# Patient Record
Sex: Female | Born: 1935
Health system: Southern US, Community
[De-identification: ages and names within clinical notes are randomized; demographics above are authoritative.]

## PROBLEM LIST (undated history)

## (undated) DIAGNOSIS — T7840XA Allergy, unspecified, initial encounter: Secondary | ICD-10-CM

## (undated) DIAGNOSIS — I341 Nonrheumatic mitral (valve) prolapse: Secondary | ICD-10-CM

## (undated) DIAGNOSIS — Z8619 Personal history of other infectious and parasitic diseases: Secondary | ICD-10-CM

## (undated) DIAGNOSIS — E871 Hypo-osmolality and hyponatremia: Secondary | ICD-10-CM

## (undated) DIAGNOSIS — G4700 Insomnia, unspecified: Secondary | ICD-10-CM

## (undated) DIAGNOSIS — C44611 Basal cell carcinoma of skin of unspecified upper limb, including shoulder: Secondary | ICD-10-CM

## (undated) DIAGNOSIS — D039 Melanoma in situ, unspecified: Secondary | ICD-10-CM

## (undated) DIAGNOSIS — E2839 Other primary ovarian failure: Secondary | ICD-10-CM

## (undated) DIAGNOSIS — T148XXA Other injury of unspecified body region, initial encounter: Secondary | ICD-10-CM

## (undated) DIAGNOSIS — R011 Cardiac murmur, unspecified: Secondary | ICD-10-CM

## (undated) DIAGNOSIS — Z659 Problem related to unspecified psychosocial circumstances: Secondary | ICD-10-CM

## (undated) DIAGNOSIS — D75839 Thrombocytosis, unspecified: Secondary | ICD-10-CM

## (undated) DIAGNOSIS — E7800 Pure hypercholesterolemia, unspecified: Secondary | ICD-10-CM

## (undated) HISTORY — DX: Hypo-osmolality and hyponatremia: E87.1

## (undated) HISTORY — DX: Personal history of other infectious and parasitic diseases: Z86.19

## (undated) HISTORY — DX: Other injury of unspecified body region, initial encounter: T14.8XXA

## (undated) HISTORY — DX: Insomnia, unspecified: G47.00

## (undated) HISTORY — DX: Other primary ovarian failure: E28.39

## (undated) HISTORY — DX: Pure hypercholesterolemia, unspecified: E78.00

## (undated) HISTORY — DX: Nonrheumatic mitral (valve) prolapse: I34.1

## (undated) HISTORY — PX: TONSILLECTOMY: SUR1361

## (undated) HISTORY — DX: Allergy, unspecified, initial encounter: T78.40XA

## (undated) HISTORY — DX: Basal cell carcinoma of skin of unspecified upper limb, including shoulder: C44.611

## (undated) HISTORY — DX: Cardiac murmur, unspecified: R01.1

## (undated) HISTORY — DX: Thrombocytosis, unspecified: D75.839

## (undated) HISTORY — DX: Melanoma in situ, unspecified: D03.9

## (undated) HISTORY — PX: EYE SURGERY: SHX253

## (undated) HISTORY — DX: Problem related to unspecified psychosocial circumstances: Z65.9

---

## 1984-05-30 HISTORY — PX: DILATION AND CURETTAGE OF UTERUS: SHX78

## 1986-06-28 HISTORY — PX: DILATION AND CURETTAGE OF UTERUS: SHX78

## 1987-05-31 HISTORY — PX: TOTAL ABDOMINAL HYSTERECTOMY W/ BILATERAL SALPINGOOPHORECTOMY: SHX83

## 2000-03-28 ENCOUNTER — Other Ambulatory Visit: Admission: RE | Admit: 2000-03-28 | Discharge: 2000-03-28 | Payer: Self-pay | Admitting: Obstetrics and Gynecology

## 2001-04-06 ENCOUNTER — Encounter: Payer: Self-pay | Admitting: Obstetrics and Gynecology

## 2001-04-06 ENCOUNTER — Encounter: Admission: RE | Admit: 2001-04-06 | Discharge: 2001-04-06 | Payer: Self-pay | Admitting: Obstetrics and Gynecology

## 2004-07-09 ENCOUNTER — Ambulatory Visit (HOSPITAL_COMMUNITY): Admission: RE | Admit: 2004-07-09 | Discharge: 2004-07-09 | Payer: Self-pay | Admitting: Chiropractic Medicine

## 2006-04-29 DIAGNOSIS — C44611 Basal cell carcinoma of skin of unspecified upper limb, including shoulder: Secondary | ICD-10-CM

## 2006-04-29 HISTORY — DX: Basal cell carcinoma of skin of unspecified upper limb, including shoulder: C44.611

## 2007-04-30 DIAGNOSIS — D039 Melanoma in situ, unspecified: Secondary | ICD-10-CM

## 2007-04-30 HISTORY — DX: Melanoma in situ, unspecified: D03.9

## 2007-10-01 ENCOUNTER — Other Ambulatory Visit: Admission: RE | Admit: 2007-10-01 | Discharge: 2007-10-01 | Payer: Self-pay | Admitting: Obstetrics & Gynecology

## 2012-07-09 ENCOUNTER — Other Ambulatory Visit: Payer: Self-pay | Admitting: Gynecology

## 2012-07-15 LAB — HEMOGLOBIN, FINGERSTICK: Hemoglobin, fingerstick: 13.4 g/dL (ref 12.0–16.0)

## 2012-08-10 ENCOUNTER — Ambulatory Visit: Payer: Medicare Other | Attending: Orthopaedic Surgery

## 2012-08-10 DIAGNOSIS — M79609 Pain in unspecified limb: Secondary | ICD-10-CM | POA: Insufficient documentation

## 2012-08-10 DIAGNOSIS — IMO0001 Reserved for inherently not codable concepts without codable children: Secondary | ICD-10-CM | POA: Insufficient documentation

## 2012-08-10 DIAGNOSIS — R5381 Other malaise: Secondary | ICD-10-CM | POA: Insufficient documentation

## 2012-08-10 DIAGNOSIS — M545 Low back pain, unspecified: Secondary | ICD-10-CM | POA: Insufficient documentation

## 2012-08-12 ENCOUNTER — Ambulatory Visit: Payer: Medicare Other

## 2012-08-17 ENCOUNTER — Ambulatory Visit: Payer: Medicare Other

## 2012-08-20 ENCOUNTER — Ambulatory Visit: Payer: Medicare Other | Admitting: Physical Therapy

## 2012-08-24 ENCOUNTER — Ambulatory Visit: Payer: Medicare Other | Admitting: Physical Therapy

## 2012-08-27 ENCOUNTER — Ambulatory Visit: Payer: Medicare Other | Attending: Orthopaedic Surgery | Admitting: Physical Therapy

## 2012-08-27 DIAGNOSIS — IMO0001 Reserved for inherently not codable concepts without codable children: Secondary | ICD-10-CM | POA: Insufficient documentation

## 2012-08-27 DIAGNOSIS — R5381 Other malaise: Secondary | ICD-10-CM | POA: Insufficient documentation

## 2012-08-27 DIAGNOSIS — M545 Low back pain, unspecified: Secondary | ICD-10-CM | POA: Insufficient documentation

## 2012-08-27 DIAGNOSIS — M25569 Pain in unspecified knee: Secondary | ICD-10-CM | POA: Insufficient documentation

## 2012-08-31 ENCOUNTER — Ambulatory Visit: Payer: Medicare Other | Admitting: Physical Therapy

## 2012-09-03 ENCOUNTER — Ambulatory Visit: Payer: Medicare Other | Admitting: Physical Therapy

## 2012-09-07 ENCOUNTER — Ambulatory Visit: Payer: Medicare Other | Admitting: Physical Therapy

## 2012-09-10 ENCOUNTER — Ambulatory Visit: Payer: Medicare Other

## 2012-09-14 ENCOUNTER — Ambulatory Visit: Payer: Medicare Other | Admitting: Physical Therapy

## 2012-09-17 ENCOUNTER — Ambulatory Visit: Payer: Medicare Other

## 2013-07-09 ENCOUNTER — Encounter: Payer: Self-pay | Admitting: Gynecology

## 2013-07-12 ENCOUNTER — Encounter: Payer: Self-pay | Admitting: Gynecology

## 2013-07-12 ENCOUNTER — Ambulatory Visit (INDEPENDENT_AMBULATORY_CARE_PROVIDER_SITE_OTHER): Payer: Medicare Other | Admitting: Gynecology

## 2013-07-12 VITALS — BP 140/90 | HR 74 | Resp 14 | Ht 66.0 in | Wt 167.0 lb

## 2013-07-12 DIAGNOSIS — Z124 Encounter for screening for malignant neoplasm of cervix: Secondary | ICD-10-CM

## 2013-07-12 DIAGNOSIS — Z01419 Encounter for gynecological examination (general) (routine) without abnormal findings: Secondary | ICD-10-CM | POA: Diagnosis not present

## 2013-07-12 NOTE — Progress Notes (Signed)
80 y.o. Widowed  Caucasian female   G0P0000 here for annual exam.  She does not report post-menopasual bleeding due to Hysterectomy.  Pt is without complains she is still doing PT for her back.   Patient's last menstrual period was 05/31/1987.          Sexually active: no  The current method of family planning is status post hysterectomy.    Exercising: no  The patient does not participate in regular exercise at present. Last pap: 10/01/2007 Negative Abnormal PAP: no Mammogram: 12/14/09 BSE: no Colonoscopy: 10/20/08 f/u q 5 years ago DEXA: 02/09/10 normal Alcohol: no Tobacco: no  Labs: Dibas Koirala, MD   Health Maintenance  Topic Date Due  . Tetanus/tdap  05/20/1950  . Colonoscopy  05/20/1981  . Zostavax  05/21/1991  . Pneumococcal Polysaccharide Vaccine Age 11 And Over  05/20/1996  . Influenza Vaccine  11/27/2012    History reviewed. No pertinent family history.  There are no active problems to display for this patient.   Past Medical History  Diagnosis Date  . Melanoma in situ 2009  . BCC (basal cell carcinoma), arm 2008    Left     Past Surgical History  Procedure Laterality Date  . Total abdominal hysterectomy w/ bilateral salpingoophorectomy  05/1987    Leiomyoma  . Dilation and curettage of uterus  05/1984    Prolif  . Dilation and curettage of uterus  06/1986    benign    Allergies: Codeine; Pneumococcal vaccines; Pseudoephedrine; and Tetanus toxoids  Current Outpatient Prescriptions  Medication Sig Dispense Refill  . Ascorbic Acid (VITAMIN C) 1000 MG tablet Take 1,000 mg by mouth daily.      . Calcium Carbonate-Vitamin D (CALCIUM + D PO) Take 1,200 mg by mouth.      . Cholecalciferol (VITAMIN D3) 400 UNITS CAPS Take by mouth.      . Digestive Enzymes (BETAINE HCL) 650-130 MG CAPS Take by mouth.      . Loratadine (CLARITIN PO) Take by mouth as needed.      . Multiple Vitamins-Minerals (MULTIVITAMIN PO) Take by mouth.      . Omega-3 Fatty Acids (FISH OIL)  1200 MG CAPS Take by mouth.       No current facility-administered medications for this visit.    ROS: Pertinent items are noted in HPI.  Exam:    BP 140/90  Pulse 74  Resp 14  Ht 5\' 6"  (1.676 m)  Wt 167 lb (75.751 kg)  BMI 26.97 kg/m2  LMP 05/31/1987 Weight change: @WEIGHTCHANGE @ Last 3 height recordings:  Ht Readings from Last 3 Encounters:  07/12/13 5\' 6"  (1.676 m)   General appearance: alert, cooperative and appears stated age Head: Normocephalic, without obvious abnormality, atraumatic Neck: no adenopathy, no carotid bruit, no JVD, supple, symmetrical, trachea midline and thyroid not enlarged, symmetric, no tenderness/mass/nodules Lungs: clear to auscultation bilaterally Breasts: normal appearance, no masses or tenderness Heart: regular rate and rhythm, S1, S2 normal, no murmur, click, rub or gallop Abdomen: soft, non-tender; bowel sounds normal; no masses,  no organomegaly Extremities: extremities normal, atraumatic, no cyanosis or edema Skin: Skin color, texture, turgor normal. No rashes or lesions Lymph nodes: Cervical, supraclavicular, and axillary nodes normal. no inguinal nodes palpated Neurologic: Grossly normal   Pelvic: External genitalia:  no lesions              Urethra: normal appearing urethra with no masses, tenderness or lesions  Bartholins and Skenes: Bartholin's, Urethra, Skene's normal                 Vagina: atrophic              Cervix: absent              Pap taken: no        Bimanual Exam:  Uterus:  absent                                      Adnexa:    surgically absent bilateral                                      Rectovaginal: Confirms                                      Anus:  defer exam  A: well woman      P: mammogram counseled on breast self exam, mammography screening, menopause, adequate intake of calcium and vitamin D, diet and exercise DEXA [facility] Labs with PCP return annually or prn Discussed PAP guideline  changes, importance of weight bearing exercises, calcium, vit D and balanced diet.  An After Visit Summary was printed and given to the patient.

## 2013-07-12 NOTE — Patient Instructions (Signed)

## 2013-07-19 DIAGNOSIS — Z780 Asymptomatic menopausal state: Secondary | ICD-10-CM | POA: Diagnosis not present

## 2013-07-23 ENCOUNTER — Telehealth: Payer: Self-pay | Admitting: *Deleted

## 2013-07-23 NOTE — Telephone Encounter (Signed)
Patient is calling Cathy Silva back °

## 2013-07-23 NOTE — Telephone Encounter (Signed)
Left Message To Call Back  

## 2013-07-23 NOTE — Telephone Encounter (Signed)
Patient notified of Bmd results; that everything looks great and to continue her diet, calcium and vitamin d as she has been.  Document sent for scanning  Routed to provider, encounter closed.

## 2013-11-10 DIAGNOSIS — D239 Other benign neoplasm of skin, unspecified: Secondary | ICD-10-CM | POA: Diagnosis not present

## 2013-11-10 DIAGNOSIS — D1801 Hemangioma of skin and subcutaneous tissue: Secondary | ICD-10-CM | POA: Diagnosis not present

## 2013-11-10 DIAGNOSIS — Z85820 Personal history of malignant melanoma of skin: Secondary | ICD-10-CM | POA: Diagnosis not present

## 2013-11-10 DIAGNOSIS — L821 Other seborrheic keratosis: Secondary | ICD-10-CM | POA: Diagnosis not present

## 2014-01-26 DIAGNOSIS — Z23 Encounter for immunization: Secondary | ICD-10-CM | POA: Diagnosis not present

## 2014-07-20 ENCOUNTER — Ambulatory Visit (INDEPENDENT_AMBULATORY_CARE_PROVIDER_SITE_OTHER): Payer: Medicare Other | Admitting: Certified Nurse Midwife

## 2014-07-20 ENCOUNTER — Ambulatory Visit: Payer: Self-pay | Admitting: Gynecology

## 2014-07-20 ENCOUNTER — Encounter: Payer: Self-pay | Admitting: Certified Nurse Midwife

## 2014-07-20 VITALS — BP 120/78 | HR 68 | Resp 16 | Ht 64.5 in | Wt 166.0 lb

## 2014-07-20 DIAGNOSIS — Z01419 Encounter for gynecological examination (general) (routine) without abnormal findings: Secondary | ICD-10-CM | POA: Diagnosis not present

## 2014-07-20 DIAGNOSIS — Z124 Encounter for screening for malignant neoplasm of cervix: Secondary | ICD-10-CM | POA: Diagnosis not present

## 2014-07-20 NOTE — Progress Notes (Signed)
81 y.o. G0P0000 Widowed  Caucasian Fe here for annual exam. Menopausal no HRT. Denies vaginal bleeding or vaginal dryness. Sees PCP for aex and labs.  Patient still driving and traveling. Ambulates with out difficulty. No medications other than OTC supplements, limited. No health issues or concerns today. Went to [place] and [place] recently!  Patient's last menstrual period was 05/31/1987.          Sexually active: No.  The current method of family planning is status post hysterectomy.  TAH with BSO  Exercising: No.  exercise Smoker:  no  Health Maintenance: Pap: 10-01-07 neg MMG: 12-14-09 needs now Colonoscopy:  2010 f/u 87yrs BMD:   07-19-13 TDaP:  1982, pt has reaction to lumps Labs: none Self breast exam: not done   reports that she has never smoked. She does not have any smokeless tobacco history on file. She reports that she does not drink alcohol or use illicit drugs.  Past Medical History  Diagnosis Date  . Melanoma in situ 2009  . BCC (basal cell carcinoma), arm 2008    Left     Past Surgical History  Procedure Laterality Date  . Total abdominal hysterectomy w/ bilateral salpingoophorectomy  05/1987    Leiomyoma  . Dilation and curettage of uterus  05/1984    Prolif  . Dilation and curettage of uterus  06/1986    benign    Current Outpatient Prescriptions  Medication Sig Dispense Refill  . Ascorbic Acid (VITAMIN C) 1000 MG tablet Take 1,000 mg by mouth daily.    . Calcium Carbonate-Vitamin D (CALCIUM + D PO) Take 1,200 mg by mouth.    . Digestive Enzymes (BETAINE HCL) 650-130 MG CAPS Take by mouth.    . Loratadine (CLARITIN PO) Take by mouth as needed.    . Multiple Vitamins-Minerals (MULTIVITAMIN PO) Take by mouth.    . Omega-3 Fatty Acids (FISH OIL) 1200 MG CAPS Take by mouth.     No current facility-administered medications for this visit.    History reviewed. No pertinent family history.  ROS:  Pertinent items are noted in HPI.  Otherwise, a comprehensive  ROS was negative.  Exam:   BP 120/78 mmHg  Pulse 68  Resp 16  Ht 5' 4.5" (1.638 m)  Wt 166 lb (75.297 kg)  BMI 28.06 kg/m2  LMP 05/31/1987 Height: 5' 4.5" (163.8 cm) Ht Readings from Last 3 Encounters:  07/20/14 5' 4.5" (1.638 m)  07/12/13 5\' 6"  (1.676 m)    General appearance: alert, cooperative and appears stated age Head: Normocephalic, without obvious abnormality, atraumatic Neck: no adenopathy, supple, symmetrical, trachea midline and thyroid normal to inspection and palpation Lungs: clear to auscultation bilaterally Breasts: normal appearance, no masses or tenderness, No nipple retraction or dimpling, No nipple discharge or bleeding, No axillary or supraclavicular adenopathy Heart: regular rate and rhythm Abdomen: soft, non-tender; no masses,  no organomegaly Extremities: extremities normal, atraumatic, no cyanosis or edema Skin: Skin color, texture, turgor normal. No rashes or lesions Lymph nodes: Cervical, supraclavicular, and axillary nodes normal. No abnormal inguinal nodes palpated Neurologic: Grossly normal   Pelvic: External genitalia:  no lesions              Urethra:  normal appearing urethra with no masses, tenderness or lesions              Bartholin's and Skene's: normal                 Vagina: normal appearing vagina with normal color  and discharge, no lesions, no atrophy              Cervix: absent              Pap taken: No. Bimanual Exam:  Uterus:  uterus absent              Adnexa: no mass, fullness, tenderness and adnexa absent bilateral               Rectovaginal: Confirms               Anus:  normal sphincter tone, no lesions  Chaperone present: Yes  A:  Well Woman with normal exam  Menopausal no HRT S/P TAH BSO for fibroids  P:   Reviewed health and wellness pertinent to exam  Aware if vaginal bleeding or dryness issues to advise  Stressed importance of PCP follow up as needed  Pap smear not  taken today   counseled on breast self exam,  mammography screening, given information to schedule and recommended yearly, patient declined our scheduling for her, adequate intake of calcium and vitamin D, diet and exercise, Kegel's exercises  return annually or prn  An After Visit Summary was printed and given to the patient.

## 2014-07-20 NOTE — Patient Instructions (Signed)

## 2014-07-21 ENCOUNTER — Telehealth: Payer: Self-pay | Admitting: Certified Nurse Midwife

## 2014-07-21 NOTE — Telephone Encounter (Signed)
I recommended she have one this year

## 2014-07-21 NOTE — Telephone Encounter (Signed)
Patient was seen 07/20/14 and was told her last MMG was 2009. Patient says this is incorrect that her last MMG was 2011.

## 2014-07-21 NOTE — Telephone Encounter (Signed)
Spoke with patient. Advised patient of message as seen below from Regina Eck CNM. Patient is agreeable and will call to schedule mammogram at this time. Offered to call and schedule for patient but patient declines at this time.  Routing to provider for final review. Patient agreeable to disposition. Will close encounter

## 2014-07-21 NOTE — Telephone Encounter (Signed)
Spoke with patient. Patient states her most recent mammogram was in 2011 with [facility]. "When I had that one done Dr.Miller told me she would check me every year and if she thought I needed one she would tell me. She has not mentioned it since then so I have not had one. I am not sure if I should still go get one." Advised patient will speak with Cathy Silva CNM and return call with further recommendations. Patient is agreeable.

## 2014-07-27 NOTE — Progress Notes (Signed)
Reviewed personally.  M. Suzanne Jb Dulworth, MD.  

## 2014-08-03 DIAGNOSIS — Z1231 Encounter for screening mammogram for malignant neoplasm of breast: Secondary | ICD-10-CM | POA: Diagnosis not present

## 2014-11-28 DIAGNOSIS — D2262 Melanocytic nevi of left upper limb, including shoulder: Secondary | ICD-10-CM | POA: Diagnosis not present

## 2014-11-28 DIAGNOSIS — L814 Other melanin hyperpigmentation: Secondary | ICD-10-CM | POA: Diagnosis not present

## 2014-11-28 DIAGNOSIS — L570 Actinic keratosis: Secondary | ICD-10-CM | POA: Diagnosis not present

## 2014-11-28 DIAGNOSIS — D1801 Hemangioma of skin and subcutaneous tissue: Secondary | ICD-10-CM | POA: Diagnosis not present

## 2014-11-28 DIAGNOSIS — D2271 Melanocytic nevi of right lower limb, including hip: Secondary | ICD-10-CM | POA: Diagnosis not present

## 2015-02-20 DIAGNOSIS — Z23 Encounter for immunization: Secondary | ICD-10-CM | POA: Diagnosis not present

## 2015-02-21 DIAGNOSIS — H02839 Dermatochalasis of unspecified eye, unspecified eyelid: Secondary | ICD-10-CM | POA: Diagnosis not present

## 2015-02-21 DIAGNOSIS — H2511 Age-related nuclear cataract, right eye: Secondary | ICD-10-CM | POA: Diagnosis not present

## 2015-02-21 DIAGNOSIS — H18412 Arcus senilis, left eye: Secondary | ICD-10-CM | POA: Diagnosis not present

## 2015-02-21 DIAGNOSIS — H18411 Arcus senilis, right eye: Secondary | ICD-10-CM | POA: Diagnosis not present

## 2015-04-17 DIAGNOSIS — H25011 Cortical age-related cataract, right eye: Secondary | ICD-10-CM | POA: Diagnosis not present

## 2015-04-17 DIAGNOSIS — H25811 Combined forms of age-related cataract, right eye: Secondary | ICD-10-CM | POA: Diagnosis not present

## 2015-04-17 DIAGNOSIS — H2511 Age-related nuclear cataract, right eye: Secondary | ICD-10-CM | POA: Diagnosis not present

## 2015-04-18 DIAGNOSIS — H2512 Age-related nuclear cataract, left eye: Secondary | ICD-10-CM | POA: Diagnosis not present

## 2015-05-05 DIAGNOSIS — H5203 Hypermetropia, bilateral: Secondary | ICD-10-CM | POA: Diagnosis not present

## 2015-05-05 DIAGNOSIS — H52223 Regular astigmatism, bilateral: Secondary | ICD-10-CM | POA: Diagnosis not present

## 2015-05-05 DIAGNOSIS — Z961 Presence of intraocular lens: Secondary | ICD-10-CM | POA: Diagnosis not present

## 2015-05-05 DIAGNOSIS — H25812 Combined forms of age-related cataract, left eye: Secondary | ICD-10-CM | POA: Diagnosis not present

## 2015-05-05 DIAGNOSIS — Z9849 Cataract extraction status, unspecified eye: Secondary | ICD-10-CM | POA: Diagnosis not present

## 2015-05-05 DIAGNOSIS — H2513 Age-related nuclear cataract, bilateral: Secondary | ICD-10-CM | POA: Diagnosis not present

## 2015-05-05 DIAGNOSIS — H2512 Age-related nuclear cataract, left eye: Secondary | ICD-10-CM | POA: Diagnosis not present

## 2015-05-07 ENCOUNTER — Encounter (HOSPITAL_COMMUNITY): Payer: Self-pay | Admitting: Emergency Medicine

## 2015-05-07 ENCOUNTER — Emergency Department (HOSPITAL_COMMUNITY): Payer: Medicare Other

## 2015-05-07 ENCOUNTER — Inpatient Hospital Stay (HOSPITAL_COMMUNITY)
Admission: EM | Admit: 2015-05-07 | Discharge: 2015-05-12 | DRG: 641 | Disposition: A | Discharge status: Home / Self Care | Payer: Medicare Other | Attending: Internal Medicine | Admitting: Internal Medicine

## 2015-05-07 DIAGNOSIS — A084 Viral intestinal infection, unspecified: Secondary | ICD-10-CM | POA: Diagnosis present

## 2015-05-07 DIAGNOSIS — Z85820 Personal history of malignant melanoma of skin: Secondary | ICD-10-CM

## 2015-05-07 DIAGNOSIS — Z887 Allergy status to serum and vaccine status: Secondary | ICD-10-CM

## 2015-05-07 DIAGNOSIS — R531 Weakness: Secondary | ICD-10-CM | POA: Diagnosis not present

## 2015-05-07 DIAGNOSIS — Z9842 Cataract extraction status, left eye: Secondary | ICD-10-CM

## 2015-05-07 DIAGNOSIS — M4854XA Collapsed vertebra, not elsewhere classified, thoracic region, initial encounter for fracture: Secondary | ICD-10-CM | POA: Diagnosis present

## 2015-05-07 DIAGNOSIS — K449 Diaphragmatic hernia without obstruction or gangrene: Secondary | ICD-10-CM | POA: Diagnosis present

## 2015-05-07 DIAGNOSIS — I10 Essential (primary) hypertension: Secondary | ICD-10-CM

## 2015-05-07 DIAGNOSIS — R0602 Shortness of breath: Secondary | ICD-10-CM | POA: Diagnosis not present

## 2015-05-07 DIAGNOSIS — K224 Dyskinesia of esophagus: Secondary | ICD-10-CM | POA: Diagnosis present

## 2015-05-07 DIAGNOSIS — M549 Dorsalgia, unspecified: Secondary | ICD-10-CM

## 2015-05-07 DIAGNOSIS — R112 Nausea with vomiting, unspecified: Secondary | ICD-10-CM | POA: Diagnosis not present

## 2015-05-07 DIAGNOSIS — Z886 Allergy status to analgesic agent status: Secondary | ICD-10-CM

## 2015-05-07 DIAGNOSIS — R1319 Other dysphagia: Secondary | ICD-10-CM | POA: Diagnosis present

## 2015-05-07 DIAGNOSIS — Z90722 Acquired absence of ovaries, bilateral: Secondary | ICD-10-CM

## 2015-05-07 DIAGNOSIS — R404 Transient alteration of awareness: Secondary | ICD-10-CM | POA: Diagnosis not present

## 2015-05-07 DIAGNOSIS — E871 Hypo-osmolality and hyponatremia: Principal | ICD-10-CM | POA: Diagnosis present

## 2015-05-07 DIAGNOSIS — Z8249 Family history of ischemic heart disease and other diseases of the circulatory system: Secondary | ICD-10-CM

## 2015-05-07 DIAGNOSIS — R911 Solitary pulmonary nodule: Secondary | ICD-10-CM | POA: Diagnosis present

## 2015-05-07 DIAGNOSIS — Z90710 Acquired absence of both cervix and uterus: Secondary | ICD-10-CM

## 2015-05-07 DIAGNOSIS — Z888 Allergy status to other drugs, medicaments and biological substances status: Secondary | ICD-10-CM

## 2015-05-07 DIAGNOSIS — Z79899 Other long term (current) drug therapy: Secondary | ICD-10-CM

## 2015-05-07 DIAGNOSIS — R1310 Dysphagia, unspecified: Secondary | ICD-10-CM

## 2015-05-07 DIAGNOSIS — E222 Syndrome of inappropriate secretion of antidiuretic hormone: Secondary | ICD-10-CM | POA: Diagnosis not present

## 2015-05-07 DIAGNOSIS — Z9841 Cataract extraction status, right eye: Secondary | ICD-10-CM

## 2015-05-07 DIAGNOSIS — R1110 Vomiting, unspecified: Secondary | ICD-10-CM | POA: Diagnosis not present

## 2015-05-07 DIAGNOSIS — K210 Gastro-esophageal reflux disease with esophagitis: Secondary | ICD-10-CM | POA: Diagnosis present

## 2015-05-07 LAB — I-STAT CHEM 8, ED
BUN: 7 mg/dL (ref 6–20)
CHLORIDE: 90 mmol/L — AB (ref 101–111)
CREATININE: 0.4 mg/dL — AB (ref 0.44–1.00)
Calcium, Ion: 0.99 mmol/L — ABNORMAL LOW (ref 1.13–1.30)
GLUCOSE: 125 mg/dL — AB (ref 65–99)
HCT: 43 % (ref 36.0–46.0)
Hemoglobin: 14.6 g/dL (ref 12.0–15.0)
POTASSIUM: 3.5 mmol/L (ref 3.5–5.1)
Sodium: 124 mmol/L — ABNORMAL LOW (ref 135–145)
TCO2: 21 mmol/L (ref 0–100)

## 2015-05-07 LAB — BASIC METABOLIC PANEL
ANION GAP: 11 (ref 5–15)
Anion gap: 14 (ref 5–15)
BUN: 7 mg/dL (ref 6–20)
BUN: 7 mg/dL (ref 6–20)
CHLORIDE: 88 mmol/L — AB (ref 101–111)
CHLORIDE: 93 mmol/L — AB (ref 101–111)
CO2: 19 mmol/L — AB (ref 22–32)
CO2: 20 mmol/L — ABNORMAL LOW (ref 22–32)
Calcium: 8.7 mg/dL — ABNORMAL LOW (ref 8.9–10.3)
Calcium: 8.4 mg/dL — ABNORMAL LOW (ref 8.9–10.3)
Creatinine, Ser: 0.52 mg/dL (ref 0.44–1.00)
Creatinine, Ser: 0.61 mg/dL (ref 0.44–1.00)
GFR calc non Af Amer: >60 mL/min (ref >60)
GFR calc non Af Amer: >60 mL/min (ref >60)
Glucose, Bld: 124 mg/dL — ABNORMAL HIGH (ref 65–99)
Glucose, Bld: 117 mg/dL — ABNORMAL HIGH (ref 65–99)
POTASSIUM: 3.6 mmol/L (ref 3.5–5.1)
POTASSIUM: 3.6 mmol/L (ref 3.5–5.1)
SODIUM: 121 mmol/L — AB (ref 135–145)
SODIUM: 124 mmol/L — AB (ref 135–145)

## 2015-05-07 LAB — CBC
HEMATOCRIT: 37.7 % (ref 36.0–46.0)
Hemoglobin: 12.9 g/dL (ref 12.0–15.0)
MCH: 27.4 pg (ref 26.0–34.0)
MCHC: 34.2 g/dL (ref 30.0–36.0)
MCV: 80.2 fL (ref 78.0–100.0)
Platelets: 379 10*3/uL (ref 150–400)
RBC: 4.7 MIL/uL (ref 3.87–5.11)
RDW: 13.1 % (ref 11.5–15.5)
WBC: 11.8 10*3/uL — AB (ref 4.0–10.5)

## 2015-05-07 LAB — HEPATIC FUNCTION PANEL
ALT: 15 U/L (ref 14–54)
AST: 21 U/L (ref 15–41)
Albumin: 3.4 g/dL — ABNORMAL LOW (ref 3.5–5.0)
Alkaline Phosphatase: 71 U/L (ref 38–126)
Bilirubin, Direct: 0.2 mg/dL (ref 0.1–0.5)
Indirect Bilirubin: 0.7 mg/dL (ref 0.3–0.9)
Total Bilirubin: 0.9 mg/dL (ref 0.3–1.2)
Total Protein: 6.9 g/dL (ref 6.5–8.1)

## 2015-05-07 LAB — URINE MICROSCOPIC-ADD ON
BACTERIA UA: NONE SEEN
WBC UA: NONE SEEN WBC/hpf (ref 0–5)

## 2015-05-07 LAB — URINALYSIS, ROUTINE W REFLEX MICROSCOPIC
Bilirubin Urine: NEGATIVE
Glucose, UA: NEGATIVE mg/dL
KETONES UR: 40 mg/dL — AB
LEUKOCYTES UA: NEGATIVE
NITRITE: NEGATIVE
PH: 7 (ref 5.0–8.0)
Protein, ur: 100 mg/dL — AB
SPECIFIC GRAVITY, URINE: 1.029 (ref 1.005–1.030)

## 2015-05-07 LAB — I-STAT TROPONIN, ED: Troponin i, poc: 0 ng/mL (ref 0.00–0.08)

## 2015-05-07 LAB — LIPASE, BLOOD: Lipase: 22 U/L (ref 11–51)

## 2015-05-07 LAB — I-STAT CG4 LACTIC ACID, ED
Lactic Acid, Venous: 1.7 mmol/L (ref 0.5–2.0)
Lactic Acid, Venous: 0.84 mmol/L (ref 0.5–2.0)

## 2015-05-07 MED ORDER — IOHEXOL 350 MG/ML SOLN
80.0000 mL | Freq: Once | INTRAVENOUS | Status: AC | PRN
  Administered 2015-05-07: 80 mL via INTRAVENOUS

## 2015-05-07 MED ORDER — ONDANSETRON HCL 4 MG PO TABS
4.0000 mg | ORAL_TABLET | Freq: Four times a day (QID) | ORAL | Status: DC | PRN
  Administered 2015-05-07 – 2015-05-08 (×2): 4 mg via ORAL
  Filled 2015-05-07 (×2): qty 1

## 2015-05-07 MED ORDER — BETAINE HCL 650-130 MG PO CAPS: 1.0000 | ORAL_CAPSULE | Freq: Every day | ORAL | Status: DC

## 2015-05-07 MED ORDER — RISAQUAD PO CAPS
2.0000 | ORAL_CAPSULE | Freq: Every day | ORAL | Status: DC
  Administered 2015-05-07 – 2015-05-12 (×6): 2 via ORAL
  Filled 2015-05-07 (×6): qty 2

## 2015-05-07 MED ORDER — FENTANYL CITRATE (PF) 100 MCG/2ML IJ SOLN
50.0000 ug | Freq: Once | INTRAMUSCULAR | Status: AC
  Administered 2015-05-07: 50 ug via INTRAVENOUS
  Filled 2015-05-07: qty 2

## 2015-05-07 MED ORDER — SODIUM CHLORIDE 0.9 % IV SOLN: Freq: Once | INTRAVENOUS | Status: DC

## 2015-05-07 MED ORDER — SODIUM CHLORIDE 0.9 % IV BOLUS (SEPSIS)
1000.0000 mL | Freq: Once | INTRAVENOUS | Status: AC
  Administered 2015-05-07: 1000 mL via INTRAVENOUS

## 2015-05-07 MED ORDER — ONDANSETRON HCL 4 MG/2ML IJ SOLN
4.0000 mg | Freq: Four times a day (QID) | INTRAMUSCULAR | Status: DC | PRN
  Administered 2015-05-10: 4 mg via INTRAVENOUS
  Filled 2015-05-07: qty 2

## 2015-05-07 MED ORDER — PROMETHAZINE HCL 25 MG/ML IJ SOLN
12.5000 mg | Freq: Once | INTRAMUSCULAR | Status: AC
  Administered 2015-05-07: 12.5 mg via INTRAVENOUS
  Filled 2015-05-07: qty 1

## 2015-05-07 MED ORDER — ONDANSETRON HCL 4 MG/2ML IJ SOLN
4.0000 mg | Freq: Once | INTRAMUSCULAR | Status: AC
  Administered 2015-05-07: 4 mg via INTRAVENOUS
  Filled 2015-05-07: qty 2

## 2015-05-07 MED ORDER — ENOXAPARIN SODIUM 40 MG/0.4ML ~~LOC~~ SOLN
40.0000 mg | SUBCUTANEOUS | Status: DC
  Administered 2015-05-07 – 2015-05-11 (×5): 40 mg via SUBCUTANEOUS
  Filled 2015-05-07 (×5): qty 0.4

## 2015-05-07 MED ORDER — HYDRALAZINE HCL 20 MG/ML IJ SOLN
5.0000 mg | INTRAMUSCULAR | Status: DC | PRN
  Administered 2015-05-08: 5 mg via INTRAVENOUS
  Filled 2015-05-07 (×2): qty 1

## 2015-05-07 MED ORDER — ACETAMINOPHEN 325 MG PO TABS
650.0000 mg | ORAL_TABLET | Freq: Four times a day (QID) | ORAL | Status: DC | PRN
  Administered 2015-05-08 – 2015-05-10 (×5): 650 mg via ORAL
  Filled 2015-05-07 (×6): qty 2

## 2015-05-07 MED ORDER — SODIUM CHLORIDE 0.9 % IJ SOLN
3.0000 mL | Freq: Two times a day (BID) | INTRAMUSCULAR | Status: DC
  Administered 2015-05-07 – 2015-05-12 (×8): 3 mL via INTRAVENOUS

## 2015-05-07 MED ORDER — METHOCARBAMOL 500 MG PO TABS
500.0000 mg | ORAL_TABLET | Freq: Three times a day (TID) | ORAL | Status: DC | PRN
  Administered 2015-05-07 – 2015-05-09 (×2): 500 mg via ORAL
  Filled 2015-05-07 (×2): qty 1

## 2015-05-07 MED ORDER — SODIUM CHLORIDE 0.9 % IV SOLN
INTRAVENOUS | Status: DC
  Administered 2015-05-07 – 2015-05-09 (×2): via INTRAVENOUS

## 2015-05-07 MED ORDER — ACETAMINOPHEN 650 MG RE SUPP: 650.0000 mg | Freq: Four times a day (QID) | RECTAL | Status: DC | PRN

## 2015-05-07 NOTE — ED Notes (Signed)
Pt comes from home via [facility] c/o mid back pain since Wednesday.  Pt reports new dizziness with SOB and nausea last night.  Pt denies LOC.  Pt reports 1 episode diarrhea during the night.  Pt ambulatory.

## 2015-05-07 NOTE — ED Provider Notes (Signed)
CSN: [id number]     Arrival date & time 05/07/15  0906 History   First MD Initiated Contact with Patient 05/07/15 [phone]     Chief Complaint  Patient presents with  . Back Pain    HPI    82 year old female presents today with complaints of back pain, nausea, vomiting and dizziness. Patient reports back pain started 5 days ago in her back; unable to pinpoint location. This pain started when getting into bed, she had visited her chiropractor the following with some improvement in symptoms . She notes over the last 2 days pain has significantly worsened with the addition of nausea and vomiting today. Patient reports she was able to drink orange juice and a yoga this morning but waves of nausea and vomiting have progressively gotten worse and with associated dizziness and weakness. Patient denies any abdominal pain, reports one episode of diarrhea throughout the night,. She denies any fever, chills, chest pain or shortness of breath, cough. Patient denies any significant history of hypertension or any other chronic conditions. Patient most recently had a cataract surgery done on January 6( 2 days ago) with an addition of eyedrop medications. Patient is a nonsmoker, no history of smoking.   Past Medical History  Diagnosis Date  . Melanoma in situ ([place]) 2009  . BCC (basal cell carcinoma), arm 2008    Left    Past Surgical History  Procedure Laterality Date  . Total abdominal hysterectomy w/ bilateral salpingoophorectomy  05/1987    Leiomyoma  . Dilation and curettage of uterus  05/1984    Prolif  . Dilation and curettage of uterus  06/1986    benign  . Eye surgery      bilat cataract  . Tonsillectomy     Family History  Problem Relation Age of Onset  . Heart attack Maternal Grandmother    Social History  Substance Use Topics  . Smoking status: Never Smoker   . Smokeless tobacco: None  . Alcohol Use: No   OB History    Gravida Para Term Preterm AB TAB SAB Ectopic Multiple Living   0 0 0  0 0 0 0 0 0 0     Review of Systems  All other systems reviewed and are negative.   Allergies  Codeine; Other; Pneumococcal vaccines; Pseudoephedrine; and Tetanus toxoids  Home Medications   Prior to Admission medications   Medication Sig Start Date End Date Taking? Authorizing Provider  Ascorbic Acid (VITAMIN C) 1000 MG tablet Take 1,000 mg by mouth daily.    Historical Provider, MD  Calcium Carbonate-Vitamin D (CALCIUM + D PO) Take 1,200 mg by mouth.    Historical Provider, MD  Digestive Enzymes (BETAINE HCL) 650-130 MG CAPS Take by mouth.    Historical Provider, MD  Loratadine (CLARITIN PO) Take by mouth as needed.    Historical Provider, MD  Multiple Vitamins-Minerals (MULTIVITAMIN PO) Take by mouth.    Historical Provider, MD  Omega-3 Fatty Acids (FISH OIL) 1200 MG CAPS Take by mouth.    Historical Provider, MD   BP 170/71 mmHg  Pulse 90  Temp(Src) 98.3 F (36.8 C) (Oral)  Resp 18  Ht 5\' 6"  (1.676 m)  Wt 73.029 kg  BMI 26.00 kg/m2  SpO2 96%  LMP 05/31/1987   Physical Exam  Constitutional: She is oriented to person, place, and time. She appears well-developed and well-nourished. She appears distressed.  HENT:  Head: Normocephalic and atraumatic.  Eyes: Conjunctivae are normal. Pupils are equal, round, and reactive  to light. Right eye exhibits no discharge. Left eye exhibits no discharge. No scleral icterus.  Neck: Normal range of motion. No JVD present. No tracheal deviation present.  Cardiovascular: Normal rate, regular rhythm, normal heart sounds and intact distal pulses.  Exam reveals no gallop and no friction rub.   No murmur heard. Pulmonary/Chest: Effort normal and breath sounds normal. No stridor. No respiratory distress. She has no wheezes. She has no rales. She exhibits no tenderness.  Radial pulses 2+ and equal, pedal pulses 2+ and equal  Abdominal: She exhibits no distension and no mass. There is no tenderness. There is no rebound and no guarding.   Musculoskeletal: She exhibits no edema or tenderness.  No significant tenderness to palpation of the CT or L-spine or surrounding soft tissues. No signs of abscess, redness, warmth to touch, or signs of trauma. Patient's back pain made worse with lying flat and sitting forward.  Neurological: She is alert and oriented to person, place, and time. Coordination normal.  Skin: Skin is warm and dry. No rash noted. No erythema. No pallor.  Psychiatric: She has a normal mood and affect. Her behavior is normal. Judgment and thought content normal.  Nursing note and vitals reviewed.   ED Course  Procedures (including critical care time) Labs Review Labs Reviewed  BASIC METABOLIC PANEL - Abnormal; Notable for the following:    Sodium 121 (*)    Chloride 88 (*)    CO2 19 (*)    Glucose, Bld 124 (*)    Calcium 8.7 (*)    All other components within normal limits  CBC - Abnormal; Notable for the following:    WBC 11.8 (*)    All other components within normal limits  HEPATIC FUNCTION PANEL - Abnormal; Notable for the following:    Albumin 3.4 (*)    All other components within normal limits  I-STAT CHEM 8, ED - Abnormal; Notable for the following:    Sodium 124 (*)    Chloride 90 (*)    Creatinine, Ser 0.40 (*)    Glucose, Bld 125 (*)    Calcium, Ion 0.99 (*)    All other components within normal limits  LIPASE, BLOOD  I-STAT TROPOININ, ED  I-STAT CG4 LACTIC ACID, ED  I-STAT CG4 LACTIC ACID, ED    Imaging Review Dg Chest 2 View  05/07/2015  CLINICAL DATA:  Mid back pain since Wednesday. New dizziness was shortness of breath and some nausea last night. EXAM: CHEST  2 VIEW COMPARISON:  Chest CT performed earlier same day. FINDINGS: Heart size is upper normal. Overall cardiomediastinal silhouette is within normal limits in size and configuration, with associated age-related ectasia of the thoracic aorta. Mild scarring versus atelectasis at each lung base, better seen on earlier chest CT.  Lungs otherwise clear. No confluent opacity to suggest a developing pneumonia. No pleural effusions seen. No pneumothorax seen. There is a compression fracture deformity of a mid thoracic vertebral body, labeled T7 on earlier CT, of uncertain age but most likely chronic. There is also a mild dextroscoliosis of the lower thoracic spine. Mild degenerative change noted within the thoracic spine. No acute-appearing osseous abnormality. IMPRESSION: Mild scarring versus atelectasis at each lung base, better seen on earlier chest CT. Lungs are otherwise clear and there is no evidence of acute cardiopulmonary abnormality. Compression fracture deformity of the T7 vertebral body, of uncertain age, but most likely old, also better seen on earlier CT. Electronically Signed   By: Roxy Horseman.D.  On: 05/07/2015 12:24   Ct Angio Abdomen W/cm &/or Wo Contrast  05/07/2015  CLINICAL DATA:  82 year old female with mid back pain, hypertension and nausea/vomiting EXAM: CT ANGIOGRAPHY CHEST, ABDOMEN AND PELVIS TECHNIQUE: Multidetector CT imaging through the chest, abdomen and pelvis was performed using the standard protocol during bolus administration of intravenous contrast. Multiplanar reconstructed images and MIPs were obtained and reviewed to evaluate the vascular anatomy. CONTRAST:  59mL OMNIPAQUE IOHEXOL 350 MG/ML SOLN COMPARISON:  Prior MRI lumbar spine 04/18/2012 FINDINGS: CTA CHEST FINDINGS Mediastinum: Unremarkable CT appearance of the thyroid gland. No suspicious mediastinal or hilar adenopathy. No soft tissue mediastinal mass. The thoracic esophagus is unremarkable. Heart/Vascular: No evidence of acute intramural hematoma on the initial non contrasted images. Following the administration of intravenous contrast. There is a conventional 3 vessel arch morphology. No evidence of aneurysm or acute dissection. Mild atherosclerotic vascular calcifications without significant stenosis. Calcifications present along the course  of the left anterior descending, circumflex and right coronary arteries. Concentric hypertrophy of the left ventricle. The heart is at the upper limits of normal for size. No pericardial effusion. Normal caliber main and central pulmonary arteries. No evidence of acute pulmonary embolus. Lungs/Pleura: 5-6 mm nodule in the superior aspect of the right middle lobe (image 27 series 5). Adjacent 3 mm ground-glass attenuation nodule on the same image. 4 mm nodule slightly more inferior on image 31. Dependent atelectasis in both lower lobes. 3 mm ground-glass attenuation nodular opacity in the left lung apex on image 7 of series 5. Probable 5 mm pulmonary nodule in the left lower lobe (image 45 series 5). No evidence of focal airspace consolidation, pulmonary edema, pleural effusion or pneumothorax. Bones/Soft Tissues: No acute fracture or aggressive appearing lytic or blastic osseous lesion. T7 compression fracture with approximately 50% height loss anteriorly. Review of the MIP images confirms the above findings. CTA ABDOMEN AND PELVIS FINDINGS VASCULAR Aorta: Tortuous but normal caliber abdominal aorta. No evidence of acute dissection. Scattered calcified atherosclerotic plaque. No significant or irregular mural thrombus. Celiac: Variant anatomy. The splenic and left gastric artery arise from a common trunk. The proper hepatic artery arises independently from the aorta. Atherosclerotic plaque at the origin of both vessels results in at least mild narrowing. No visceral artery aneurysm. SMA: Widely patent and unremarkable. Renals: Single dominant renal arteries bilaterally which are widely patent without evidence of fibromuscular dysplasia. Accessory renal artery to the lower pole of the left kidney. IMA: Atherosclerotic plaque results in at least mild narrowing of the proximal vessel. The distal vessel remains patent. Inflow: Tortuous and mildly ectatic but no aneurysm, dissection or high-grade stenosis. Proximal  Outflow: Visualized portions are relatively disease free. Veins: No focal venous abnormality. NON-VASCULAR Abdomen: Moderate respiratory motion artifact in the upper abdomen. Unremarkable CT appearance of the stomach, duodenum, spleen, adrenal glands and pancreas. Normal hepatic contour and morphology. No discrete hepatic lesion. Gallbladder is unremarkable. No intra or extrahepatic biliary ductal dilatation. Unremarkable appearance of the bilateral kidneys. No focal solid lesion, hydronephrosis or nephrolithiasis. Colonic diverticular disease without CT evidence of active inflammation. The colon is decompressed. No focal bowel wall thickening or evidence of obstruction. No free fluid or suspicious adenopathy. Pelvis: The uterus is surgically absent. The bladder is dilated with urine. No free fluid or suspicious adenopathy. Bones/Soft Tissues: The bones appear diffusely demineralized. There is no evidence of acute fracture, malalignment or aggressive lytic or blastic osseous lesion. Advanced multilevel degenerative disc disease. Lower lumbar facet arthropathy. L1 and L2 compression fractures without  significant interval change compared to prior MRI. Review of the MIP images confirms the above findings. IMPRESSION: CTA CHEST 1. No acute dissection, aneurysm or other evidence of acute aortic syndrome. 2. Atherosclerotic vascular calcifications including 3 vessel coronary artery calcifications. 3. Concentric hypertrophy of the left ventricle. Does the patient have a clinical history of systemic arterial hypertension? 4. Multiple pulmonary nodules measuring up to 5 mm. While these are highly likely (in the absence of a known primary malignancy. If the patient has a known primary malignancy then metastatic disease would be more likely) the sequelae of a prior infectious/inflammatory or granulomatous process, stability cannot be confirmed without prior imaging for comparison. Consider follow-up CT scan of the chest in  6-12 months if the documentation of stability is clinically warranted. 5. Age indeterminate (without prior imaging for comparison) but likely chronic T7 compression fracture. CTA ABD/PELVIS 1. Moderate respiratory/patient motion artifact. 2. No evidence of acute aortic dissection, aneurysm or other acute vascular abnormality. 3. Incidental note is made of variant anatomy of the celiac artery origin. 4. Atherosclerotic vascular calcifications without significant stenosis to suggest a source for chronic mesenteric ischemia. 5. Colonic diverticular disease without CT evidence of active inflammation. 6. Chronic L1 and L2 compression fractures without evidence of progressive height loss compared to 04/18/2012. Signed, Criselda Peaches, MD [facility] Radiology Electronically Signed   By: Jacqulynn Cadet M.D.   On: 05/07/2015 11:40   Ct Angio Chest Aorta W/cm &/or Wo/cm  05/07/2015  CLINICAL DATA:  82 year old female with mid back pain, hypertension and nausea/vomiting EXAM: CT ANGIOGRAPHY CHEST, ABDOMEN AND PELVIS TECHNIQUE: Multidetector CT imaging through the chest, abdomen and pelvis was performed using the standard protocol during bolus administration of intravenous contrast. Multiplanar reconstructed images and MIPs were obtained and reviewed to evaluate the vascular anatomy. CONTRAST:  57mL OMNIPAQUE IOHEXOL 350 MG/ML SOLN COMPARISON:  Prior MRI lumbar spine 04/18/2012 FINDINGS: CTA CHEST FINDINGS Mediastinum: Unremarkable CT appearance of the thyroid gland. No suspicious mediastinal or hilar adenopathy. No soft tissue mediastinal mass. The thoracic esophagus is unremarkable. Heart/Vascular: No evidence of acute intramural hematoma on the initial non contrasted images. Following the administration of intravenous contrast. There is a conventional 3 vessel arch morphology. No evidence of aneurysm or acute dissection. Mild atherosclerotic vascular calcifications  without significant stenosis. Calcifications present along the course of the left anterior descending, circumflex and right coronary arteries. Concentric hypertrophy of the left ventricle. The heart is at the upper limits of normal for size. No pericardial effusion. Normal caliber main and central pulmonary arteries. No evidence of acute pulmonary embolus. Lungs/Pleura: 5-6 mm nodule in the superior aspect of the right middle lobe (image 27 series 5). Adjacent 3 mm ground-glass attenuation nodule on the same image. 4 mm nodule slightly more inferior on image 31. Dependent atelectasis in both lower lobes. 3 mm ground-glass attenuation nodular opacity in the left lung apex on image 7 of series 5. Probable 5 mm pulmonary nodule in the left lower lobe (image 45 series 5). No evidence of focal airspace consolidation, pulmonary edema, pleural effusion or pneumothorax. Bones/Soft Tissues: No acute fracture or aggressive appearing lytic or blastic osseous lesion. T7 compression fracture with approximately 50% height loss anteriorly. Review of the MIP images confirms the above findings. CTA ABDOMEN AND PELVIS FINDINGS VASCULAR Aorta: Tortuous but normal caliber abdominal aorta. No evidence of acute dissection. Scattered calcified atherosclerotic plaque. No significant or irregular mural thrombus. Celiac: Variant anatomy. The splenic and left gastric artery  arise from a common trunk. The proper hepatic artery arises independently from the aorta. Atherosclerotic plaque at the origin of both vessels results in at least mild narrowing. No visceral artery aneurysm. SMA: Widely patent and unremarkable. Renals: Single dominant renal arteries bilaterally which are widely patent without evidence of fibromuscular dysplasia. Accessory renal artery to the lower pole of the left kidney. IMA: Atherosclerotic plaque results in at least mild narrowing of the proximal vessel. The distal vessel remains patent. Inflow: Tortuous and mildly  ectatic but no aneurysm, dissection or high-grade stenosis. Proximal Outflow: Visualized portions are relatively disease free. Veins: No focal venous abnormality. NON-VASCULAR Abdomen: Moderate respiratory motion artifact in the upper abdomen. Unremarkable CT appearance of the stomach, duodenum, spleen, adrenal glands and pancreas. Normal hepatic contour and morphology. No discrete hepatic lesion. Gallbladder is unremarkable. No intra or extrahepatic biliary ductal dilatation. Unremarkable appearance of the bilateral kidneys. No focal solid lesion, hydronephrosis or nephrolithiasis. Colonic diverticular disease without CT evidence of active inflammation. The colon is decompressed. No focal bowel wall thickening or evidence of obstruction. No free fluid or suspicious adenopathy. Pelvis: The uterus is surgically absent. The bladder is dilated with urine. No free fluid or suspicious adenopathy. Bones/Soft Tissues: The bones appear diffusely demineralized. There is no evidence of acute fracture, malalignment or aggressive lytic or blastic osseous lesion. Advanced multilevel degenerative disc disease. Lower lumbar facet arthropathy. L1 and L2 compression fractures without significant interval change compared to prior MRI. Review of the MIP images confirms the above findings. IMPRESSION: CTA CHEST 1. No acute dissection, aneurysm or other evidence of acute aortic syndrome. 2. Atherosclerotic vascular calcifications including 3 vessel coronary artery calcifications. 3. Concentric hypertrophy of the left ventricle. Does the patient have a clinical history of systemic arterial hypertension? 4. Multiple pulmonary nodules measuring up to 5 mm. While these are highly likely (in the absence of a known primary malignancy. If the patient has a known primary malignancy then metastatic disease would be more likely) the sequelae of a prior infectious/inflammatory or granulomatous process, stability cannot be confirmed without prior  imaging for comparison. Consider follow-up CT scan of the chest in 6-12 months if the documentation of stability is clinically warranted. 5. Age indeterminate (without prior imaging for comparison) but likely chronic T7 compression fracture. CTA ABD/PELVIS 1. Moderate respiratory/patient motion artifact. 2. No evidence of acute aortic dissection, aneurysm or other acute vascular abnormality. 3. Incidental note is made of variant anatomy of the celiac artery origin. 4. Atherosclerotic vascular calcifications without significant stenosis to suggest a source for chronic mesenteric ischemia. 5. Colonic diverticular disease without CT evidence of active inflammation. 6. Chronic L1 and L2 compression fractures without evidence of progressive height loss compared to 04/18/2012. Signed, Criselda Peaches, MD [facility] Radiology Electronically Signed   By: Jacqulynn Cadet M.D.   On: 05/07/2015 11:40   I have personally reviewed and evaluated these images and lab results as part of my medical decision-making.   EKG Interpretation   Date/Time:  Sunday May 07 2015 09:15:03 EST Ventricular Rate:  83 PR Interval:  188 QRS Duration: 151 QT Interval:  413 QTC Calculation: 485 R Axis:   87 Text Interpretation:  Sinus rhythm Probable left atrial enlargement Right  bundle branch block No old tracing to compare Confirmed by RAY MD,  Andee Poles [phone]) on 05/07/2015 12:56:41 PM      MDM   Final diagnoses:  Hyponatremia    Labs: I-STAT lactic acid, i-STAT Chem-8, i-STAT troponin,  BMP, CBC, hepatic function, lipase- sodium of 121  Imaging: ED EKG, CT angio chest and abdomen  Consults:  Therapeutics: Zofran, promethazine, fentanyl  Discharge Meds:   Assessment/Plan:  82 year old female presents today severe back pain, nausea vomiting. Initial concern for dissection, CT angiogram chest abdomen showed no signs of dissection, some incidental findings  of pulmonary nodules and hypertrophy of the left ventricle. Patient was treated with pain medication, and Zofran, promethazine for nausea. Patient's pain was managed, nausea or vomiting managed after second round of antinausea medication. Patient was found to have a sodium of 121, she does seem improved over initial presentation, but she will likely need observation and gentle fluid hydration with repeat labs tomorrow. Patient remained hypertensive throughout her stay, with reduction in her systolic blood pressure in the 180s at the time of final reevaluation.      Okey Regal, PA-C 05/07/15 [place], PA-C 05/07/15 1439  Pattricia Boss, MD 05/07/15 1535

## 2015-05-07 NOTE — Progress Notes (Signed)
Paged and text MD twice. Awaiting for response.

## 2015-05-07 NOTE — ED Notes (Signed)
Dr. Jeanell Sparrow MD and Margretta Ditty, PA, at bedside.

## 2015-05-07 NOTE — H&P (Signed)
[facility] History and Physical  Cathy Silva [id number] DOB: 19-Feb-1930 DOA: 05/07/2015  Referring physician: Dr Penni Bombard - [facility] PCP: Cathy Amel, MD   Chief Complaint: Back pain  HPI: Cathy Silva is a 82 y.o. female  Back pain. Acute onset. Started 5 days ago this patient was getting into her bed. Mid back without radiation. Denies any loss of function in her lower extremities or loss of bowel or bladder function. Fairly constant with waxing and waning nature. Worse with certain movements. Denies any dysuria or frequency. Denies fevers. Associate with nausea. Diarrhea 1 one day ago. Vomiting 1 in ED that was nonbilious non-bloody. 200 mg of ibuprofen daily without improvement. 400 mg of ibuprofen 1 on day of admission with some improvement.    Review of Systems:  Constitutional:  No weight loss, night sweats, Fevers, chills, fatigue.  HEENT:  No headaches, Difficulty swallowing,Tooth/dental problems,Sore throat, Cardio-vascular:  No chest pain, Orthopnea, PND, swelling in lower extremities, anasarca, dizziness, palpitations  GI: Per HPi Resp:   No shortness of breath with exertion or at rest. No excess mucus, no productive cough, No non-productive cough, No coughing up of blood.No change in color of mucus.No wheezing.No chest wall deformity  Skin:  no rash or lesions.  GU:  no dysuria, change in color of urine, no urgency or frequency. No flank pain.  Musculoskeletal:  Per HPI Psych:  No change in mood or affect. No depression or anxiety. No memory loss.  Neuro:  No change in sensation, unilateral strength, or cognitive abilities  All other systems were reviewed and are negative.  Past Medical History  Diagnosis Date  . Melanoma in situ ([place]) 2009  . BCC (basal cell carcinoma), arm 2008    Left    Past Surgical History  Procedure Laterality Date  . Total abdominal hysterectomy w/ bilateral salpingoophorectomy  05/1987    Leiomyoma  . Dilation  and curettage of uterus  05/1984    Prolif  . Dilation and curettage of uterus  06/1986    benign  . Eye surgery      bilat cataract  . Tonsillectomy     Social History:  reports that she has never smoked. She does not have any smokeless tobacco history on file. She reports that she does not drink alcohol or use illicit drugs.  Allergies  Allergen Reactions  . Codeine Other (See Comments)    Unknown reaction  . Other     novacane  . Pneumococcal Vaccines Other (See Comments)    Unknown reaction  . Pseudoephedrine Other (See Comments)    Unknown reaction  . Tetanus Toxoids     Swelling at injecton site    Family History  Problem Relation Age of Onset  . Heart attack Maternal Grandmother      Prior to Admission medications   Medication Sig Start Date End Date Taking? Authorizing Provider  Ascorbic Acid (VITAMIN C) 1000 MG tablet Take 1,000 mg by mouth daily.    Historical Provider, MD  Calcium Carbonate-Vitamin D (CALCIUM + D PO) Take 1,200 mg by mouth.    Historical Provider, MD  Digestive Enzymes (BETAINE HCL) 650-130 MG CAPS Take by mouth.    Historical Provider, MD  Loratadine (CLARITIN PO) Take by mouth as needed.    Historical Provider, MD  Multiple Vitamins-Minerals (MULTIVITAMIN PO) Take by mouth.    Historical Provider, MD  Omega-3 Fatty Acids (FISH OIL) 1200 MG CAPS Take by mouth.    Historical Provider, MD  Physical Exam: Filed Vitals:   05/07/15 1415 05/07/15 1430 05/07/15 1447 05/07/15 1448  BP: 170/71 181/90 174/82   Pulse: 90 87  91  Temp:      TempSrc:      Resp:      Height:      Weight:      SpO2: 96% 97%  97%    Wt Readings from Last 3 Encounters:  05/07/15 73.029 kg (161 lb)  07/20/14 75.297 kg (166 lb)  07/12/13 75.751 kg (167 lb)    General:  Elderly, Appears calm and comfortable Eyes:  PERRL, EOMI, normal lids, iris ENT:  grossly normal hearing, lips & tongue Neck:  no LAD, masses or thyromegaly Cardiovascular:  RRR, no m/r/g. No LE  edema.  Respiratory:  CTA bilaterally, no w/r/r. Normal respiratory effort. Abdomen:  soft, ntnd Skin:  no rash or induration seen on limited exam Musculoskeletal:  grossly normal tone BUE/BLE Psychiatric:  grossly normal mood and affect, speech fluent and appropriate Neurologic:  CN 2-12 grossly intact, moves all extremities in coordinated fashion.          Labs on Admission:  Basic Metabolic Panel:  Recent Labs Lab 05/07/15 0945 05/07/15 1004  NA 121* 124*  K 3.6 3.5  CL 88* 90*  CO2 19*  --   GLUCOSE 124* 125*  BUN 7 7  CREATININE 0.52 0.40*  CALCIUM 8.7*  --    Liver Function Tests:  Recent Labs Lab 05/07/15 0945  AST 21  ALT 15  ALKPHOS 71  BILITOT 0.9  PROT 6.9  ALBUMIN 3.4*    Recent Labs Lab 05/07/15 0945  LIPASE 22   No results for input(s): AMMONIA in the last 168 hours. CBC:  Recent Labs Lab 05/07/15 0945 05/07/15 1004  WBC 11.8*  --   HGB 12.9 14.6  HCT 37.7 43.0  MCV 80.2  --   PLT 379  --    Cardiac Enzymes: No results for input(s): CKTOTAL, CKMB, CKMBINDEX, TROPONINI in the last 168 hours.  BNP (last 3 results) No results for input(s): BNP in the last 8760 hours.  ProBNP (last 3 results) No results for input(s): PROBNP in the last 8760 hours.   CREATININE: 0.4 mg/dL ABNORMAL (05/07/15 1004) Estimated creatinine clearance - 54.5 mL/min  CBG: No results for input(s): GLUCAP in the last 168 hours.  Radiological Exams on Admission: Dg Chest 2 View  05/07/2015  CLINICAL DATA:  Mid back pain since Wednesday. New dizziness was shortness of breath and some nausea last night. EXAM: CHEST  2 VIEW COMPARISON:  Chest CT performed earlier same day. FINDINGS: Heart size is upper normal. Overall cardiomediastinal silhouette is within normal limits in size and configuration, with associated age-related ectasia of the thoracic aorta. Mild scarring versus atelectasis at each lung base, better seen on earlier chest CT. Lungs otherwise clear. No  confluent opacity to suggest a developing pneumonia. No pleural effusions seen. No pneumothorax seen. There is a compression fracture deformity of a mid thoracic vertebral body, labeled T7 on earlier CT, of uncertain age but most likely chronic. There is also a mild dextroscoliosis of the lower thoracic spine. Mild degenerative change noted within the thoracic spine. No acute-appearing osseous abnormality. IMPRESSION: Mild scarring versus atelectasis at each lung base, better seen on earlier chest CT. Lungs are otherwise clear and there is no evidence of acute cardiopulmonary abnormality. Compression fracture deformity of the T7 vertebral body, of uncertain age, but most likely old, also better seen on earlier  CT. Electronically Signed   By: Franki Cabot M.D.   On: 05/07/2015 12:24   Ct Angio Abdomen W/cm &/or Wo Contrast  05/07/2015  CLINICAL DATA:  82 year old female with mid back pain, hypertension and nausea/vomiting EXAM: CT ANGIOGRAPHY CHEST, ABDOMEN AND PELVIS TECHNIQUE: Multidetector CT imaging through the chest, abdomen and pelvis was performed using the standard protocol during bolus administration of intravenous contrast. Multiplanar reconstructed images and MIPs were obtained and reviewed to evaluate the vascular anatomy. CONTRAST:  28mL OMNIPAQUE IOHEXOL 350 MG/ML SOLN COMPARISON:  Prior MRI lumbar spine 04/18/2012 FINDINGS: CTA CHEST FINDINGS Mediastinum: Unremarkable CT appearance of the thyroid gland. No suspicious mediastinal or hilar adenopathy. No soft tissue mediastinal mass. The thoracic esophagus is unremarkable. Heart/Vascular: No evidence of acute intramural hematoma on the initial non contrasted images. Following the administration of intravenous contrast. There is a conventional 3 vessel arch morphology. No evidence of aneurysm or acute dissection. Mild atherosclerotic vascular calcifications without significant stenosis. Calcifications present along the course of the left anterior  descending, circumflex and right coronary arteries. Concentric hypertrophy of the left ventricle. The heart is at the upper limits of normal for size. No pericardial effusion. Normal caliber main and central pulmonary arteries. No evidence of acute pulmonary embolus. Lungs/Pleura: 5-6 mm nodule in the superior aspect of the right middle lobe (image 27 series 5). Adjacent 3 mm ground-glass attenuation nodule on the same image. 4 mm nodule slightly more inferior on image 31. Dependent atelectasis in both lower lobes. 3 mm ground-glass attenuation nodular opacity in the left lung apex on image 7 of series 5. Probable 5 mm pulmonary nodule in the left lower lobe (image 45 series 5). No evidence of focal airspace consolidation, pulmonary edema, pleural effusion or pneumothorax. Bones/Soft Tissues: No acute fracture or aggressive appearing lytic or blastic osseous lesion. T7 compression fracture with approximately 50% height loss anteriorly. Review of the MIP images confirms the above findings. CTA ABDOMEN AND PELVIS FINDINGS VASCULAR Aorta: Tortuous but normal caliber abdominal aorta. No evidence of acute dissection. Scattered calcified atherosclerotic plaque. No significant or irregular mural thrombus. Celiac: Variant anatomy. The splenic and left gastric artery arise from a common trunk. The proper hepatic artery arises independently from the aorta. Atherosclerotic plaque at the origin of both vessels results in at least mild narrowing. No visceral artery aneurysm. SMA: Widely patent and unremarkable. Renals: Single dominant renal arteries bilaterally which are widely patent without evidence of fibromuscular dysplasia. Accessory renal artery to the lower pole of the left kidney. IMA: Atherosclerotic plaque results in at least mild narrowing of the proximal vessel. The distal vessel remains patent. Inflow: Tortuous and mildly ectatic but no aneurysm, dissection or high-grade stenosis. Proximal Outflow: Visualized  portions are relatively disease free. Veins: No focal venous abnormality. NON-VASCULAR Abdomen: Moderate respiratory motion artifact in the upper abdomen. Unremarkable CT appearance of the stomach, duodenum, spleen, adrenal glands and pancreas. Normal hepatic contour and morphology. No discrete hepatic lesion. Gallbladder is unremarkable. No intra or extrahepatic biliary ductal dilatation. Unremarkable appearance of the bilateral kidneys. No focal solid lesion, hydronephrosis or nephrolithiasis. Colonic diverticular disease without CT evidence of active inflammation. The colon is decompressed. No focal bowel wall thickening or evidence of obstruction. No free fluid or suspicious adenopathy. Pelvis: The uterus is surgically absent. The bladder is dilated with urine. No free fluid or suspicious adenopathy. Bones/Soft Tissues: The bones appear diffusely demineralized. There is no evidence of acute fracture, malalignment or aggressive lytic or blastic osseous lesion. Advanced multilevel degenerative  disc disease. Lower lumbar facet arthropathy. L1 and L2 compression fractures without significant interval change compared to prior MRI. Review of the MIP images confirms the above findings. IMPRESSION: CTA CHEST 1. No acute dissection, aneurysm or other evidence of acute aortic syndrome. 2. Atherosclerotic vascular calcifications including 3 vessel coronary artery calcifications. 3. Concentric hypertrophy of the left ventricle. Does the patient have a clinical history of systemic arterial hypertension? 4. Multiple pulmonary nodules measuring up to 5 mm. While these are highly likely (in the absence of a known primary malignancy. If the patient has a known primary malignancy then metastatic disease would be more likely) the sequelae of a prior infectious/inflammatory or granulomatous process, stability cannot be confirmed without prior imaging for comparison. Consider follow-up CT scan of the chest in 6-12 months if the  documentation of stability is clinically warranted. 5. Age indeterminate (without prior imaging for comparison) but likely chronic T7 compression fracture. CTA ABD/PELVIS 1. Moderate respiratory/patient motion artifact. 2. No evidence of acute aortic dissection, aneurysm or other acute vascular abnormality. 3. Incidental note is made of variant anatomy of the celiac artery origin. 4. Atherosclerotic vascular calcifications without significant stenosis to suggest a source for chronic mesenteric ischemia. 5. Colonic diverticular disease without CT evidence of active inflammation. 6. Chronic L1 and L2 compression fractures without evidence of progressive height loss compared to 04/18/2012. Signed, Criselda Peaches, MD [facility] Radiology Electronically Signed   By: Jacqulynn Cadet M.D.   On: 05/07/2015 11:40   Ct Angio Chest Aorta W/cm &/or Wo/cm  05/07/2015  CLINICAL DATA:  82 year old female with mid back pain, hypertension and nausea/vomiting EXAM: CT ANGIOGRAPHY CHEST, ABDOMEN AND PELVIS TECHNIQUE: Multidetector CT imaging through the chest, abdomen and pelvis was performed using the standard protocol during bolus administration of intravenous contrast. Multiplanar reconstructed images and MIPs were obtained and reviewed to evaluate the vascular anatomy. CONTRAST:  81mL OMNIPAQUE IOHEXOL 350 MG/ML SOLN COMPARISON:  Prior MRI lumbar spine 04/18/2012 FINDINGS: CTA CHEST FINDINGS Mediastinum: Unremarkable CT appearance of the thyroid gland. No suspicious mediastinal or hilar adenopathy. No soft tissue mediastinal mass. The thoracic esophagus is unremarkable. Heart/Vascular: No evidence of acute intramural hematoma on the initial non contrasted images. Following the administration of intravenous contrast. There is a conventional 3 vessel arch morphology. No evidence of aneurysm or acute dissection. Mild atherosclerotic vascular calcifications without significant  stenosis. Calcifications present along the course of the left anterior descending, circumflex and right coronary arteries. Concentric hypertrophy of the left ventricle. The heart is at the upper limits of normal for size. No pericardial effusion. Normal caliber main and central pulmonary arteries. No evidence of acute pulmonary embolus. Lungs/Pleura: 5-6 mm nodule in the superior aspect of the right middle lobe (image 27 series 5). Adjacent 3 mm ground-glass attenuation nodule on the same image. 4 mm nodule slightly more inferior on image 31. Dependent atelectasis in both lower lobes. 3 mm ground-glass attenuation nodular opacity in the left lung apex on image 7 of series 5. Probable 5 mm pulmonary nodule in the left lower lobe (image 45 series 5). No evidence of focal airspace consolidation, pulmonary edema, pleural effusion or pneumothorax. Bones/Soft Tissues: No acute fracture or aggressive appearing lytic or blastic osseous lesion. T7 compression fracture with approximately 50% height loss anteriorly. Review of the MIP images confirms the above findings. CTA ABDOMEN AND PELVIS FINDINGS VASCULAR Aorta: Tortuous but normal caliber abdominal aorta. No evidence of acute dissection. Scattered calcified atherosclerotic plaque. No significant or  irregular mural thrombus. Celiac: Variant anatomy. The splenic and left gastric artery arise from a common trunk. The proper hepatic artery arises independently from the aorta. Atherosclerotic plaque at the origin of both vessels results in at least mild narrowing. No visceral artery aneurysm. SMA: Widely patent and unremarkable. Renals: Single dominant renal arteries bilaterally which are widely patent without evidence of fibromuscular dysplasia. Accessory renal artery to the lower pole of the left kidney. IMA: Atherosclerotic plaque results in at least mild narrowing of the proximal vessel. The distal vessel remains patent. Inflow: Tortuous and mildly ectatic but no aneurysm,  dissection or high-grade stenosis. Proximal Outflow: Visualized portions are relatively disease free. Veins: No focal venous abnormality. NON-VASCULAR Abdomen: Moderate respiratory motion artifact in the upper abdomen. Unremarkable CT appearance of the stomach, duodenum, spleen, adrenal glands and pancreas. Normal hepatic contour and morphology. No discrete hepatic lesion. Gallbladder is unremarkable. No intra or extrahepatic biliary ductal dilatation. Unremarkable appearance of the bilateral kidneys. No focal solid lesion, hydronephrosis or nephrolithiasis. Colonic diverticular disease without CT evidence of active inflammation. The colon is decompressed. No focal bowel wall thickening or evidence of obstruction. No free fluid or suspicious adenopathy. Pelvis: The uterus is surgically absent. The bladder is dilated with urine. No free fluid or suspicious adenopathy. Bones/Soft Tissues: The bones appear diffusely demineralized. There is no evidence of acute fracture, malalignment or aggressive lytic or blastic osseous lesion. Advanced multilevel degenerative disc disease. Lower lumbar facet arthropathy. L1 and L2 compression fractures without significant interval change compared to prior MRI. Review of the MIP images confirms the above findings. IMPRESSION: CTA CHEST 1. No acute dissection, aneurysm or other evidence of acute aortic syndrome. 2. Atherosclerotic vascular calcifications including 3 vessel coronary artery calcifications. 3. Concentric hypertrophy of the left ventricle. Does the patient have a clinical history of systemic arterial hypertension? 4. Multiple pulmonary nodules measuring up to 5 mm. While these are highly likely (in the absence of a known primary malignancy. If the patient has a known primary malignancy then metastatic disease would be more likely) the sequelae of a prior infectious/inflammatory or granulomatous process, stability cannot be confirmed without prior imaging for comparison.  Consider follow-up CT scan of the chest in 6-12 months if the documentation of stability is clinically warranted. 5. Age indeterminate (without prior imaging for comparison) but likely chronic T7 compression fracture. CTA ABD/PELVIS 1. Moderate respiratory/patient motion artifact. 2. No evidence of acute aortic dissection, aneurysm or other acute vascular abnormality. 3. Incidental note is made of variant anatomy of the celiac artery origin. 4. Atherosclerotic vascular calcifications without significant stenosis to suggest a source for chronic mesenteric ischemia. 5. Colonic diverticular disease without CT evidence of active inflammation. 6. Chronic L1 and L2 compression fractures without evidence of progressive height loss compared to 04/18/2012. Signed, Criselda Peaches, MD [facility] Radiology Electronically Signed   By: Jacqulynn Cadet M.D.   On: 05/07/2015 11:40    Assessment/Plan Active Problems:   Hyponatremia   Essential hypertension   Nausea & vomiting   Back pain  Hyponatremia:121 on admission. Likely chronic. Pt lives at home and cooks for self. States she drinks water and never puts salt on food. Unlikely from GI loss given how mild her condition is. Correction goal =<9 per 24hrs. - Tele - IVF NS  - Free fluid intake <1237ml - BMET 2200, am  Nausea, Vomiting, Diarrhea: likely mild viral gastroenteritis - zofran - IVF - probiotic  Back pain: likely MSK. No radiation. -  Pt/OT - NSAIDs - Robaxin PRN - UA pending  Hypertension: No previous h/o HTN. Suspect acute illness and stress.  - Hydralazine    Code Status: FULL  DVT Prophylaxis: Lovenox Family Communication: Daughter.  Disposition Plan: Pending Improvement    Latecia Miler Lenna Sciara, MD Family Medicine [facility] [email or web address] Password [email or web address]

## 2015-05-07 NOTE — Progress Notes (Signed)
Patient arrived in the unit at 15:00 accompanied by patient's niece and NT via stretcher. Patient's stable. No apparent distress noted. Orientation given to the unit and room. Patient verbalizes understanding.

## 2015-05-07 NOTE — Progress Notes (Signed)
PHARMACIST - PHYSICIAN ORDER COMMUNICATION  CONCERNING: P&T Medication Policy on Herbal Medications  DESCRIPTION:  This patient's order for:  Betaine  has been noted.  This product(s) is classified as an "herbal" or natural product. Due to a lack of definitive safety studies or FDA approval, nonstandard manufacturing practices, plus the potential risk of unknown drug-drug interactions while on inpatient medications, the Pharmacy and Therapeutics Committee does not permit the use of "herbal" or natural products of this type within [facility].   ACTION TAKEN: The pharmacy department is unable to verify this order at this time and your patient has been informed of this safety policy. Please reevaluate patient's clinical condition at discharge and address if the herbal or natural product(s) should be resumed at that time.  [place], Pharm.D., BCPS Clinical Pharmacist Pager: [phone] 05/07/2015 4:52 PM

## 2015-05-07 NOTE — ED Notes (Signed)
240 ml of water given to patient.

## 2015-05-08 ENCOUNTER — Observation Stay (HOSPITAL_COMMUNITY): Payer: Medicare Other

## 2015-05-08 DIAGNOSIS — M546 Pain in thoracic spine: Secondary | ICD-10-CM | POA: Diagnosis not present

## 2015-05-08 DIAGNOSIS — E871 Hypo-osmolality and hyponatremia: Secondary | ICD-10-CM | POA: Diagnosis not present

## 2015-05-08 DIAGNOSIS — S22060A Wedge compression fracture of T7-T8 vertebra, initial encounter for closed fracture: Secondary | ICD-10-CM | POA: Diagnosis not present

## 2015-05-08 DIAGNOSIS — I10 Essential (primary) hypertension: Secondary | ICD-10-CM | POA: Diagnosis not present

## 2015-05-08 DIAGNOSIS — R112 Nausea with vomiting, unspecified: Secondary | ICD-10-CM | POA: Diagnosis not present

## 2015-05-08 LAB — CBC
HCT: 35.3 % — ABNORMAL LOW (ref 36.0–46.0)
Hemoglobin: 12 g/dL (ref 12.0–15.0)
MCH: 27.5 pg (ref 26.0–34.0)
MCHC: 34 g/dL (ref 30.0–36.0)
MCV: 80.8 fL (ref 78.0–100.0)
PLATELETS: 384 10*3/uL (ref 150–400)
RBC: 4.37 MIL/uL (ref 3.87–5.11)
RDW: 13.2 % (ref 11.5–15.5)
WBC: 11.3 10*3/uL — AB (ref 4.0–10.5)

## 2015-05-08 LAB — OSMOLALITY: Osmolality: 251 mOsm/kg — ABNORMAL LOW (ref 275–295)

## 2015-05-08 LAB — OSMOLALITY, URINE: OSMOLALITY UR: 302 mosm/kg (ref 300–900)

## 2015-05-08 LAB — BASIC METABOLIC PANEL
Anion gap: 9 (ref 5–15)
CHLORIDE: 93 mmol/L — AB (ref 101–111)
CO2: 24 mmol/L (ref 22–32)
CREATININE: 0.58 mg/dL (ref 0.44–1.00)
Calcium: 8.3 mg/dL — ABNORMAL LOW (ref 8.9–10.3)
GFR calc Af Amer: >60 mL/min (ref >60)
GFR calc non Af Amer: >60 mL/min (ref >60)
Glucose, Bld: 118 mg/dL — ABNORMAL HIGH (ref 65–99)
Potassium: 3.2 mmol/L — ABNORMAL LOW (ref 3.5–5.1)
SODIUM: 126 mmol/L — AB (ref 135–145)

## 2015-05-08 LAB — SODIUM, URINE, RANDOM: SODIUM UR: 41 mmol/L

## 2015-05-08 MED ORDER — PANTOPRAZOLE SODIUM 40 MG PO TBEC
40.0000 mg | DELAYED_RELEASE_TABLET | Freq: Every day | ORAL | Status: DC
  Administered 2015-05-09 – 2015-05-12 (×4): 40 mg via ORAL
  Filled 2015-05-08 (×4): qty 1

## 2015-05-08 MED ORDER — POTASSIUM CHLORIDE CRYS ER 20 MEQ PO TBCR
40.0000 meq | EXTENDED_RELEASE_TABLET | Freq: Every day | ORAL | Status: DC
  Administered 2015-05-08 – 2015-05-12 (×5): 40 meq via ORAL
  Filled 2015-05-08 (×5): qty 2

## 2015-05-08 MED ORDER — HYDRALAZINE HCL 20 MG/ML IJ SOLN
10.0000 mg | Freq: Once | INTRAMUSCULAR | Status: AC
  Administered 2015-05-08: 10 mg via INTRAVENOUS

## 2015-05-08 MED ORDER — BESIFLOXACIN HCL 0.6 % OP SUSP
1.0000 [drp] | Freq: Three times a day (TID) | OPHTHALMIC | Status: DC
  Administered 2015-05-08 – 2015-05-09 (×5): 1 [drp] via OPHTHALMIC

## 2015-05-08 MED ORDER — DIFLUPREDNATE 0.05 % OP EMUL: 1.0000 [drp] | Freq: Every day | OPHTHALMIC | Status: DC

## 2015-05-08 MED ORDER — DIFLUPREDNATE 0.05 % OP EMUL
1.0000 [drp] | Freq: Three times a day (TID) | OPHTHALMIC | Status: DC
  Administered 2015-05-08 – 2015-05-09 (×4): 1 [drp] via OPHTHALMIC

## 2015-05-08 MED ORDER — NEPAFENAC 0.3 % OP SUSP: 1.0000 [drp] | Freq: Every day | OPHTHALMIC | Status: DC

## 2015-05-08 MED ORDER — IBUPROFEN 400 MG PO TABS
400.0000 mg | ORAL_TABLET | Freq: Once | ORAL | Status: AC
  Administered 2015-05-08: 400 mg via ORAL
  Filled 2015-05-08: qty 1

## 2015-05-08 MED ORDER — DIFLUPREDNATE 0.05 % OP EMUL: 1.0000 [drp] | Freq: Two times a day (BID) | OPHTHALMIC | Status: DC

## 2015-05-08 MED ORDER — HYDRALAZINE HCL 20 MG/ML IJ SOLN
20.0000 mg | Freq: Four times a day (QID) | INTRAMUSCULAR | Status: DC | PRN
  Administered 2015-05-09 – 2015-05-11 (×2): 20 mg via INTRAVENOUS
  Filled 2015-05-08 (×2): qty 1

## 2015-05-08 NOTE — Progress Notes (Signed)
[facility]                                                                              Patient Demographics  Cathy Silva, is a 82 y.o. female, DOB - [birth year], [id number]  Admit date - 05/07/2015   Admitting Physician Waldemar Dickens, MD  Outpatient Primary MD for the patient is [name], MD  LOS -    Chief Complaint  Patient presents with  . Back Pain       Brief HPI   HPI: Cathy Silva is a 82 y.o. female with no significant past medical history presented with back pain. Back pain. Acute onset. Started 5 days ago this patient was getting into her bed. Mid back without radiation. Denied any loss of function in her lower extremities or loss of bowel or bladder function. Fairly constant with waxing and waning nature. Worse with certain movements. Denied any fevers, dysuria or frequency. She also reported nausea with diarrhea 1 day prior to admission, vomiting 1 in ED. Patient was admitted for further workup.   Assessment & Plan    Active Problems: Hyponatremia:121 on admission.  -  Pt lives at home and cooks for self. States she drinks water and never puts salt on food. Unlikely from GI loss given how mild her condition is - Sodium improving, serum osmolarity 251, urine osmolarity and urine sodium pending - Continue IV fluid hydration  Nausea, Vomiting, Diarrhea: likely mild viral gastroenteritis - CT abdomen and pelvis negative for any acute abdominal pathology.  - Currently eating solids without any difficulty, continue gentle hydration.    Thoracic Back pain: likely MSK. No radiation. - UA negative for UTI - Follow thoracic spine x-ray, PTOT evaluation  Hypertension: No previous h/o HTN. Suspect acute illness and stress.  - Hydralazine  Pulmonary nodules incidentally seen on CT chest - Patient recommended repeat CT chest in 6-12 months for follow-up outpatient   Code Status: Full CODE STATUS  Family Communication:  Discussed in detail with the patient, all imaging results, lab results explained to the patient    Disposition Plan: Hopefully DC home tomorrow if medically ready  Time Spent in minutes   25 minutes  Procedures  CT angiogram chest, abdomen  Consults  None  DVT Prophylaxis  Lovenox   Medications  Scheduled Meds: . acidophilus  2 capsule Oral Daily  . Besifloxacin HCl  1 drop Ophthalmic TID WC  . Difluprednate  1 drop Right Eye BID   Followed by  . [START ON 05/09/2015] Difluprednate  1 drop Right Eye Daily  . Difluprednate  1 drop Left Eye TID   Followed by  . [START ON 05/13/2015] Difluprednate  1 drop Left Eye BID   Followed by  . [START ON 05/20/2015] Difluprednate  1 drop Left Eye BID  . enoxaparin (LOVENOX) injection  40 mg Subcutaneous Q24H  . nepafenac  1 drop Right Eye Daily  . nepafenac  1 drop Left Eye Daily  . potassium chloride  40 mEq Oral Daily  . sodium chloride  3 mL Intravenous Q12H   Continuous Infusions: . sodium chloride 75 mL/hr at 05/07/15 1821  PRN Meds:.acetaminophen **OR** acetaminophen, hydrALAZINE, methocarbamol, ondansetron **OR** ondansetron (ZOFRAN) IV   Antibiotics   Anti-infectives    None        Subjective:   Cathy Silva was seen and examined today.  Patient seen and examined, feels a lot better today, denying any back pain, nausea, vomiting or any abdominal pain. No diarrhea. No fevers or chills. Patient denies dizziness, chest pain, shortness of breath, new weakness, numbess, tingling. No acute events overnight.    Objective:   Blood pressure 152/58, pulse 90, temperature 98.2 F (36.8 C), temperature source Oral, resp. rate 20, height 5\' 6"  (1.676 m), weight 72.394 kg (159 lb 9.6 oz), last menstrual period 05/31/1987, SpO2 97 %.  Wt Readings from Last 3 Encounters:  05/08/15 72.394 kg (159 lb 9.6 oz)  07/20/14 75.297 kg (166 lb)  07/12/13 75.751 kg (167 lb)     Intake/Output Summary (Last 24 hours) at 05/08/15  1220 Last data filed at 05/08/15 0933  Gross per 24 hour  Intake    723 ml  Output   1675 ml  Net   -952 ml    Exam  General: Alert and oriented x 3, NAD  HEENT:  PERRLA, EOMI, Anicteric Sclera, mucous membranes moist.   Neck: Supple, no JVD, no masses  CVS: S1 S2 auscultated, no rubs, murmurs or gallops. Regular rate and rhythm.  Respiratory: Clear to auscultation bilaterally, no wheezing, rales or rhonchi  Abdomen: Soft, nontender, nondistended, + bowel sounds  Ext: no cyanosis clubbing or edema  Neuro: AAOx3, Cr N's II- XII. Strength 5/5 upper and lower extremities bilaterally  Skin: No rashes  Psych: Normal affect and demeanor, alert and oriented x3    Data Review   Micro Results No results found for this or any previous visit (from the past 240 hour(s)).  Radiology Reports Dg Chest 2 View  05/07/2015  CLINICAL DATA:  Mid back pain since Wednesday. New dizziness was shortness of breath and some nausea last night. EXAM: CHEST  2 VIEW COMPARISON:  Chest CT performed earlier same day. FINDINGS: Heart size is upper normal. Overall cardiomediastinal silhouette is within normal limits in size and configuration, with associated age-related ectasia of the thoracic aorta. Mild scarring versus atelectasis at each lung base, better seen on earlier chest CT. Lungs otherwise clear. No confluent opacity to suggest a developing pneumonia. No pleural effusions seen. No pneumothorax seen. There is a compression fracture deformity of a mid thoracic vertebral body, labeled T7 on earlier CT, of uncertain age but most likely chronic. There is also a mild dextroscoliosis of the lower thoracic spine. Mild degenerative change noted within the thoracic spine. No acute-appearing osseous abnormality. IMPRESSION: Mild scarring versus atelectasis at each lung base, better seen on earlier chest CT. Lungs are otherwise clear and there is no evidence of acute cardiopulmonary abnormality. Compression  fracture deformity of the T7 vertebral body, of uncertain age, but most likely old, also better seen on earlier CT. Electronically Signed   By: Franki Cabot M.D.   On: 05/07/2015 12:24   Ct Angio Abdomen W/cm &/or Wo Contrast  05/07/2015  CLINICAL DATA:  82 year old female with mid back pain, hypertension and nausea/vomiting EXAM: CT ANGIOGRAPHY CHEST, ABDOMEN AND PELVIS TECHNIQUE: Multidetector CT imaging through the chest, abdomen and pelvis was performed using the standard protocol during bolus administration of intravenous contrast. Multiplanar reconstructed images and MIPs were obtained and reviewed to evaluate the vascular anatomy. CONTRAST:  71mL OMNIPAQUE IOHEXOL 350 MG/ML SOLN COMPARISON:  Prior  MRI lumbar spine 04/18/2012 FINDINGS: CTA CHEST FINDINGS Mediastinum: Unremarkable CT appearance of the thyroid gland. No suspicious mediastinal or hilar adenopathy. No soft tissue mediastinal mass. The thoracic esophagus is unremarkable. Heart/Vascular: No evidence of acute intramural hematoma on the initial non contrasted images. Following the administration of intravenous contrast. There is a conventional 3 vessel arch morphology. No evidence of aneurysm or acute dissection. Mild atherosclerotic vascular calcifications without significant stenosis. Calcifications present along the course of the left anterior descending, circumflex and right coronary arteries. Concentric hypertrophy of the left ventricle. The heart is at the upper limits of normal for size. No pericardial effusion. Normal caliber main and central pulmonary arteries. No evidence of acute pulmonary embolus. Lungs/Pleura: 5-6 mm nodule in the superior aspect of the right middle lobe (image 27 series 5). Adjacent 3 mm ground-glass attenuation nodule on the same image. 4 mm nodule slightly more inferior on image 31. Dependent atelectasis in both lower lobes. 3 mm ground-glass attenuation nodular opacity in the left lung apex on image 7 of series 5.  Probable 5 mm pulmonary nodule in the left lower lobe (image 45 series 5). No evidence of focal airspace consolidation, pulmonary edema, pleural effusion or pneumothorax. Bones/Soft Tissues: No acute fracture or aggressive appearing lytic or blastic osseous lesion. T7 compression fracture with approximately 50% height loss anteriorly. Review of the MIP images confirms the above findings. CTA ABDOMEN AND PELVIS FINDINGS VASCULAR Aorta: Tortuous but normal caliber abdominal aorta. No evidence of acute dissection. Scattered calcified atherosclerotic plaque. No significant or irregular mural thrombus. Celiac: Variant anatomy. The splenic and left gastric artery arise from a common trunk. The proper hepatic artery arises independently from the aorta. Atherosclerotic plaque at the origin of both vessels results in at least mild narrowing. No visceral artery aneurysm. SMA: Widely patent and unremarkable. Renals: Single dominant renal arteries bilaterally which are widely patent without evidence of fibromuscular dysplasia. Accessory renal artery to the lower pole of the left kidney. IMA: Atherosclerotic plaque results in at least mild narrowing of the proximal vessel. The distal vessel remains patent. Inflow: Tortuous and mildly ectatic but no aneurysm, dissection or high-grade stenosis. Proximal Outflow: Visualized portions are relatively disease free. Veins: No focal venous abnormality. NON-VASCULAR Abdomen: Moderate respiratory motion artifact in the upper abdomen. Unremarkable CT appearance of the stomach, duodenum, spleen, adrenal glands and pancreas. Normal hepatic contour and morphology. No discrete hepatic lesion. Gallbladder is unremarkable. No intra or extrahepatic biliary ductal dilatation. Unremarkable appearance of the bilateral kidneys. No focal solid lesion, hydronephrosis or nephrolithiasis. Colonic diverticular disease without CT evidence of active inflammation. The colon is decompressed. No focal bowel  wall thickening or evidence of obstruction. No free fluid or suspicious adenopathy. Pelvis: The uterus is surgically absent. The bladder is dilated with urine. No free fluid or suspicious adenopathy. Bones/Soft Tissues: The bones appear diffusely demineralized. There is no evidence of acute fracture, malalignment or aggressive lytic or blastic osseous lesion. Advanced multilevel degenerative disc disease. Lower lumbar facet arthropathy. L1 and L2 compression fractures without significant interval change compared to prior MRI. Review of the MIP images confirms the above findings. IMPRESSION: CTA CHEST 1. No acute dissection, aneurysm or other evidence of acute aortic syndrome. 2. Atherosclerotic vascular calcifications including 3 vessel coronary artery calcifications. 3. Concentric hypertrophy of the left ventricle. Does the patient have a clinical history of systemic arterial hypertension? 4. Multiple pulmonary nodules measuring up to 5 mm. While these are highly likely (in the absence of a known primary malignancy.  If the patient has a known primary malignancy then metastatic disease would be more likely) the sequelae of a prior infectious/inflammatory or granulomatous process, stability cannot be confirmed without prior imaging for comparison. Consider follow-up CT scan of the chest in 6-12 months if the documentation of stability is clinically warranted. 5. Age indeterminate (without prior imaging for comparison) but likely chronic T7 compression fracture. CTA ABD/PELVIS 1. Moderate respiratory/patient motion artifact. 2. No evidence of acute aortic dissection, aneurysm or other acute vascular abnormality. 3. Incidental note is made of variant anatomy of the celiac artery origin. 4. Atherosclerotic vascular calcifications without significant stenosis to suggest a source for chronic mesenteric ischemia. 5. Colonic diverticular disease without CT evidence of active inflammation. 6. Chronic L1 and L2 compression  fractures without evidence of progressive height loss compared to 04/18/2012. Signed, Criselda Peaches, MD [facility] Radiology Electronically Signed   By: Jacqulynn Cadet M.D.   On: 05/07/2015 11:40   Ct Angio Chest Aorta W/cm &/or Wo/cm  05/07/2015  CLINICAL DATA:  82 year old female with mid back pain, hypertension and nausea/vomiting EXAM: CT ANGIOGRAPHY CHEST, ABDOMEN AND PELVIS TECHNIQUE: Multidetector CT imaging through the chest, abdomen and pelvis was performed using the standard protocol during bolus administration of intravenous contrast. Multiplanar reconstructed images and MIPs were obtained and reviewed to evaluate the vascular anatomy. CONTRAST:  40mL OMNIPAQUE IOHEXOL 350 MG/ML SOLN COMPARISON:  Prior MRI lumbar spine 04/18/2012 FINDINGS: CTA CHEST FINDINGS Mediastinum: Unremarkable CT appearance of the thyroid gland. No suspicious mediastinal or hilar adenopathy. No soft tissue mediastinal mass. The thoracic esophagus is unremarkable. Heart/Vascular: No evidence of acute intramural hematoma on the initial non contrasted images. Following the administration of intravenous contrast. There is a conventional 3 vessel arch morphology. No evidence of aneurysm or acute dissection. Mild atherosclerotic vascular calcifications without significant stenosis. Calcifications present along the course of the left anterior descending, circumflex and right coronary arteries. Concentric hypertrophy of the left ventricle. The heart is at the upper limits of normal for size. No pericardial effusion. Normal caliber main and central pulmonary arteries. No evidence of acute pulmonary embolus. Lungs/Pleura: 5-6 mm nodule in the superior aspect of the right middle lobe (image 27 series 5). Adjacent 3 mm ground-glass attenuation nodule on the same image. 4 mm nodule slightly more inferior on image 31. Dependent atelectasis in both lower lobes. 3 mm ground-glass  attenuation nodular opacity in the left lung apex on image 7 of series 5. Probable 5 mm pulmonary nodule in the left lower lobe (image 45 series 5). No evidence of focal airspace consolidation, pulmonary edema, pleural effusion or pneumothorax. Bones/Soft Tissues: No acute fracture or aggressive appearing lytic or blastic osseous lesion. T7 compression fracture with approximately 50% height loss anteriorly. Review of the MIP images confirms the above findings. CTA ABDOMEN AND PELVIS FINDINGS VASCULAR Aorta: Tortuous but normal caliber abdominal aorta. No evidence of acute dissection. Scattered calcified atherosclerotic plaque. No significant or irregular mural thrombus. Celiac: Variant anatomy. The splenic and left gastric artery arise from a common trunk. The proper hepatic artery arises independently from the aorta. Atherosclerotic plaque at the origin of both vessels results in at least mild narrowing. No visceral artery aneurysm. SMA: Widely patent and unremarkable. Renals: Single dominant renal arteries bilaterally which are widely patent without evidence of fibromuscular dysplasia. Accessory renal artery to the lower pole of the left kidney. IMA: Atherosclerotic plaque results in at least mild narrowing of the proximal vessel. The distal vessel remains patent.  Inflow: Tortuous and mildly ectatic but no aneurysm, dissection or high-grade stenosis. Proximal Outflow: Visualized portions are relatively disease free. Veins: No focal venous abnormality. NON-VASCULAR Abdomen: Moderate respiratory motion artifact in the upper abdomen. Unremarkable CT appearance of the stomach, duodenum, spleen, adrenal glands and pancreas. Normal hepatic contour and morphology. No discrete hepatic lesion. Gallbladder is unremarkable. No intra or extrahepatic biliary ductal dilatation. Unremarkable appearance of the bilateral kidneys. No focal solid lesion, hydronephrosis or nephrolithiasis. Colonic diverticular disease without CT  evidence of active inflammation. The colon is decompressed. No focal bowel wall thickening or evidence of obstruction. No free fluid or suspicious adenopathy. Pelvis: The uterus is surgically absent. The bladder is dilated with urine. No free fluid or suspicious adenopathy. Bones/Soft Tissues: The bones appear diffusely demineralized. There is no evidence of acute fracture, malalignment or aggressive lytic or blastic osseous lesion. Advanced multilevel degenerative disc disease. Lower lumbar facet arthropathy. L1 and L2 compression fractures without significant interval change compared to prior MRI. Review of the MIP images confirms the above findings. IMPRESSION: CTA CHEST 1. No acute dissection, aneurysm or other evidence of acute aortic syndrome. 2. Atherosclerotic vascular calcifications including 3 vessel coronary artery calcifications. 3. Concentric hypertrophy of the left ventricle. Does the patient have a clinical history of systemic arterial hypertension? 4. Multiple pulmonary nodules measuring up to 5 mm. While these are highly likely (in the absence of a known primary malignancy. If the patient has a known primary malignancy then metastatic disease would be more likely) the sequelae of a prior infectious/inflammatory or granulomatous process, stability cannot be confirmed without prior imaging for comparison. Consider follow-up CT scan of the chest in 6-12 months if the documentation of stability is clinically warranted. 5. Age indeterminate (without prior imaging for comparison) but likely chronic T7 compression fracture. CTA ABD/PELVIS 1. Moderate respiratory/patient motion artifact. 2. No evidence of acute aortic dissection, aneurysm or other acute vascular abnormality. 3. Incidental note is made of variant anatomy of the celiac artery origin. 4. Atherosclerotic vascular calcifications without significant stenosis to suggest a source for chronic mesenteric ischemia. 5. Colonic diverticular disease  without CT evidence of active inflammation. 6. Chronic L1 and L2 compression fractures without evidence of progressive height loss compared to 04/18/2012. Signed, Criselda Peaches, MD [facility] Radiology Electronically Signed   By: Jacqulynn Cadet M.D.   On: 05/07/2015 11:40    CBC  Recent Labs Lab 05/07/15 0945 05/07/15 1004 05/08/15 0534  WBC 11.8*  --  11.3*  HGB 12.9 14.6 12.0  HCT 37.7 43.0 35.3*  PLT 379  --  384  MCV 80.2  --  80.8  MCH 27.4  --  27.5  MCHC 34.2  --  34.0  RDW 13.1  --  13.2    Chemistries   Recent Labs Lab 05/07/15 0945 05/07/15 1004 05/07/15 2206 05/08/15 0534  NA 121* 124* 124* 126*  K 3.6 3.5 3.6 3.2*  CL 88* 90* 93* 93*  CO2 19*  --  20* 24  GLUCOSE 124* 125* 117* 118*  BUN 7 7 7  <5*  CREATININE 0.52 0.40* 0.61 0.58  CALCIUM 8.7*  --  8.4* 8.3*  AST 21  --   --   --   ALT 15  --   --   --   ALKPHOS 71  --   --   --   BILITOT 0.9  --   --   --    ------------------------------------------------------------------------------------------------------------------ estimated creatinine clearance is  54.3 mL/min (by C-G formula based on Cr of 0.58). ------------------------------------------------------------------------------------------------------------------ No results for input(s): HGBA1C in the last 72 hours. ------------------------------------------------------------------------------------------------------------------ No results for input(s): CHOL, HDL, LDLCALC, TRIG, CHOLHDL, LDLDIRECT in the last 72 hours. ------------------------------------------------------------------------------------------------------------------ No results for input(s): TSH, T4TOTAL, T3FREE, THYROIDAB in the last 72 hours.  Invalid input(s): FREET3 ------------------------------------------------------------------------------------------------------------------ No results for input(s): VITAMINB12, FOLATE,  FERRITIN, TIBC, IRON, RETICCTPCT in the last 72 hours.  Coagulation profile No results for input(s): INR, PROTIME in the last 168 hours.  No results for input(s): DDIMER in the last 72 hours.  Cardiac Enzymes No results for input(s): CKMB, TROPONINI, MYOGLOBIN in the last 168 hours.  Invalid input(s): CK ------------------------------------------------------------------------------------------------------------------ Invalid input(s): POCBNP  No results for input(s): GLUCAP in the last 72 hours.   Cathy Silva M.D. [facility] 05/08/2015, 12:20 PM  Pager: [phone] Between 7am to 7pm - call Pager - [phone]  After 7pm go to [email or web address] - password [email or web address]  Call night coverage person covering after 7pm

## 2015-05-08 NOTE — Progress Notes (Signed)
Nutrition Brief Note  Patient identified on the Malnutrition Screening Tool (MST) Report  Wt Readings from Last 15 Encounters:  05/08/15 159 lb 9.6 oz (72.394 kg)  07/20/14 166 lb (75.297 kg)  07/12/13 167 lb (75.751 kg)    Body mass index is 25.77 kg/(m^2). Patient meets criteria for Overweight based on current BMI. Pt reports weighing 161 lbs usually. She relates weight loss to being less active and eating a little less than usual. She reports eating 3 meals daily PTA with a variety of foods. She denies any questions or concerns at this time.   Current diet order is Heart Healthy, patient is consuming approximately 75% of meals at this time. Labs and medications reviewed.   No nutrition interventions warranted at this time. If nutrition issues arise, please consult RD.   Scarlette Ar RD, LDN Inpatient Clinical Dietitian Pager: [phone] After Hours Pager: [phone]

## 2015-05-08 NOTE — Evaluation (Signed)
Occupational Therapy Evaluation Patient Details Name: Cathy Silva MRN: [id number] DOB: 25-Oct-1929 Today's Date: 05/08/2015    History of Present Illness Pt admit with back pain. Acute onset. Started 5 days ago this patient was getting into her bed. Mid back without radiation. Denies any loss of function in her lower extremities or loss of bowel or bladder function. Fairly constant with waxing and waning nature. Worse with certain movements. Denies any dysuria or frequency. Denies fevers. Associate with nausea. Diarrhea 1 one day ago. Vomiting 1 in ED that was nonbilious non-bloody. 200 mg of ibuprofen daily without improvement. 400 mg of ibuprofen 1 on day of admission with some improvement.   Clinical Impression   This 82 yo female admitted with above presents to acute OT with deficits below. She will benefit from acute OT with follow up [place] to get back to her PLOF of independent/Mod I.    Follow Up Recommendations  Home health OT    Equipment Recommendations  3 in 1 bedside comode       Precautions / Restrictions Precautions Precautions: Fall Restrictions Weight Bearing Restrictions: No      Mobility Bed Mobility Overal bed mobility: Modified Independent             General bed mobility comments: walked pt thorough how she should get out of bed due to her back pain  Transfers Overall transfer level: Needs assistance Equipment used: Rolling walker (2 wheeled) Transfers: Sit to/from Stand Sit to Stand: Min guard         General transfer comment: Pt ambulated 150 feet with RW with min guard A    Balance Overall balance assessment: Needs assistance;History of Falls Sitting-balance support: No upper extremity supported;Feet supported Sitting balance-Leahy Scale: Good     Standing balance support: Bilateral upper extremity supported;During functional activity Standing balance-Leahy Scale: Fair Standing balance comment: reliant on RW                             ADL Overall ADL's : Needs assistance/impaired Eating/Feeding: Independent;Sitting   Grooming: Min guard;Standing   Upper Body Bathing: Set up;Supervision/ safety;Sitting   Lower Body Bathing: Min guard;Sit to/from stand   Upper Body Dressing : Set up;Min guard;Sitting   Lower Body Dressing: Min guard;Sit to/from stand   Toilet Transfer: Min guard;Ambulation;Comfort height toilet;Grab bars;RW   Toileting- Water quality scientist and Hygiene: Min guard;Sit to/from stand         General ADL Comments: Made pt aware she may want to consider a tub seat for her shower in the short run due to weakness and increased back pain--made her aware that insurance does not pay for them and she would have to get one own her own.               Pertinent Vitals/Pain Pain Assessment: 0-10 Pain Score: 2  Pain Location: back Pain Descriptors / Indicators: Aching Pain Intervention(s): Monitored during session;Repositioned     Hand Dominance Right   Extremity/Trunk Assessment Upper Extremity Assessment Upper Extremity Assessment: Overall WFL for tasks assessed   Lower Extremity Assessment Lower Extremity Assessment: Generalized weakness   Cervical / Trunk Assessment Cervical / Trunk Assessment: Normal   Communication Communication Communication: No difficulties   Cognition Arousal/Alertness: Awake/alert Behavior During Therapy: WFL for tasks assessed/performed Overall Cognitive Status: Within Functional Limits for tasks assessed  Home Living Family/patient expects to be discharged to:: Private residence Living Arrangements: Alone Available Help at Discharge: Family;Available PRN/intermittently Type of Home: House Home Access: Stairs to enter Entrance Stairs-Number of Steps: 1+1   Home Layout: One level     Bathroom Shower/Tub: Walk-in shower;Door   [employer] Toilet: Standard Bathroom Accessibility: Yes   Home  Equipment: None          Prior Functioning/Environment Level of Independence: Independent        Comments: pt drives and does grocery shopping and cleaning    OT Diagnosis: Generalized weakness;Acute pain   OT Problem List: Pain;Impaired balance (sitting and/or standing);Decreased strength;Decreased knowledge of use of DME or AE   OT Treatment/Interventions: Self-care/ADL training;Patient/family education;Balance training;DME and/or AE instruction    OT Goals(Current goals can be found in the care plan section) Acute Rehab OT Goals Patient Stated Goal: to go home OT Goal Formulation: With patient Time For Goal Achievement: 05/15/15 Potential to Achieve Goals: Good  OT Frequency: Min 2X/week   Barriers to D/C: Decreased caregiver support             End of Session Equipment Utilized During Treatment: Rolling walker  Activity Tolerance: Patient tolerated treatment well Patient left: in chair;with call bell/phone within reach;with chair alarm set;with family/visitor present   Time: [id number] OT Time Calculation (min): 27 min Charges:  OT General Charges $OT Visit: 1 Procedure OT Evaluation $OT Eval Moderate Complexity: 1 Procedure OT Treatments $Self Care/Home Management : 8-22 mins  Almon Register [id number] 05/08/2015, 4:16 PM

## 2015-05-08 NOTE — Evaluation (Signed)
Clinical/Bedside Swallow Evaluation Patient Details  Name: Cathy Silva MRN: [id number] Date of Birth: Aug 10, 1929  Today's Date: 05/08/2015 Time: SLP Start Time (ACUTE ONLY): 1526 SLP Stop Time (ACUTE ONLY): 1541 SLP Time Calculation (min) (ACUTE ONLY): 15 min  Past Medical History:  Past Medical History  Diagnosis Date  . Melanoma in situ ([place]) 2009  . BCC (basal cell carcinoma), arm 2008    Left    Past Surgical History:  Past Surgical History  Procedure Laterality Date  . Total abdominal hysterectomy w/ bilateral salpingoophorectomy  05/1987    Leiomyoma  . Dilation and curettage of uterus  05/1984    Prolif  . Dilation and curettage of uterus  06/1986    benign  . Eye surgery      bilat cataract  . Tonsillectomy     HPI:  Pt admit with back pain. Acute onset. Started 5 days ago this patient was getting into her bed. Mid back without radiation. Denies any loss of function in her lower extremities or loss of bowel or bladder function. Fairly constant with waxing and waning nature. Worse with certain movements. Denies any dysuria or frequency. Denies fevers. Associate with nausea. Diarrhea 1 one day ago. Vomiting 1 in ED that was nonbilious non-bloody. 200 mg of ibuprofen daily without improvement. 400 mg of ibuprofen 1 on day of admission with some improvement.Pt has been reporting difficulty swallowing with reduced PO intake. She says that in the past she has been told that she has a hiatal hernia.   Assessment / Plan / Recommendation Clinical Impression  Pt has symptoms that appear most consistent with a primary esophageal source, including frequent eructation with liquid intake, globus sensation, and reported feeling of food getting "stuck" in her chest. She does have delayed coughing throughout intake, although vocal quality remains clear. Suspect that this is related to possible esophageal issues, although also cannot rule out backflow into the pharynx/larynx  post-swallow. MD may wish to further assess esophageal function. For now SLP provided esophageal precautions and will soften diet to Dys 3 textures and thin liquids. Will f/u to determine if MBS is also necessary, as reported swallowing difficulties appear to be hindering adequate PO intake.    Aspiration Risk  Mild aspiration risk    Diet Recommendation  Dys 3 diet, thin liquids   Medication Administration: Whole meds with puree    Other  Recommendations Oral Care Recommendations: Oral care BID   Follow up Recommendations   (tba)    Frequency and Duration min 2x/week  2 weeks       Prognosis Prognosis for Safe Diet Advancement: Good      Swallow Study   General HPI: Pt admit with back pain. Acute onset. Started 5 days ago this patient was getting into her bed. Mid back without radiation. Denies any loss of function in her lower extremities or loss of bowel or bladder function. Fairly constant with waxing and waning nature. Worse with certain movements. Denies any dysuria or frequency. Denies fevers. Associate with nausea. Diarrhea 1 one day ago. Vomiting 1 in ED that was nonbilious non-bloody. 200 mg of ibuprofen daily without improvement. 400 mg of ibuprofen 1 on day of admission with some improvement.Pt has been reporting difficulty swallowing with reduced PO intake. She says that in the past she has been told that she has a hiatal hernia. Type of Study: Bedside Swallow Evaluation Previous Swallow Assessment: none in chart Diet Prior to this Study: Regular;Thin liquids Temperature Spikes Noted: No  Respiratory Status: Room air History of Recent Intubation: No Behavior/Cognition: Alert;Cooperative;Pleasant mood Oral Cavity Assessment: Within Functional Limits Oral Care Completed by SLP: No Oral Cavity - Dentition: Adequate natural dentition Vision: Functional for self-feeding Self-Feeding Abilities: Able to feed self Patient Positioning: Upright in chair Baseline Vocal  Quality: Normal Volitional Cough: Strong    Oral/Motor/Sensory Function Overall Oral Motor/Sensory Function: Within functional limits   Ice Chips Ice chips: Not tested   Thin Liquid Thin Liquid: Impaired Presentation: Cup;Self Fed;Straw Pharyngeal  Phase Impairments: Cough - Delayed    Nectar Thick Nectar Thick Liquid: Not tested   Honey Thick Honey Thick Liquid: Not tested   Puree Puree: Impaired Presentation: Self Fed;Spoon Pharyngeal Phase Impairments: Cough - Delayed   Solid   GO Functional Assessment Tool Used: skilled clinical judgment Functional Limitations: Swallowing Swallow Current Status [id number]): At least 20 percent but less than 40 percent impaired, limited or restricted Swallow Goal Status [phone]): At least 1 percent but less than 20 percent impaired, limited or restricted  Solid: Impaired Presentation: Self Fed;Spoon Pharyngeal Phase Impairments: Cough - Delayed        Germain Osgood, M.A. CCC-SLP [phone]  Germain Osgood 05/08/2015,4:13 PM

## 2015-05-08 NOTE — Evaluation (Signed)
Physical Therapy Evaluation Patient Details Name: Cathy Silva MRN: [id number] DOB: 02/22/1930 Today's Date: 05/08/2015   History of Present Illness  Pt admit with back pain. Acute onset. Started 5 days ago this patient was getting into her bed. Mid back without radiation. Denies any loss of function in her lower extremities or loss of bowel or bladder function. Fairly constant with waxing and waning nature. Worse with certain movements. Denies any dysuria or frequency. Denies fevers. Associate with nausea. Diarrhea 1 one day ago. Vomiting 1 in ED that was nonbilious non-bloody. 200 mg of ibuprofen daily without improvement. 400 mg of ibuprofen 1 on day of admission with some improvement.  Clinical Impression  Pt admitted with above diagnosis. Pt currently with functional limitations due to the deficits listed below (see PT Problem List). Pt needing steadying assist for mobility ambulating well with RW.  Will need RW and HhPT at home.  Will follow acutely.   Pt will benefit from skilled PT to increase their independence and safety with mobility to allow discharge to the venue listed below.    Follow Up Recommendations Home health PT;Supervision/Assistance - 24 hour    Equipment Recommendations  Rolling walker with 5" wheels    Recommendations for Other Services       Precautions / Restrictions Precautions Precautions: Fall Restrictions Weight Bearing Restrictions: No      Mobility  Bed Mobility Overal bed mobility: Independent                Transfers Overall transfer level: Needs assistance Equipment used: Rolling walker (2 wheeled) Transfers: Sit to/from Stand Sit to Stand: Min guard         General transfer comment: used hands for transfer.  Ambulation/Gait Ambulation/Gait assistance: Min guard Ambulation Distance (Feet): 40 Feet Assistive device: Rolling walker (2 wheeled) Gait Pattern/deviations: Step-through pattern;Decreased stride length;Trunk  flexed     General Gait Details: Pt ambulated with good safety with rW.  Occasional cues for upright posture and to stay close to rW.  Stairs            Wheelchair Mobility    Modified Rankin (Stroke Patients Only)       Balance Overall balance assessment: Needs assistance;History of Falls Sitting-balance support: No upper extremity supported;Feet supported Sitting balance-Leahy Scale: Good     Standing balance support: During functional activity;Bilateral upper extremity supported Standing balance-Leahy Scale: Poor Standing balance comment: requiring UE support at present with RW                             Pertinent Vitals/Pain Pain Assessment: No/denies pain  VSS    Home Living Family/patient expects to be discharged to:: Private residence Living Arrangements: Alone Available Help at Discharge: Family;Available PRN/intermittently Type of Home: House Home Access: Stairs to enter   Entrance Stairs-Number of Steps: 1+1 Home Layout: One level Home Equipment: None      Prior Function Level of Independence: Independent         Comments: pt drives and does grocery shopping and cleaning     Hand Dominance   Dominant Hand: Right    Extremity/Trunk Assessment   Upper Extremity Assessment: Defer to OT evaluation           Lower Extremity Assessment: Generalized weakness      Cervical / Trunk Assessment: Normal  Communication   Communication: No difficulties  Cognition Arousal/Alertness: Awake/alert Behavior During Therapy: WFL for tasks assessed/performed Overall Cognitive  Status: Within Functional Limits for tasks assessed                      General Comments      Exercises        Assessment/Plan    PT Assessment Patient needs continued PT services  PT Diagnosis Generalized weakness   PT Problem List Decreased activity tolerance;Decreased balance;Decreased mobility;Decreased knowledge of use of DME;Decreased  safety awareness;Decreased knowledge of precautions  PT Treatment Interventions DME instruction;Gait training;Functional mobility training;Therapeutic activities;Therapeutic exercise;Balance training;Patient/family education;Stair training   PT Goals (Current goals can be found in the Care Plan section) Acute Rehab PT Goals Patient Stated Goal: to go home PT Goal Formulation: With patient Time For Goal Achievement: 05/22/15 Potential to Achieve Goals: Good    Frequency Min 3X/week   Barriers to discharge Decreased caregiver support      Co-evaluation               End of Session Equipment Utilized During Treatment: Gait belt Activity Tolerance: Patient limited by fatigue Patient left: in chair;with call bell/phone within reach;with chair alarm set Nurse Communication: Mobility status    Functional Assessment Tool Used: clinical judgment Functional Limitation: Mobility: Walking and moving around Mobility: Walking and Moving Around Current Status [phone]): At least 1 percent but less than 20 percent impaired, limited or restricted Mobility: Walking and Moving Around Goal Status [phone]): At least 1 percent but less than 20 percent impaired, limited or restricted    Time: 1133-1147 PT Time Calculation (min) (ACUTE ONLY): 14 min   Charges:   PT Evaluation $PT Eval Low Complexity: 1 Procedure     PT G Codes:   PT G-Codes **NOT FOR INPATIENT CLASS** Functional Assessment Tool Used: clinical judgment Functional Limitation: Mobility: Walking and moving around Mobility: Walking and Moving Around Current Status [id number]): At least 1 percent but less than 20 percent impaired, limited or restricted Mobility: Walking and Moving Around Goal Status [phone]): At least 1 percent but less than 20 percent impaired, limited or restricted    Denice Paradise 05/08/2015, 3:31 PM [facility] Acute Rehabilitation [phone] (pager)

## 2015-05-08 NOTE — Progress Notes (Signed)
Pt c/o not being able to swallow well.  MD text paged, will continue to monitor.

## 2015-05-08 NOTE — Care Management Obs Status (Signed)
[facility] NOTIFICATION   Patient Details  Name: Cathy Silva MRN: [id number] Date of Birth: Jul 15, 1929   Medicare Observation Status Notification Given:  Yes    Vergie Living, RN 05/08/2015, 2:37 PM

## 2015-05-09 ENCOUNTER — Observation Stay (HOSPITAL_COMMUNITY): Payer: Medicare Other

## 2015-05-09 DIAGNOSIS — M546 Pain in thoracic spine: Secondary | ICD-10-CM | POA: Diagnosis not present

## 2015-05-09 DIAGNOSIS — R112 Nausea with vomiting, unspecified: Secondary | ICD-10-CM | POA: Diagnosis not present

## 2015-05-09 DIAGNOSIS — I10 Essential (primary) hypertension: Secondary | ICD-10-CM | POA: Diagnosis not present

## 2015-05-09 DIAGNOSIS — R933 Abnormal findings on diagnostic imaging of other parts of digestive tract: Secondary | ICD-10-CM | POA: Diagnosis not present

## 2015-05-09 DIAGNOSIS — K224 Dyskinesia of esophagus: Secondary | ICD-10-CM | POA: Diagnosis not present

## 2015-05-09 DIAGNOSIS — R1310 Dysphagia, unspecified: Secondary | ICD-10-CM | POA: Diagnosis not present

## 2015-05-09 DIAGNOSIS — E871 Hypo-osmolality and hyponatremia: Secondary | ICD-10-CM | POA: Diagnosis not present

## 2015-05-09 LAB — BASIC METABOLIC PANEL
ANION GAP: 7 (ref 5–15)
ANION GAP: 10 (ref 5–15)
BUN: 6 mg/dL (ref 6–20)
BUN: 6 mg/dL (ref 6–20)
CALCIUM: 8 mg/dL — AB (ref 8.9–10.3)
CALCIUM: 8.9 mg/dL (ref 8.9–10.3)
CO2: 20 mmol/L — ABNORMAL LOW (ref 22–32)
CO2: 21 mmol/L — ABNORMAL LOW (ref 22–32)
Chloride: 93 mmol/L — ABNORMAL LOW (ref 101–111)
Chloride: 90 mmol/L — ABNORMAL LOW (ref 101–111)
Creatinine, Ser: 0.58 mg/dL (ref 0.44–1.00)
Creatinine, Ser: 0.63 mg/dL (ref 0.44–1.00)
GFR calc Af Amer: >60 mL/min (ref >60)
Glucose, Bld: 109 mg/dL — ABNORMAL HIGH (ref 65–99)
Glucose, Bld: 124 mg/dL — ABNORMAL HIGH (ref 65–99)
POTASSIUM: 3.4 mmol/L — AB (ref 3.5–5.1)
POTASSIUM: 4 mmol/L (ref 3.5–5.1)
SODIUM: 120 mmol/L — AB (ref 135–145)
SODIUM: 121 mmol/L — AB (ref 135–145)

## 2015-05-09 LAB — CBC
HEMATOCRIT: 36.1 % (ref 36.0–46.0)
HEMOGLOBIN: 12.6 g/dL (ref 12.0–15.0)
MCH: 27.8 pg (ref 26.0–34.0)
MCHC: 34.9 g/dL (ref 30.0–36.0)
MCV: 79.7 fL (ref 78.0–100.0)
Platelets: 415 10*3/uL — ABNORMAL HIGH (ref 150–400)
RBC: 4.53 MIL/uL (ref 3.87–5.11)
RDW: 13.4 % (ref 11.5–15.5)
WBC: 12.4 10*3/uL — AB (ref 4.0–10.5)

## 2015-05-09 LAB — URIC ACID: URIC ACID, SERUM: 2 mg/dL — AB (ref 2.3–6.6)

## 2015-05-09 LAB — TSH: TSH: 4.823 u[IU]/mL — AB (ref 0.350–4.500)

## 2015-05-09 MED ORDER — TRAMADOL HCL 50 MG PO TABS
50.0000 mg | ORAL_TABLET | Freq: Four times a day (QID) | ORAL | Status: DC | PRN
  Administered 2015-05-09 – 2015-05-11 (×6): 50 mg via ORAL
  Filled 2015-05-09 (×6): qty 1

## 2015-05-09 MED ORDER — NEPAFENAC 0.3 % OP SUSP
1.0000 [drp] | Freq: Every day | OPHTHALMIC | Status: DC
  Administered 2015-05-09 – 2015-05-11 (×3): 1 [drp] via OPHTHALMIC

## 2015-05-09 MED ORDER — NEPAFENAC 0.3 % OP SUSP: 1.0000 [drp] | Freq: Every day | OPHTHALMIC | Status: DC

## 2015-05-09 MED ORDER — BESIFLOXACIN HCL 0.6 % OP SUSP
1.0000 [drp] | Freq: Three times a day (TID) | OPHTHALMIC | Status: DC
Start: 2015-05-10 — End: 2015-05-12
  Administered 2015-05-10 – 2015-05-12 (×8): 1 [drp] via OPHTHALMIC

## 2015-05-09 MED ORDER — NEPAFENAC 0.3 % OP SUSP
1.0000 [drp] | Freq: Every day | OPHTHALMIC | Status: DC
Start: 2015-05-10 — End: 2015-05-09

## 2015-05-09 MED ORDER — NEPAFENAC 0.3 % OP SUSP
1.0000 [drp] | Freq: Every day | OPHTHALMIC | Status: DC
  Administered 2015-05-10 – 2015-05-11 (×2): 1 [drp] via OPHTHALMIC

## 2015-05-09 MED ORDER — LIDOCAINE 5 % EX PTCH
1.0000 | MEDICATED_PATCH | CUTANEOUS | Status: DC
  Administered 2015-05-09 – 2015-05-12 (×4): 1 via TRANSDERMAL
  Filled 2015-05-09 (×4): qty 1

## 2015-05-09 MED ORDER — DIFLUPREDNATE 0.05 % OP EMUL
1.0000 [drp] | Freq: Three times a day (TID) | OPHTHALMIC | Status: DC
Start: 2015-05-10 — End: 2015-05-12
  Administered 2015-05-10 – 2015-05-12 (×7): 1 [drp] via OPHTHALMIC

## 2015-05-09 MED ORDER — DIFLUPREDNATE 0.05 % OP EMUL: 1.0000 [drp] | Freq: Every day | OPHTHALMIC | Status: DC

## 2015-05-09 MED ORDER — AMLODIPINE BESYLATE 5 MG PO TABS
5.0000 mg | ORAL_TABLET | Freq: Every day | ORAL | Status: DC
  Administered 2015-05-09 – 2015-05-12 (×4): 5 mg via ORAL
  Filled 2015-05-09 (×4): qty 1

## 2015-05-09 MED ORDER — NEPAFENAC 0.3 % OP SUSP
1.0000 [drp] | Freq: Every day | OPHTHALMIC | Status: DC
  Administered 2015-05-09: 1 [drp] via OPHTHALMIC

## 2015-05-09 MED ORDER — DIFLUPREDNATE 0.05 % OP EMUL
1.0000 [drp] | Freq: Two times a day (BID) | OPHTHALMIC | Status: DC
Start: 2015-05-13 — End: 2015-05-12

## 2015-05-09 NOTE — Progress Notes (Signed)
Occupational Therapy Treatment Patient Details Name: Cathy Silva MRN: [id number] DOB: [birth year] Today's Date: 05/09/2015    History of present illness Pt admitted with back pain. Acute onset. Started 5 days ago this patient was getting into her bed. Mid back without radiation. Denies any loss of function in her lower extremities or loss of bowel or bladder function. Fairly constant with waxing and waning nature. Worse with certain movements. Denies any dysuria or frequency. Denies fevers. Associate with nausea. Diarrhea 1 one day ago. Vomiting 1 in ED that was nonbilious non-bloody. 200 mg of ibuprofen daily without improvement. 400 mg of ibuprofen 1 on day of admission with some improvement. MRI revealed T7, L1 & L2 compression fractures.   OT comments  Pt progressing. Education provided in session. Continue to recommend [place].  Follow Up Recommendations  Home health OT;Supervision - Intermittent    Equipment Recommendations  3 in 1 bedside comode    Recommendations for Other Services      Precautions / Restrictions Precautions Precautions: Fall (back precautions for comfort) Restrictions Weight Bearing Restrictions: No       Mobility Bed Mobility Overal bed mobility: Needs Assistance Bed Mobility: Rolling;Sidelying to Sit Rolling: Supervision Sidelying to sit: Supervision       General bed mobility comments: cues for log roll technique.  Transfers Overall transfer level: Needs assistance   Transfers: Sit to/from Stand Sit to Stand: Min assist         General transfer comment: assist to boost from bed and regular height toilet.     Balance    Used RW for ambulation. Able to perform functional tasks while standing without LOB.                               ADL Overall ADL's : Needs assistance/impaired     Grooming: Applying deodorant;Set up;Supervision/safety;Sitting;Standing;Wash/dry face;Oral care   Upper Body Bathing: Set  up;Supervision/ safety;Sitting Upper Body Bathing Details (indicate cue type and reason): washed part of UB         Lower Body Dressing: Set up;Sitting/lateral leans   Toilet Transfer: Minimal assistance;Ambulation;RW;Regular Toilet (assist to boost from toilet)   [place] and Hygiene: Supervision/safety;Sit to/from stand       Functional mobility during ADLs: Min guard;Rolling walker General ADL Comments: Discussed tips for her to help with her back and to help her with good body mechanics such as use of cup for oral care and placement of grooming items. Discussed crossing legs over knees vs bending all the way down to feet. Talked about AE. Pt went and retrieved bathing/grooming items in room with Min guard assist. OT assisted in handing pt some items in session when she was at sink-pt reported lightheadedness.      Vision                     Perception     Praxis      Cognition  Awake/Alert Behavior During Therapy: WFL for tasks assessed/performed Overall Cognitive Status: Within Functional Limits for tasks assessed                       Extremity/Trunk Assessment               Exercises     Shoulder Instructions       General Comments      Pertinent Vitals/ Pain  Pain Assessment: Faces Faces Pain Scale: Hurts even more Pain Location: chest and back Pain Descriptors / Indicators: Grimacing Pain Intervention(s): Monitored during session  Home Living                                          Prior Functioning/Environment              Frequency Min 2X/week     Progress Toward Goals  OT Goals(current goals can now be found in the care plan section)  Progress towards OT goals: Progressing toward goals  Acute Rehab OT Goals Patient Stated Goal: not stated OT Goal Formulation: With patient Time For Goal Achievement: 05/15/15 Potential to Achieve Goals: Good ADL Goals Pt Will  Perform Grooming: with modified independence;standing Pt Will Perform Lower Body Bathing: with modified independence;with adaptive equipment;sit to/from stand Pt Will Perform Lower Body Dressing: with modified independence;with adaptive equipment;sit to/from stand Pt Will Transfer to Toilet: with modified independence;ambulating;regular height toilet;grab bars Pt Will Perform Toileting - Clothing Manipulation and hygiene: with modified independence;sit to/from stand  Plan Discharge plan needs to be updated    Co-evaluation                 End of Session Equipment Utilized During Treatment: Gait belt;Rolling walker   Activity Tolerance Other (comment) (reported being lightheaded)   Patient Left in chair;with call bell/phone within reach;with chair alarm set   Nurse Communication          Time: [phone] (some time spent urinating on toilet) OT Time Calculation (min): 22 min  Charges: OT General Charges $OT Visit: 1 Procedure OT Treatments $Self Care/Home Management : 8-22 mins  Benito Mccreedy OTR/L [id number] 05/09/2015, 12:57 PM

## 2015-05-09 NOTE — Progress Notes (Signed)
Speech Language Pathology Treatment: Dysphagia  Patient Details Name: Cathy Silva MRN: [id number] DOB: [birth year] Today's Date: 05/09/2015 Time: [id number] SLP Time Calculation (min) (ACUTE ONLY): 25 min  Assessment / Plan / Recommendation Clinical Impression  Pt seen for dysphagia treatment with niece at bedside. Pt had barium esophagram this morning revealing significant esophageal dysmotility with stasis of the barium bolus within the esophagus and to and fro motion in the high cervical esophagus. Pt consumed thin liquids via cup with delayed cough x 1 indicative of a possible reflux cough or LPR (laryngopharyngeal reflux). SLP provided in depth verbal and written education re: esophageal precautions with clinical reasoning with understanding. Recommend continue Dys 3 texture for ease of pharyngeal and esophageal transit, thin liquids, no straws, sit upright min 30 minutes after meals. ST will follow up x 1 while in hospital.    HPI HPI: Pt admit with back pain. Acute onset. Started 5 days ago this patient was getting into her bed. Mid back without radiation. Denies any loss of function in her lower extremities or loss of bowel or bladder function. Fairly constant with waxing and waning nature. Worse with certain movements. Denies any dysuria or frequency. Denies fevers. Associate with nausea. Diarrhea 1 one day ago. Vomiting 1 in ED that was nonbilious non-bloody. 200 mg of ibuprofen daily without improvement. 400 mg of ibuprofen 1 on day of admission with some improvement.Pt has been reporting difficulty swallowing with reduced PO intake. She says that in the past she has been told that she has a hiatal hernia.      SLP Plan  Continue with current plan of care     Recommendations  Diet recommendations: Dysphagia 3 (mechanical soft);Thin liquid Liquids provided via: Cup;No straw Medication Administration: Whole meds with puree Supervision: Patient able to self feed Compensations:  Slow rate;Small sips/bites;Follow solids with liquid (rest breaks if needed) Postural Changes and/or Swallow Maneuvers: Seated upright 90 degrees;Upright 30-60 min after meal              Oral Care Recommendations: Oral care BID Follow up Recommendations: None Plan: Continue with current plan of care   Houston Siren 05/09/2015, 10:41 AM  Orbie Pyo Colvin Caroli.Ed [employer] [phone]

## 2015-05-09 NOTE — Progress Notes (Signed)
[facility]                                                                              Patient Demographics  Cathy Silva, is a 82 y.o. female, DOB - 08-07-1929, [id number]  Admit date - 05/07/2015   Admitting Physician Waldemar Dickens, MD  Outpatient Primary MD for the patient is [name], MD  LOS -    Chief Complaint  Patient presents with  . Back Pain       Brief HPI   HPI: Cathy Silva is a 82 y.o. female with no significant past medical history presented with back pain. Back pain. Acute onset. Started 5 days ago this patient was getting into her bed. Mid back without radiation. Denied any loss of function in her lower extremities or loss of bowel or bladder function. Fairly constant with waxing and waning nature. Worse with certain movements. Denied any fevers, dysuria or frequency. She also reported nausea with diarrhea 1 day prior to admission, vomiting 1 in ED. Patient was admitted for further workup.   Assessment & Plan    Active Problems: Hyponatremia:121 on admission, suspect chronic in nature.  -  Pt lives at home and cooks for self. States she drinks water and never puts salt on food. Patient has had no vomiting or diarrhea while inpatient - Sodium improved to 126, now down to 120 again, was on IV fluids. - Serum osmolarity 251, urine osmolarity 302, urine sodium 41 - Obtain TSH, uric acid, possibly may have underlying SIADH - Place on fluid restriction, obtain BMET later today, if no significant improvement or trending down, will consult with nephrology  Nausea, Vomiting, Diarrhea: likely mild viral gastroenteritis - CT abdomen and pelvis negative for any acute abdominal pathology.   Dysphagia - Swallow study-> esophageal dysphagia - Esophagogram shows severe dysmotility - GI consult called, Dr. Benson Norway, continue PPI   Thoracic Back pain: likely MSK. No radiation. Currently improved - UA negative for UTI - Thoracic spine  x-ray shows T7 compression fracture  - Continue PT, pain control, Lidoderm patch   Hypertension: No previous h/o HTN. Suspect acute illness and stress.  Placed on Norvasc 5 mg daily, hydralazine as needed   Pulmonary nodules incidentally seen on CT chest - Patient recommended repeat CT chest in 6-12 months for follow-up outpatient   Code Status: Full CODE STATUS  Family Communication: Discussed in detail with the patient, all imaging results, lab results explained to the patient    Disposition Plan:   Time Spent in minutes   25 minutes  Procedures  CT angiogram chest, abdomen Esophagogram   Consults Gastroenterology   DVT Prophylaxis  Lovenox   Medications  Scheduled Meds: . acidophilus  2 capsule Oral Daily  . Besifloxacin HCl  1 drop Ophthalmic TID WC  . Difluprednate  1 drop Right Eye Daily  . Difluprednate  1 drop Left Eye TID   Followed by  . [START ON 05/13/2015] Difluprednate  1 drop Left Eye BID   Followed by  . [START ON 05/20/2015] Difluprednate  1 drop Left Eye BID  . enoxaparin (LOVENOX) injection  40 mg Subcutaneous  Q24H  . lidocaine  1 patch Transdermal Q24H  . nepafenac  1 drop Right Eye Daily  . nepafenac  1 drop Left Eye Daily  . pantoprazole  40 mg Oral Q0600  . potassium chloride  40 mEq Oral Daily  . sodium chloride  3 mL Intravenous Q12H   Continuous Infusions: . sodium chloride 75 mL/hr at 05/09/15 0442   PRN Meds:.acetaminophen **OR** acetaminophen, hydrALAZINE, methocarbamol, ondansetron **OR** ondansetron (ZOFRAN) IV, traMADol   Antibiotics   Anti-infectives    None        Subjective:   Cathy Silva was seen and examined today.   feeling somewhat nauseous, back pain is improving. No fevers or chills or any diarrhea.  Patient denies dizziness, chest pain, shortness of breath, new weakness, numbess, tingling. No acute events overnight.    Objective:   Blood pressure 136/46, pulse 85, temperature 98.7 F (37.1 C),  temperature source Oral, resp. rate 20, height 5\' 6"  (1.676 m), weight 74.072 kg (163 lb 4.8 oz), last menstrual period 05/31/1987, SpO2 96 %.  Wt Readings from Last 3 Encounters:  05/09/15 74.072 kg (163 lb 4.8 oz)  07/20/14 75.297 kg (166 lb)  07/12/13 75.751 kg (167 lb)     Intake/Output Summary (Last 24 hours) at 05/09/15 1052 Last data filed at 05/09/15 0950  Gross per 24 hour  Intake 3833.75 ml  Output    925 ml  Net 2908.75 ml    Exam  General: Alert and oriented x 3, NAD  HEENT:  PERRLA, EOMI  Neck: Supple, no JVD, no masses  CVS: S1 S2clear, RRR  Respiratory: Clear to auscultation bilaterally, no wheezing, rales or rhonchi  Abdomen: Soft, nontender, nondistended, + bowel sounds  Ext: no cyanosis clubbing or edema  Neuro: no deficit   Skin: No rashes  Psych: Normal affect and demeanor, alert and oriented x3    Data Review   Micro Results No results found for this or any previous visit (from the past 240 hour(s)).  Radiology Reports Dg Chest 2 View  05/07/2015  CLINICAL DATA:  Mid back pain since Wednesday. New dizziness was shortness of breath and some nausea last night. EXAM: CHEST  2 VIEW COMPARISON:  Chest CT performed earlier same day. FINDINGS: Heart size is upper normal. Overall cardiomediastinal silhouette is within normal limits in size and configuration, with associated age-related ectasia of the thoracic aorta. Mild scarring versus atelectasis at each lung base, better seen on earlier chest CT. Lungs otherwise clear. No confluent opacity to suggest a developing pneumonia. No pleural effusions seen. No pneumothorax seen. There is a compression fracture deformity of a mid thoracic vertebral body, labeled T7 on earlier CT, of uncertain age but most likely chronic. There is also a mild dextroscoliosis of the lower thoracic spine. Mild degenerative change noted within the thoracic spine. No acute-appearing osseous abnormality. IMPRESSION: Mild scarring  versus atelectasis at each lung base, better seen on earlier chest CT. Lungs are otherwise clear and there is no evidence of acute cardiopulmonary abnormality. Compression fracture deformity of the T7 vertebral body, of uncertain age, but most likely old, also better seen on earlier CT. Electronically Signed   By: Franki Cabot M.D.   On: 05/07/2015 12:24   Dg Thoracic Spine 2 View  05/08/2015  CLINICAL DATA:  Sudden onset of back pain while getting into bed. EXAM: THORACIC SPINE 2 VIEWS COMPARISON:  Chest radiograph and chest CT, 05/07/2015 FINDINGS: Moderate compression fracture of T7, unchanged from the previous day's exams.  No other thoracic fractures. Fractures of L1 and L2 are can, also stable from the prior CT. No bone lesion. Bones are demineralized. Minor degenerative endplate spurring is noted along the mid and lower thoracic spine Soft tissues are unremarkable. IMPRESSION: 1. Moderate compression fracture of T7, with compression fractures also noted at of L1 and L2, unchanged from the previous day's CT scan. 2. No other fractures.  No lesions.  Minor degenerative changes. Electronically Signed   By: Lajean Manes M.D.   On: 05/08/2015 12:56   Ct Angio Abdomen W/cm &/or Wo Contrast  05/07/2015  CLINICAL DATA:  82 year old female with mid back pain, hypertension and nausea/vomiting EXAM: CT ANGIOGRAPHY CHEST, ABDOMEN AND PELVIS TECHNIQUE: Multidetector CT imaging through the chest, abdomen and pelvis was performed using the standard protocol during bolus administration of intravenous contrast. Multiplanar reconstructed images and MIPs were obtained and reviewed to evaluate the vascular anatomy. CONTRAST:  24mL OMNIPAQUE IOHEXOL 350 MG/ML SOLN COMPARISON:  Prior MRI lumbar spine 04/18/2012 FINDINGS: CTA CHEST FINDINGS Mediastinum: Unremarkable CT appearance of the thyroid gland. No suspicious mediastinal or hilar adenopathy. No soft tissue mediastinal mass. The thoracic esophagus is unremarkable.  Heart/Vascular: No evidence of acute intramural hematoma on the initial non contrasted images. Following the administration of intravenous contrast. There is a conventional 3 vessel arch morphology. No evidence of aneurysm or acute dissection. Mild atherosclerotic vascular calcifications without significant stenosis. Calcifications present along the course of the left anterior descending, circumflex and right coronary arteries. Concentric hypertrophy of the left ventricle. The heart is at the upper limits of normal for size. No pericardial effusion. Normal caliber main and central pulmonary arteries. No evidence of acute pulmonary embolus. Lungs/Pleura: 5-6 mm nodule in the superior aspect of the right middle lobe (image 27 series 5). Adjacent 3 mm ground-glass attenuation nodule on the same image. 4 mm nodule slightly more inferior on image 31. Dependent atelectasis in both lower lobes. 3 mm ground-glass attenuation nodular opacity in the left lung apex on image 7 of series 5. Probable 5 mm pulmonary nodule in the left lower lobe (image 45 series 5). No evidence of focal airspace consolidation, pulmonary edema, pleural effusion or pneumothorax. Bones/Soft Tissues: No acute fracture or aggressive appearing lytic or blastic osseous lesion. T7 compression fracture with approximately 50% height loss anteriorly. Review of the MIP images confirms the above findings. CTA ABDOMEN AND PELVIS FINDINGS VASCULAR Aorta: Tortuous but normal caliber abdominal aorta. No evidence of acute dissection. Scattered calcified atherosclerotic plaque. No significant or irregular mural thrombus. Celiac: Variant anatomy. The splenic and left gastric artery arise from a common trunk. The proper hepatic artery arises independently from the aorta. Atherosclerotic plaque at the origin of both vessels results in at least mild narrowing. No visceral artery aneurysm. SMA: Widely patent and unremarkable. Renals: Single dominant renal arteries  bilaterally which are widely patent without evidence of fibromuscular dysplasia. Accessory renal artery to the lower pole of the left kidney. IMA: Atherosclerotic plaque results in at least mild narrowing of the proximal vessel. The distal vessel remains patent. Inflow: Tortuous and mildly ectatic but no aneurysm, dissection or high-grade stenosis. Proximal Outflow: Visualized portions are relatively disease free. Veins: No focal venous abnormality. NON-VASCULAR Abdomen: Moderate respiratory motion artifact in the upper abdomen. Unremarkable CT appearance of the stomach, duodenum, spleen, adrenal glands and pancreas. Normal hepatic contour and morphology. No discrete hepatic lesion. Gallbladder is unremarkable. No intra or extrahepatic biliary ductal dilatation. Unremarkable appearance of the bilateral kidneys. No focal solid lesion,  hydronephrosis or nephrolithiasis. Colonic diverticular disease without CT evidence of active inflammation. The colon is decompressed. No focal bowel wall thickening or evidence of obstruction. No free fluid or suspicious adenopathy. Pelvis: The uterus is surgically absent. The bladder is dilated with urine. No free fluid or suspicious adenopathy. Bones/Soft Tissues: The bones appear diffusely demineralized. There is no evidence of acute fracture, malalignment or aggressive lytic or blastic osseous lesion. Advanced multilevel degenerative disc disease. Lower lumbar facet arthropathy. L1 and L2 compression fractures without significant interval change compared to prior MRI. Review of the MIP images confirms the above findings. IMPRESSION: CTA CHEST 1. No acute dissection, aneurysm or other evidence of acute aortic syndrome. 2. Atherosclerotic vascular calcifications including 3 vessel coronary artery calcifications. 3. Concentric hypertrophy of the left ventricle. Does the patient have a clinical history of systemic arterial hypertension? 4. Multiple pulmonary nodules measuring up to 5  mm. While these are highly likely (in the absence of a known primary malignancy. If the patient has a known primary malignancy then metastatic disease would be more likely) the sequelae of a prior infectious/inflammatory or granulomatous process, stability cannot be confirmed without prior imaging for comparison. Consider follow-up CT scan of the chest in 6-12 months if the documentation of stability is clinically warranted. 5. Age indeterminate (without prior imaging for comparison) but likely chronic T7 compression fracture. CTA ABD/PELVIS 1. Moderate respiratory/patient motion artifact. 2. No evidence of acute aortic dissection, aneurysm or other acute vascular abnormality. 3. Incidental note is made of variant anatomy of the celiac artery origin. 4. Atherosclerotic vascular calcifications without significant stenosis to suggest a source for chronic mesenteric ischemia. 5. Colonic diverticular disease without CT evidence of active inflammation. 6. Chronic L1 and L2 compression fractures without evidence of progressive height loss compared to 04/18/2012. Signed, Criselda Peaches, MD [facility] Radiology Electronically Signed   By: Jacqulynn Cadet M.D.   On: 05/07/2015 11:40   Dg Esophagus  05/09/2015  CLINICAL DATA:  Frequent eructation with liquid intake, globus sensation, and reported feeling of food getting "stuck" in her chest. She does have delayed coughing throughout intake, although vocal quality remains clear. Suspect that this is related to possible esophageal issues, although also cannot rule out backflow into the pharynx / larynx post-swallow EXAM: ESOPHOGRAM/BARIUM SWALLOW TECHNIQUE: Single contrast examination was performed using  thin barium. FLUOROSCOPY TIME:  Radiation Exposure Index (as provided by the fluoroscopic device): If the device does not provide the exposure index: Fluoroscopy Time:  36 secs Number of Acquired Images:  3  COMPARISON:  CT chest 05/07/2015 FINDINGS: The first swallow of barium passed quickly into the stomach. Subsequent sips remain static within the esophagus. There was to and fro motion of the barium bolus into the high thoracic esophagus. There was little peristaltic wave action and no tertiary contractions. No retained food stuff within the esophagus. No esophageal dilatation. IMPRESSION: 1. Significant esophageal dysmotility with stasis of the barium bolus within the esophagus and to and fro motion in the high cervical esophagus. No tertiary contractions. 2. No evidence obstruction or retained food stuff. Electronically Signed   By: Suzy Bouchard M.D.   On: 05/09/2015 09:19   Ct Angio Chest Aorta W/cm &/or Wo/cm  05/07/2015  CLINICAL DATA:  83 year old female with mid back pain, hypertension and nausea/vomiting EXAM: CT ANGIOGRAPHY CHEST, ABDOMEN AND PELVIS TECHNIQUE: Multidetector CT imaging through the chest, abdomen and pelvis was performed using the standard protocol during bolus administration of intravenous contrast. Multiplanar  reconstructed images and MIPs were obtained and reviewed to evaluate the vascular anatomy. CONTRAST:  39mL OMNIPAQUE IOHEXOL 350 MG/ML SOLN COMPARISON:  Prior MRI lumbar spine 04/18/2012 FINDINGS: CTA CHEST FINDINGS Mediastinum: Unremarkable CT appearance of the thyroid gland. No suspicious mediastinal or hilar adenopathy. No soft tissue mediastinal mass. The thoracic esophagus is unremarkable. Heart/Vascular: No evidence of acute intramural hematoma on the initial non contrasted images. Following the administration of intravenous contrast. There is a conventional 3 vessel arch morphology. No evidence of aneurysm or acute dissection. Mild atherosclerotic vascular calcifications without significant stenosis. Calcifications present along the course of the left anterior descending, circumflex and right coronary arteries. Concentric hypertrophy of the left ventricle. The heart is at  the upper limits of normal for size. No pericardial effusion. Normal caliber main and central pulmonary arteries. No evidence of acute pulmonary embolus. Lungs/Pleura: 5-6 mm nodule in the superior aspect of the right middle lobe (image 27 series 5). Adjacent 3 mm ground-glass attenuation nodule on the same image. 4 mm nodule slightly more inferior on image 31. Dependent atelectasis in both lower lobes. 3 mm ground-glass attenuation nodular opacity in the left lung apex on image 7 of series 5. Probable 5 mm pulmonary nodule in the left lower lobe (image 45 series 5). No evidence of focal airspace consolidation, pulmonary edema, pleural effusion or pneumothorax. Bones/Soft Tissues: No acute fracture or aggressive appearing lytic or blastic osseous lesion. T7 compression fracture with approximately 50% height loss anteriorly. Review of the MIP images confirms the above findings. CTA ABDOMEN AND PELVIS FINDINGS VASCULAR Aorta: Tortuous but normal caliber abdominal aorta. No evidence of acute dissection. Scattered calcified atherosclerotic plaque. No significant or irregular mural thrombus. Celiac: Variant anatomy. The splenic and left gastric artery arise from a common trunk. The proper hepatic artery arises independently from the aorta. Atherosclerotic plaque at the origin of both vessels results in at least mild narrowing. No visceral artery aneurysm. SMA: Widely patent and unremarkable. Renals: Single dominant renal arteries bilaterally which are widely patent without evidence of fibromuscular dysplasia. Accessory renal artery to the lower pole of the left kidney. IMA: Atherosclerotic plaque results in at least mild narrowing of the proximal vessel. The distal vessel remains patent. Inflow: Tortuous and mildly ectatic but no aneurysm, dissection or high-grade stenosis. Proximal Outflow: Visualized portions are relatively disease free. Veins: No focal venous abnormality. NON-VASCULAR Abdomen: Moderate respiratory  motion artifact in the upper abdomen. Unremarkable CT appearance of the stomach, duodenum, spleen, adrenal glands and pancreas. Normal hepatic contour and morphology. No discrete hepatic lesion. Gallbladder is unremarkable. No intra or extrahepatic biliary ductal dilatation. Unremarkable appearance of the bilateral kidneys. No focal solid lesion, hydronephrosis or nephrolithiasis. Colonic diverticular disease without CT evidence of active inflammation. The colon is decompressed. No focal bowel wall thickening or evidence of obstruction. No free fluid or suspicious adenopathy. Pelvis: The uterus is surgically absent. The bladder is dilated with urine. No free fluid or suspicious adenopathy. Bones/Soft Tissues: The bones appear diffusely demineralized. There is no evidence of acute fracture, malalignment or aggressive lytic or blastic osseous lesion. Advanced multilevel degenerative disc disease. Lower lumbar facet arthropathy. L1 and L2 compression fractures without significant interval change compared to prior MRI. Review of the MIP images confirms the above findings. IMPRESSION: CTA CHEST 1. No acute dissection, aneurysm or other evidence of acute aortic syndrome. 2. Atherosclerotic vascular calcifications including 3 vessel coronary artery calcifications. 3. Concentric hypertrophy of the left ventricle. Does the patient have a clinical history of systemic  arterial hypertension? 4. Multiple pulmonary nodules measuring up to 5 mm. While these are highly likely (in the absence of a known primary malignancy. If the patient has a known primary malignancy then metastatic disease would be more likely) the sequelae of a prior infectious/inflammatory or granulomatous process, stability cannot be confirmed without prior imaging for comparison. Consider follow-up CT scan of the chest in 6-12 months if the documentation of stability is clinically warranted. 5. Age indeterminate (without prior imaging for comparison) but  likely chronic T7 compression fracture. CTA ABD/PELVIS 1. Moderate respiratory/patient motion artifact. 2. No evidence of acute aortic dissection, aneurysm or other acute vascular abnormality. 3. Incidental note is made of variant anatomy of the celiac artery origin. 4. Atherosclerotic vascular calcifications without significant stenosis to suggest a source for chronic mesenteric ischemia. 5. Colonic diverticular disease without CT evidence of active inflammation. 6. Chronic L1 and L2 compression fractures without evidence of progressive height loss compared to 04/18/2012. Signed, Criselda Peaches, MD [facility] Radiology Electronically Signed   By: Jacqulynn Cadet M.D.   On: 05/07/2015 11:40    CBC  Recent Labs Lab 05/07/15 0945 05/07/15 1004 05/08/15 0534 05/09/15 0538  WBC 11.8*  --  11.3* 12.4*  HGB 12.9 14.6 12.0 12.6  HCT 37.7 43.0 35.3* 36.1  PLT 379  --  384 415*  MCV 80.2  --  80.8 79.7  MCH 27.4  --  27.5 27.8  MCHC 34.2  --  34.0 34.9  RDW 13.1  --  13.2 13.4    Chemistries   Recent Labs Lab 05/07/15 0945 05/07/15 1004 05/07/15 2206 05/08/15 0534 05/09/15 0538  NA 121* 124* 124* 126* 120*  K 3.6 3.5 3.6 3.2* 3.4*  CL 88* 90* 93* 93* 93*  CO2 19*  --  20* 24 20*  GLUCOSE 124* 125* 117* 118* 109*  BUN 7 7 7  <5* 6  CREATININE 0.52 0.40* 0.61 0.58 0.58  CALCIUM 8.7*  --  8.4* 8.3* 8.0*  AST 21  --   --   --   --   ALT 15  --   --   --   --   ALKPHOS 71  --   --   --   --   BILITOT 0.9  --   --   --   --    ------------------------------------------------------------------------------------------------------------------ estimated creatinine clearance is 54.8 mL/min (by C-G formula based on Cr of 0.58). ------------------------------------------------------------------------------------------------------------------ No results for input(s): HGBA1C in the last 72  hours. ------------------------------------------------------------------------------------------------------------------ No results for input(s): CHOL, HDL, LDLCALC, TRIG, CHOLHDL, LDLDIRECT in the last 72 hours. ------------------------------------------------------------------------------------------------------------------ No results for input(s): TSH, T4TOTAL, T3FREE, THYROIDAB in the last 72 hours.  Invalid input(s): FREET3 ------------------------------------------------------------------------------------------------------------------ No results for input(s): VITAMINB12, FOLATE, FERRITIN, TIBC, IRON, RETICCTPCT in the last 72 hours.  Coagulation profile No results for input(s): INR, PROTIME in the last 168 hours.  No results for input(s): DDIMER in the last 72 hours.  Cardiac Enzymes No results for input(s): CKMB, TROPONINI, MYOGLOBIN in the last 168 hours.  Invalid input(s): CK ------------------------------------------------------------------------------------------------------------------ Invalid input(s): POCBNP  No results for input(s): GLUCAP in the last 72 hours.   Cathy Silva M.D. [facility] 05/09/2015, 10:52 AM  Pager: [phone] Between 7am to 7pm - call Pager - [phone]  After 7pm go to [email or web address] - password [email or web address]  Call night coverage person covering after 7pm

## 2015-05-09 NOTE — Consult Note (Signed)
Reason for Consult: Dysphagia Referring Physician: [facility]  Atha L Richman HPI: This is an 82 year old female admitted with complaints of back pain.  Further evaluation revealed that she was significantly hyponatremic, which was initially improving.  Unfortunately he has declined back to 120.  During the intake evaluation she complained of dysphagia issues.  A CT scan of the chest and abdomen were normal, but further evaluation with an esophagram revealed significant dysmotility.  There was to and fro motion of the bolus in the mid esophagus.  Speech Pathology evaluated the patient and currently has her on a Dysphagia 3 diet and taught her about chewing and swallowing properly.  Also, at the time of admission she suffered from diarrhea, nausea, and vomiting, which appears to have resolved.  Currently she reports that she cannot swallow well today, despite Speech Path recommendations.  Past Medical History  Diagnosis Date  . Melanoma in situ ([place]) 2009  . BCC (basal cell carcinoma), arm 2008    Left     Past Surgical History  Procedure Laterality Date  . Total abdominal hysterectomy w/ bilateral salpingoophorectomy  05/1987    Leiomyoma  . Dilation and curettage of uterus  05/1984    Prolif  . Dilation and curettage of uterus  06/1986    benign  . Eye surgery      bilat cataract  . Tonsillectomy      Family History  Problem Relation Age of Onset  . Heart attack Maternal Grandmother     Social History:  reports that she has never smoked. She does not have any smokeless tobacco history on file. She reports that she does not drink alcohol or use illicit drugs.  Allergies:  Allergies  Allergen Reactions  . Codeine Other (See Comments)    Unknown reaction  . Other     novacane  . Pneumococcal Vaccines Other (See Comments)    Unknown reaction  . Pseudoephedrine Other (See Comments)    Unknown reaction  . Tetanus Toxoids     Swelling at injecton site    Medications:   Scheduled: . acidophilus  2 capsule Oral Daily  . amLODipine  5 mg Oral Daily  . Besifloxacin HCl  1 drop Ophthalmic TID WC  . Difluprednate  1 drop Right Eye Daily  . Difluprednate  1 drop Left Eye TID   Followed by  . [START ON 05/13/2015] Difluprednate  1 drop Left Eye BID   Followed by  . [START ON 05/20/2015] Difluprednate  1 drop Left Eye BID  . enoxaparin (LOVENOX) injection  40 mg Subcutaneous Q24H  . lidocaine  1 patch Transdermal Q24H  . nepafenac  1 drop Right Eye Daily  . nepafenac  1 drop Left Eye Daily  . pantoprazole  40 mg Oral Q0600  . potassium chloride  40 mEq Oral Daily  . sodium chloride  3 mL Intravenous Q12H   Continuous:   Results for orders placed or performed during the hospital encounter of 05/07/15 (from the past 24 hour(s))  Basic metabolic panel     Status: Abnormal   Collection Time: 05/09/15  5:38 AM  Result Value Ref Range   Sodium 120 (L) 135 - 145 mmol/L   Potassium 3.4 (L) 3.5 - 5.1 mmol/L   Chloride 93 (L) 101 - 111 mmol/L   CO2 20 (L) 22 - 32 mmol/L   Glucose, Bld 109 (H) 65 - 99 mg/dL   BUN 6 6 - 20 mg/dL   Creatinine, Ser  0.58 0.44 - 1.00 mg/dL   Calcium 8.0 (L) 8.9 - 10.3 mg/dL   GFR calc non Af Amer >60 >60 mL/min   GFR calc Af Amer >60 >60 mL/min   Anion gap 7 5 - 15  CBC     Status: Abnormal   Collection Time: 05/09/15  5:38 AM  Result Value Ref Range   WBC 12.4 (H) 4.0 - 10.5 K/uL   RBC 4.53 3.87 - 5.11 MIL/uL   Hemoglobin 12.6 12.0 - 15.0 g/dL   HCT 36.1 36.0 - 46.0 %   MCV 79.7 78.0 - 100.0 fL   MCH 27.8 26.0 - 34.0 pg   MCHC 34.9 30.0 - 36.0 g/dL   RDW 13.4 11.5 - 15.5 %   Platelets 415 (H) 150 - 400 K/uL     Dg Thoracic Spine 2 View  05/08/2015  CLINICAL DATA:  Sudden onset of back pain while getting into bed. EXAM: THORACIC SPINE 2 VIEWS COMPARISON:  Chest radiograph and chest CT, 05/07/2015 FINDINGS: Moderate compression fracture of T7, unchanged from the previous day's exams. No other thoracic fractures. Fractures of  L1 and L2 are can, also stable from the prior CT. No bone lesion. Bones are demineralized. Minor degenerative endplate spurring is noted along the mid and lower thoracic spine Soft tissues are unremarkable. IMPRESSION: 1. Moderate compression fracture of T7, with compression fractures also noted at of L1 and L2, unchanged from the previous day's CT scan. 2. No other fractures.  No lesions.  Minor degenerative changes. Electronically Signed   By: Lajean Manes M.D.   On: 05/08/2015 12:56   Dg Esophagus  05/09/2015  CLINICAL DATA:  Frequent eructation with liquid intake, globus sensation, and reported feeling of food getting "stuck" in her chest. She does have delayed coughing throughout intake, although vocal quality remains clear. Suspect that this is related to possible esophageal issues, although also cannot rule out backflow into the pharynx / larynx post-swallow EXAM: ESOPHOGRAM/BARIUM SWALLOW TECHNIQUE: Single contrast examination was performed using  thin barium. FLUOROSCOPY TIME:  Radiation Exposure Index (as provided by the fluoroscopic device): If the device does not provide the exposure index: Fluoroscopy Time:  36 secs Number of Acquired Images:  3 COMPARISON:  CT chest 05/07/2015 FINDINGS: The first swallow of barium passed quickly into the stomach. Subsequent sips remain static within the esophagus. There was to and fro motion of the barium bolus into the high thoracic esophagus. There was little peristaltic wave action and no tertiary contractions. No retained food stuff within the esophagus. No esophageal dilatation. IMPRESSION: 1. Significant esophageal dysmotility with stasis of the barium bolus within the esophagus and to and fro motion in the high cervical esophagus. No tertiary contractions. 2. No evidence obstruction or retained food stuff. Electronically Signed   By: Suzy Bouchard M.D.   On: 05/09/2015 09:19    ROS:  As stated above in the HPI otherwise negative.  Blood pressure  174/80, pulse 77, temperature 98 F (36.7 C), temperature source Oral, resp. rate 20, height 5\' 6"  (1.676 m), weight 74.072 kg (163 lb 4.8 oz), last menstrual period 05/31/1987, SpO2 98 %.    PE: Gen: NAD, Alert and Oriented HEENT:  [place]/AT, EOMI Neck: Supple, no LAD Lungs: CTA Bilaterally CV: RRR without G/R, 2/6 SEM ABM: Soft, NTND, +BS Ext: No C/C/E  Assessment/Plan: 1) Esophageal dysmotility. 2) Dysphagia. 3) Severe hyponatremia.   I am wondering if her esophageal dysmotility is as a result of her severe hyponatremia.  It is  not as common to see, but certainly a possibility.  No evidence of any strictures.  Dysmotility can also be as a result of GERD and she is on a PPI.  An EGD can be performed with emperic dilation to help "break the spasm", if dysphagia persists after correction of the hyponatremia.  Plan: 1) Correct hyponatremia. 2) If there is no change in her dysphagia with a normalization or near normalization of her sodium, an EGD will be pursued. 3) Continue with PPI.  Kynsie Falkner D 05/09/2015, 3:11 PM

## 2015-05-10 DIAGNOSIS — M4854XA Collapsed vertebra, not elsewhere classified, thoracic region, initial encounter for fracture: Secondary | ICD-10-CM | POA: Diagnosis present

## 2015-05-10 DIAGNOSIS — K208 Other esophagitis: Secondary | ICD-10-CM | POA: Diagnosis not present

## 2015-05-10 DIAGNOSIS — M549 Dorsalgia, unspecified: Secondary | ICD-10-CM | POA: Diagnosis not present

## 2015-05-10 DIAGNOSIS — R112 Nausea with vomiting, unspecified: Secondary | ICD-10-CM

## 2015-05-10 DIAGNOSIS — Z90722 Acquired absence of ovaries, bilateral: Secondary | ICD-10-CM | POA: Diagnosis not present

## 2015-05-10 DIAGNOSIS — E871 Hypo-osmolality and hyponatremia: Secondary | ICD-10-CM | POA: Diagnosis not present

## 2015-05-10 DIAGNOSIS — Z9842 Cataract extraction status, left eye: Secondary | ICD-10-CM | POA: Diagnosis not present

## 2015-05-10 DIAGNOSIS — Z9841 Cataract extraction status, right eye: Secondary | ICD-10-CM | POA: Diagnosis not present

## 2015-05-10 DIAGNOSIS — Z888 Allergy status to other drugs, medicaments and biological substances status: Secondary | ICD-10-CM | POA: Diagnosis not present

## 2015-05-10 DIAGNOSIS — E222 Syndrome of inappropriate secretion of antidiuretic hormone: Secondary | ICD-10-CM | POA: Diagnosis present

## 2015-05-10 DIAGNOSIS — Z90710 Acquired absence of both cervix and uterus: Secondary | ICD-10-CM | POA: Diagnosis not present

## 2015-05-10 DIAGNOSIS — Z79899 Other long term (current) drug therapy: Secondary | ICD-10-CM | POA: Diagnosis not present

## 2015-05-10 DIAGNOSIS — Z886 Allergy status to analgesic agent status: Secondary | ICD-10-CM | POA: Diagnosis not present

## 2015-05-10 DIAGNOSIS — A084 Viral intestinal infection, unspecified: Secondary | ICD-10-CM | POA: Diagnosis present

## 2015-05-10 DIAGNOSIS — K449 Diaphragmatic hernia without obstruction or gangrene: Secondary | ICD-10-CM | POA: Diagnosis present

## 2015-05-10 DIAGNOSIS — Z887 Allergy status to serum and vaccine status: Secondary | ICD-10-CM | POA: Diagnosis not present

## 2015-05-10 DIAGNOSIS — R1319 Other dysphagia: Secondary | ICD-10-CM | POA: Diagnosis present

## 2015-05-10 DIAGNOSIS — K210 Gastro-esophageal reflux disease with esophagitis: Secondary | ICD-10-CM | POA: Diagnosis present

## 2015-05-10 DIAGNOSIS — K224 Dyskinesia of esophagus: Secondary | ICD-10-CM | POA: Diagnosis present

## 2015-05-10 DIAGNOSIS — M546 Pain in thoracic spine: Secondary | ICD-10-CM

## 2015-05-10 DIAGNOSIS — R1310 Dysphagia, unspecified: Secondary | ICD-10-CM | POA: Diagnosis not present

## 2015-05-10 DIAGNOSIS — R933 Abnormal findings on diagnostic imaging of other parts of digestive tract: Secondary | ICD-10-CM | POA: Diagnosis not present

## 2015-05-10 DIAGNOSIS — Z8249 Family history of ischemic heart disease and other diseases of the circulatory system: Secondary | ICD-10-CM | POA: Diagnosis not present

## 2015-05-10 DIAGNOSIS — I10 Essential (primary) hypertension: Secondary | ICD-10-CM | POA: Diagnosis not present

## 2015-05-10 DIAGNOSIS — R911 Solitary pulmonary nodule: Secondary | ICD-10-CM | POA: Diagnosis present

## 2015-05-10 DIAGNOSIS — Z85820 Personal history of malignant melanoma of skin: Secondary | ICD-10-CM | POA: Diagnosis not present

## 2015-05-10 LAB — BASIC METABOLIC PANEL
Anion gap: 7 (ref 5–15)
BUN: 5 mg/dL — AB (ref 6–20)
CALCIUM: 8.7 mg/dL — AB (ref 8.9–10.3)
CHLORIDE: 93 mmol/L — AB (ref 101–111)
CO2: 23 mmol/L (ref 22–32)
CREATININE: 0.58 mg/dL (ref 0.44–1.00)
GFR calc Af Amer: >60 mL/min (ref >60)
GFR calc non Af Amer: >60 mL/min (ref >60)
Glucose, Bld: 107 mg/dL — ABNORMAL HIGH (ref 65–99)
Potassium: 4.4 mmol/L (ref 3.5–5.1)
SODIUM: 123 mmol/L — AB (ref 135–145)

## 2015-05-10 LAB — CBC
HCT: 36.8 % (ref 36.0–46.0)
Hemoglobin: 12.3 g/dL (ref 12.0–15.0)
MCH: 26.9 pg (ref 26.0–34.0)
MCHC: 33.4 g/dL (ref 30.0–36.0)
MCV: 80.3 fL (ref 78.0–100.0)
PLATELETS: 429 10*3/uL — AB (ref 150–400)
RBC: 4.58 MIL/uL (ref 3.87–5.11)
RDW: 13.4 % (ref 11.5–15.5)
WBC: 11 10*3/uL — AB (ref 4.0–10.5)

## 2015-05-10 LAB — T4, FREE: Free T4: 1.38 ng/dL — ABNORMAL HIGH (ref 0.61–1.12)

## 2015-05-10 MED ORDER — FUROSEMIDE 10 MG/ML IJ SOLN
10.0000 mg | Freq: Once | INTRAMUSCULAR | Status: AC
  Administered 2015-05-10: 10 mg via INTRAVENOUS
  Filled 2015-05-10: qty 2

## 2015-05-10 MED ORDER — BOOST / RESOURCE BREEZE PO LIQD
1.0000 | Freq: Three times a day (TID) | ORAL | Status: DC
  Administered 2015-05-10 – 2015-05-12 (×5): 1 via ORAL

## 2015-05-10 NOTE — Progress Notes (Signed)
Physical Therapy Treatment Patient Details Name: Cathy Silva MRN: [id number] DOB: [birth year] Today's Date: 05/10/2015    History of Present Illness Pt admitted with back pain. Acute onset. Started 5 days ago this patient was getting into her bed. Mid back without radiation. Denies any loss of function in her lower extremities or loss of bowel or bladder function. Fairly constant with waxing and waning nature. Worse with certain movements. Denies any dysuria or frequency. Denies fevers. Associate with nausea. Diarrhea 1 one day ago. Vomiting 1 in ED that was nonbilious non-bloody. 200 mg of ibuprofen daily without improvement. 400 mg of ibuprofen 1 on day of admission with some improvement. MRI revealed T7, L1 & L2 compression fractures.    PT Comments    Progressing and met LTG for ambulation.  Still feels weak and walker needed for balance/safety.  Agree with home with HHPT. More c/o pain from swallowing than from back.  Follow Up Recommendations  Home health PT;Supervision/Assistance - 24 hour     Equipment Recommendations  Rolling walker with 5" wheels    Recommendations for Other Services       Precautions / Restrictions Precautions Precautions: Fall (back prec for comfort)    Mobility  Bed Mobility               General bed mobility comments: up in chair with tech help  Transfers   Equipment used: Rolling walker (2 wheeled) Transfers: Sit to/from Stand Sit to Stand: Supervision         General transfer comment: for safety, use of UE support on armrests  Ambulation/Gait Ambulation/Gait assistance: Supervision Ambulation Distance (Feet): 400 Feet Assistive device: Rolling walker (2 wheeled) Gait Pattern/deviations: Decreased stride length;Step-through pattern     General Gait Details: slower for safety on turns, demonstrated good technique with RW   Stairs            Wheelchair Mobility    Modified Rankin (Stroke Patients Only)       Balance Overall balance assessment: Needs assistance           Standing balance-Leahy Scale: Fair Standing balance comment: able to walk short distance in room without walker, with supervision and flexed posture                    Cognition Arousal/Alertness: Awake/alert Behavior During Therapy: WFL for tasks assessed/performed Overall Cognitive Status: Within Functional Limits for tasks assessed                      Exercises General Exercises - Upper Extremity Shoulder Flexion: Strengthening;Both;10 reps;Seated (with lotion bottles for small weight) Elbow Flexion: Strengthening;Both;10 reps;Seated (with lotion bottles for small weight) General Exercises - Lower Extremity Long Arc Quad: AROM;Both;10 reps;Seated Hip Flexion/Marching: AROM;Both;10 reps;Seated Toe Raises: AROM;Both;10 reps;Seated Heel Raises: AROM;Both;10 reps;Seated    General Comments        Pertinent Vitals/Pain Faces Pain Scale: Hurts little more Pain Location: chest (esophogus) with drinking/eating Pain Descriptors / Indicators: Aching;Grimacing Pain Intervention(s): Monitored during session;Other (comment) (encouraged upright/SLP recs)    Home Living                      Prior Function            PT Goals (current goals can now be found in the care plan section) Progress towards PT goals: Progressing toward goals    Frequency  Min 3X/week    PT Plan Current plan remains  appropriate    Co-evaluation             End of Session Equipment Utilized During Treatment: Gait belt Activity Tolerance: Patient tolerated treatment well Patient left: in chair;with call bell/phone within reach;with chair alarm set     Time: 8850-2774 PT Time Calculation (min) (ACUTE ONLY): 29 min  Charges:  $Gait Training: 8-22 mins $Therapeutic Exercise: 8-22 mins                    G Codes:      Reginia Naas 13-May-2015, 4:03 PM  Magda Kiel, [place] 2015-05-13

## 2015-05-10 NOTE — Progress Notes (Signed)
   05/08/15 1617  OT G-codes **NOT FOR INPATIENT CLASS**  Functional Assessment Tool Used clinical observation  Functional Limitation Self care  Self Care Current Status [id number]) CI  Self Care Goal Status [id number]) CI   late entry for 05/07/[address], OTR/L [id number] 05/10/2015

## 2015-05-10 NOTE — Progress Notes (Signed)
Speech Language Pathology Treatment: Dysphagia  Patient Details Name: Cathy Silva MRN: [id number] DOB: [birth year] Today's Date: 05/10/2015 Time: [id number] SLP Time Calculation (min) (ACUTE ONLY): 11 min  Assessment / Plan / Recommendation Clinical Impression  Pt grimacing as SLP arrived to room due to globus sensation while consuming applesauce with crushed meds. Verbal cues to follow bites with thin liquids with minimal to no relief. Increased time between bites assists in transit and relief. SLP inquired re: MD recommendations who reported that should have relief when sodium stabalizes; possible endoscopy in the future if needed. Reinterated swallow precautions with pt and recommend continue regular texture and thin liquids. Discharge ST.   HPI HPI: Pt admit with back pain. Acute onset. Started 5 days ago this patient was getting into her bed. Mid back without radiation. Denies any loss of function in her lower extremities or loss of bowel or bladder function. Fairly constant with waxing and waning nature. Worse with certain movements. Denies any dysuria or frequency. Denies fevers. Associate with nausea. Diarrhea 1 one day ago. Vomiting 1 in ED that was nonbilious non-bloody. 200 mg of ibuprofen daily without improvement. 400 mg of ibuprofen 1 on day of admission with some improvement.Pt has been reporting difficulty swallowing with reduced PO intake. She says that in the past she has been told that she has a hiatal hernia.      SLP Plan  Discharge SLP treatment due to (comment)     Recommendations  Diet recommendations: Regular;Thin liquid Liquids provided via: Cup;No straw Medication Administration: Crushed with puree Supervision: Patient able to self feed Compensations: Slow rate;Small sips/bites;Follow solids with liquid Postural Changes and/or Swallow Maneuvers: Seated upright 90 degrees;Upright 30-60 min after meal              Oral Care Recommendations: Oral care  BID Follow up Recommendations: None Plan: Discharge SLP treatment due to (comment)   [place] Siren 05/10/2015, 10:14 AM  Orbie Pyo Colvin Caroli.Ed [employer] [phone]

## 2015-05-10 NOTE — Progress Notes (Signed)
   UNASSIGNED PATIENT Subjective: Patient seems worried about "not being able to eat" for the last 2 days. Claims she was fine till 2 days ago when she developed dysphagia. Severe dysmotility noted on esophagogram.   Objective: Vital signs in last 24 hours: Temp:  [97.8 F (36.6 C)-98.3 F (36.8 C)] 97.8 F (36.6 C) (01/11 1230) Pulse Rate:  [70-93] 93 (01/11 1230) Resp:  [20] 20 (01/11 1230) BP: (149-169)/(67-84) 149/84 mmHg (01/11 1230) SpO2:  [95 %-97 %] 96 % (01/11 1230) Weight:  [73.347 kg (161 lb 11.2 oz)] 73.347 kg (161 lb 11.2 oz) (01/11 0300) Last BM Date: 05/07/15  Intake/Output from previous day: 01/10 0701 - 01/11 0700 In: 1300 [P.O.:1300] Out: 1200 [Urine:1200] Intake/Output this shift:   General appearance: alert, fatigued, no distress and pale Resp: clear to auscultation bilaterally Cardio: regular rate and rhythm, S1, S2 normal, no murmur, click, rub or gallop GI: soft, non-tender; bowel sounds normal; no masses,  no organomegaly  Lab Results:  Recent Labs  05/08/15 0534 05/09/15 0538 05/10/15 0415  WBC 11.3* 12.4* 11.0*  HGB 12.0 12.6 12.3  HCT 35.3* 36.1 36.8  PLT 384 415* 429*   BMET  Recent Labs  05/09/15 0538 05/09/15 1545 05/10/15 0415  NA 120* 121* 123*  K 3.4* 4.0 4.4  CL 93* 90* 93*  CO2 20* 21* 23  GLUCOSE 109* 124* 107*  BUN 6 6 5*  CREATININE 0.58 0.63 0.58  CALCIUM 8.0* 8.9 8.7*   Studies/Results: Dg Esophagus  05/09/2015  CLINICAL DATA:  Frequent eructation with liquid intake, globus sensation, and reported feeling of food getting "stuck" in her chest. She does have delayed coughing throughout intake, although vocal quality remains clear. Suspect that this is related to possible esophageal issues, although also cannot rule out backflow into the pharynx / larynx post-swallow EXAM: ESOPHOGRAM/BARIUM SWALLOW TECHNIQUE: Single contrast examination was performed using  thin barium. FLUOROSCOPY TIME:  Radiation Exposure Index (as  provided by the fluoroscopic device): If the device does not provide the exposure index: Fluoroscopy Time:  36 secs Number of Acquired Images:  3 COMPARISON:  CT chest 05/07/2015 FINDINGS: The first swallow of barium passed quickly into the stomach. Subsequent sips remain static within the esophagus. There was to and fro motion of the barium bolus into the high thoracic esophagus. There was little peristaltic wave action and no tertiary contractions. No retained food stuff within the esophagus. No esophageal dilatation. IMPRESSION: 1. Significant esophageal dysmotility with stasis of the barium bolus within the esophagus and to and fro motion in the high cervical esophagus. No tertiary contractions. 2. No evidence obstruction or retained food stuff. Electronically Signed   By: Suzy Bouchard M.D.   On: 05/09/2015 09:19   Medications: I have reviewed the patient's current medications.  Assessment/Plan: Dysphagia: Will plan to do an EGD tomorrow.    Joden Bonsall 05/10/2015, 1:20 PM

## 2015-05-10 NOTE — Progress Notes (Signed)
[facility]                                                                              Patient Demographics  Cathy Silva, is a 82 y.o. female, DOB - [birth year], [id number]  Admit date - 05/07/2015   Admitting Physician Waldemar Dickens, MD  Outpatient Primary MD for the patient is [name], MD  LOS -    Chief Complaint  Patient presents with  . Back Pain      HPI on 05/07/2015 by Dr. Linna Darner Cathy Silva is a 82 y.o. female  Back pain. Acute onset. Started 5 days ago this patient was getting into her bed. Mid back without radiation. Denies any loss of function in her lower extremities or loss of bowel or bladder function. Fairly constant with waxing and waning nature. Worse with certain movements. Denies any dysuria or frequency. Denies fevers. Associate with nausea. Diarrhea 1 one day ago. Vomiting 1 in ED that was nonbilious non-bloody. 200 mg of ibuprofen daily without improvement. 400 mg of ibuprofen 1 on day of admission with some improvement.  Assessment & Plan   Hyponatremia -Was 121 on admission, improved to 126 however once placed on IV fluid, was down to 120 -Serum osmolarity 251, urine osmolarity 302, urine sodium 41 -TSH 4.823, will obtain FT4 -Placed on fluid restriction -Possibly SIADH -Will give small dose of lasix to see if there is any improvement in sodium -Will call PCP to see if there is any old lab work available.  Nausea, vomiting, diarrhea -Likely viral gastroenteritis -Appears to have improved -CT abdomen and pelvis negative for any acute abdominal pathology  Dysphagia -Speech therapy consulted and appreciated -Swallow study showed esophageal dysphagia -Esophagogram showed severe dysmotility -Gastroenterology consulted and appreciated, Dr. Benson Norway- recommended correcting hyponatremia, if no change dysphagia with normalization of sodium, EGD will be pursued, continue PPI  Thoracic back pain -Likely musculoskeletal, no  radiation -Improved -Thoracic spine x-ray showed T7 compression fracture -Physical therapy consulted and appreciated, recommended home health -Continue pain control  Hypertension -Patient has not history of hypertension, likely secondary to acute illness and stress -Continue amlodipine, hydralazine as needed  Pulmonary nodule -Seen incidentally on CT chest -Repeat CT chest in 6-12 months outpatient  Code Status: Full  Family Communication: None at bedside  Disposition Plan: Admitted. Pending improvement in sodium and swallowing  Time Spent in minutes   30 minutes  Procedures  CT angiogram chest, abdomen Esophagogram  Consults   Gastroenterology, Dr. Benson Norway  DVT Prophylaxis  Lovenox  Lab Results  Component Value Date   PLT 429* 05/10/2015    Medications  Scheduled Meds: . acidophilus  2 capsule Oral Daily  . amLODipine  5 mg Oral Daily  . Besifloxacin HCl  1 drop Left Eye TID WC  . [START ON 05/20/2015] Difluprednate  1 drop Left Eye Daily   Followed by  . Difluprednate  1 drop Left Eye TID   Followed by  . [START ON 05/13/2015] Difluprednate  1 drop Left Eye BID  . enoxaparin (LOVENOX) injection  40 mg Subcutaneous Q24H  . feeding supplement  1 Container Oral TID BM  . lidocaine  1 patch Transdermal Q24H  .  nepafenac  1 drop Left Eye QHS  . nepafenac  1 drop Right Eye QHS  . pantoprazole  40 mg Oral Q0600  . potassium chloride  40 mEq Oral Daily  . sodium chloride  3 mL Intravenous Q12H   Continuous Infusions:  PRN Meds:.acetaminophen **OR** acetaminophen, hydrALAZINE, methocarbamol, ondansetron **OR** ondansetron (ZOFRAN) IV, traMADol  Antibiotics    Anti-infectives    None      Subjective:   Cathy Silva seen and examined today.  Patient states that just hard to swallow, would like some boost. Denies any chest pain or shortness of breath, current nausea or vomiting or diarrhea. Patient states she usually takes for herself at home and has been  drinking water.    Objective:   Filed Vitals:   05/09/15 1256 05/09/15 2022 05/10/15 0300 05/10/15 0550  BP: 174/80 169/67  164/73  Pulse: 77 89  70  Temp: 98 F (36.7 C) 98.3 F (36.8 C)  98.2 F (36.8 C)  TempSrc: Oral Oral  Oral  Resp: 20 20  20   Height:      Weight:   73.347 kg (161 lb 11.2 oz)   SpO2: 98% 95%  97%    Wt Readings from Last 3 Encounters:  05/10/15 73.347 kg (161 lb 11.2 oz)  07/20/14 75.297 kg (166 lb)  07/12/13 75.751 kg (167 lb)     Intake/Output Summary (Last 24 hours) at 05/10/15 1208 Last data filed at 05/10/15 0557  Gross per 24 hour  Intake    960 ml  Output   1200 ml  Net   -240 ml    Exam  General: Well developed, well nourished, NAD, appears stated age  HEENT: NCAT,  mucous membranes moist.   Cardiovascular: S1 S2 auscultated, no rubs, murmurs or gallops. Regular rate and rhythm.  Respiratory: Clear to auscultation bilaterally with equal chest rise  Abdomen: Soft, nontender, nondistended, + bowel sounds  Extremities: warm dry without cyanosis clubbing or edema  Neuro: AAOx3, nonfocal  Psych: Normal affect and demeanor  Data Review   Micro Results No results found for this or any previous visit (from the past 240 hour(s)).  Radiology Reports Dg Chest 2 View  05/07/2015  CLINICAL DATA:  Mid back pain since Wednesday. New dizziness was shortness of breath and some nausea last night. EXAM: CHEST  2 VIEW COMPARISON:  Chest CT performed earlier same day. FINDINGS: Heart size is upper normal. Overall cardiomediastinal silhouette is within normal limits in size and configuration, with associated age-related ectasia of the thoracic aorta. Mild scarring versus atelectasis at each lung base, better seen on earlier chest CT. Lungs otherwise clear. No confluent opacity to suggest a developing pneumonia. No pleural effusions seen. No pneumothorax seen. There is a compression fracture deformity of a mid thoracic vertebral body, labeled T7 on  earlier CT, of uncertain age but most likely chronic. There is also a mild dextroscoliosis of the lower thoracic spine. Mild degenerative change noted within the thoracic spine. No acute-appearing osseous abnormality. IMPRESSION: Mild scarring versus atelectasis at each lung base, better seen on earlier chest CT. Lungs are otherwise clear and there is no evidence of acute cardiopulmonary abnormality. Compression fracture deformity of the T7 vertebral body, of uncertain age, but most likely old, also better seen on earlier CT. Electronically Signed   By: Franki Cabot M.D.   On: 05/07/2015 12:24   Dg Thoracic Spine 2 View  05/08/2015  CLINICAL DATA:  Sudden onset of back pain while getting into  bed. EXAM: THORACIC SPINE 2 VIEWS COMPARISON:  Chest radiograph and chest CT, 05/07/2015 FINDINGS: Moderate compression fracture of T7, unchanged from the previous day's exams. No other thoracic fractures. Fractures of L1 and L2 are can, also stable from the prior CT. No bone lesion. Bones are demineralized. Minor degenerative endplate spurring is noted along the mid and lower thoracic spine Soft tissues are unremarkable. IMPRESSION: 1. Moderate compression fracture of T7, with compression fractures also noted at of L1 and L2, unchanged from the previous day's CT scan. 2. No other fractures.  No lesions.  Minor degenerative changes. Electronically Signed   By: Lajean Manes M.D.   On: 05/08/2015 12:56   Ct Angio Abdomen W/cm &/or Wo Contrast  05/07/2015  CLINICAL DATA:  82 year old female with mid back pain, hypertension and nausea/vomiting EXAM: CT ANGIOGRAPHY CHEST, ABDOMEN AND PELVIS TECHNIQUE: Multidetector CT imaging through the chest, abdomen and pelvis was performed using the standard protocol during bolus administration of intravenous contrast. Multiplanar reconstructed images and MIPs were obtained and reviewed to evaluate the vascular anatomy. CONTRAST:  75mL OMNIPAQUE IOHEXOL 350 MG/ML SOLN COMPARISON:  Prior  MRI lumbar spine 04/18/2012 FINDINGS: CTA CHEST FINDINGS Mediastinum: Unremarkable CT appearance of the thyroid gland. No suspicious mediastinal or hilar adenopathy. No soft tissue mediastinal mass. The thoracic esophagus is unremarkable. Heart/Vascular: No evidence of acute intramural hematoma on the initial non contrasted images. Following the administration of intravenous contrast. There is a conventional 3 vessel arch morphology. No evidence of aneurysm or acute dissection. Mild atherosclerotic vascular calcifications without significant stenosis. Calcifications present along the course of the left anterior descending, circumflex and right coronary arteries. Concentric hypertrophy of the left ventricle. The heart is at the upper limits of normal for size. No pericardial effusion. Normal caliber main and central pulmonary arteries. No evidence of acute pulmonary embolus. Lungs/Pleura: 5-6 mm nodule in the superior aspect of the right middle lobe (image 27 series 5). Adjacent 3 mm ground-glass attenuation nodule on the same image. 4 mm nodule slightly more inferior on image 31. Dependent atelectasis in both lower lobes. 3 mm ground-glass attenuation nodular opacity in the left lung apex on image 7 of series 5. Probable 5 mm pulmonary nodule in the left lower lobe (image 45 series 5). No evidence of focal airspace consolidation, pulmonary edema, pleural effusion or pneumothorax. Bones/Soft Tissues: No acute fracture or aggressive appearing lytic or blastic osseous lesion. T7 compression fracture with approximately 50% height loss anteriorly. Review of the MIP images confirms the above findings. CTA ABDOMEN AND PELVIS FINDINGS VASCULAR Aorta: Tortuous but normal caliber abdominal aorta. No evidence of acute dissection. Scattered calcified atherosclerotic plaque. No significant or irregular mural thrombus. Celiac: Variant anatomy. The splenic and left gastric artery arise from a common trunk. The proper hepatic  artery arises independently from the aorta. Atherosclerotic plaque at the origin of both vessels results in at least mild narrowing. No visceral artery aneurysm. SMA: Widely patent and unremarkable. Renals: Single dominant renal arteries bilaterally which are widely patent without evidence of fibromuscular dysplasia. Accessory renal artery to the lower pole of the left kidney. IMA: Atherosclerotic plaque results in at least mild narrowing of the proximal vessel. The distal vessel remains patent. Inflow: Tortuous and mildly ectatic but no aneurysm, dissection or high-grade stenosis. Proximal Outflow: Visualized portions are relatively disease free. Veins: No focal venous abnormality. NON-VASCULAR Abdomen: Moderate respiratory motion artifact in the upper abdomen. Unremarkable CT appearance of the stomach, duodenum, spleen, adrenal glands and pancreas. Normal hepatic contour  and morphology. No discrete hepatic lesion. Gallbladder is unremarkable. No intra or extrahepatic biliary ductal dilatation. Unremarkable appearance of the bilateral kidneys. No focal solid lesion, hydronephrosis or nephrolithiasis. Colonic diverticular disease without CT evidence of active inflammation. The colon is decompressed. No focal bowel wall thickening or evidence of obstruction. No free fluid or suspicious adenopathy. Pelvis: The uterus is surgically absent. The bladder is dilated with urine. No free fluid or suspicious adenopathy. Bones/Soft Tissues: The bones appear diffusely demineralized. There is no evidence of acute fracture, malalignment or aggressive lytic or blastic osseous lesion. Advanced multilevel degenerative disc disease. Lower lumbar facet arthropathy. L1 and L2 compression fractures without significant interval change compared to prior MRI. Review of the MIP images confirms the above findings. IMPRESSION: CTA CHEST 1. No acute dissection, aneurysm or other evidence of acute aortic syndrome. 2. Atherosclerotic vascular  calcifications including 3 vessel coronary artery calcifications. 3. Concentric hypertrophy of the left ventricle. Does the patient have a clinical history of systemic arterial hypertension? 4. Multiple pulmonary nodules measuring up to 5 mm. While these are highly likely (in the absence of a known primary malignancy. If the patient has a known primary malignancy then metastatic disease would be more likely) the sequelae of a prior infectious/inflammatory or granulomatous process, stability cannot be confirmed without prior imaging for comparison. Consider follow-up CT scan of the chest in 6-12 months if the documentation of stability is clinically warranted. 5. Age indeterminate (without prior imaging for comparison) but likely chronic T7 compression fracture. CTA ABD/PELVIS 1. Moderate respiratory/patient motion artifact. 2. No evidence of acute aortic dissection, aneurysm or other acute vascular abnormality. 3. Incidental note is made of variant anatomy of the celiac artery origin. 4. Atherosclerotic vascular calcifications without significant stenosis to suggest a source for chronic mesenteric ischemia. 5. Colonic diverticular disease without CT evidence of active inflammation. 6. Chronic L1 and L2 compression fractures without evidence of progressive height loss compared to 04/18/2012. Signed, Criselda Peaches, MD [facility] Radiology Electronically Signed   By: Jacqulynn Cadet M.D.   On: 05/07/2015 11:40   Dg Esophagus  05/09/2015  CLINICAL DATA:  Frequent eructation with liquid intake, globus sensation, and reported feeling of food getting "stuck" in her chest. She does have delayed coughing throughout intake, although vocal quality remains clear. Suspect that this is related to possible esophageal issues, although also cannot rule out backflow into the pharynx / larynx post-swallow EXAM: ESOPHOGRAM/BARIUM SWALLOW TECHNIQUE: Single contrast examination  was performed using  thin barium. FLUOROSCOPY TIME:  Radiation Exposure Index (as provided by the fluoroscopic device): If the device does not provide the exposure index: Fluoroscopy Time:  36 secs Number of Acquired Images:  3 COMPARISON:  CT chest 05/07/2015 FINDINGS: The first swallow of barium passed quickly into the stomach. Subsequent sips remain static within the esophagus. There was to and fro motion of the barium bolus into the high thoracic esophagus. There was little peristaltic wave action and no tertiary contractions. No retained food stuff within the esophagus. No esophageal dilatation. IMPRESSION: 1. Significant esophageal dysmotility with stasis of the barium bolus within the esophagus and to and fro motion in the high cervical esophagus. No tertiary contractions. 2. No evidence obstruction or retained food stuff. Electronically Signed   By: Suzy Bouchard M.D.   On: 05/09/2015 09:19   Ct Angio Chest Aorta W/cm &/or Wo/cm  05/07/2015  CLINICAL DATA:  82 year old female with mid back pain, hypertension and nausea/vomiting EXAM: CT ANGIOGRAPHY CHEST,  ABDOMEN AND PELVIS TECHNIQUE: Multidetector CT imaging through the chest, abdomen and pelvis was performed using the standard protocol during bolus administration of intravenous contrast. Multiplanar reconstructed images and MIPs were obtained and reviewed to evaluate the vascular anatomy. CONTRAST:  33mL OMNIPAQUE IOHEXOL 350 MG/ML SOLN COMPARISON:  Prior MRI lumbar spine 04/18/2012 FINDINGS: CTA CHEST FINDINGS Mediastinum: Unremarkable CT appearance of the thyroid gland. No suspicious mediastinal or hilar adenopathy. No soft tissue mediastinal mass. The thoracic esophagus is unremarkable. Heart/Vascular: No evidence of acute intramural hematoma on the initial non contrasted images. Following the administration of intravenous contrast. There is a conventional 3 vessel arch morphology. No evidence of aneurysm or acute dissection. Mild atherosclerotic  vascular calcifications without significant stenosis. Calcifications present along the course of the left anterior descending, circumflex and right coronary arteries. Concentric hypertrophy of the left ventricle. The heart is at the upper limits of normal for size. No pericardial effusion. Normal caliber main and central pulmonary arteries. No evidence of acute pulmonary embolus. Lungs/Pleura: 5-6 mm nodule in the superior aspect of the right middle lobe (image 27 series 5). Adjacent 3 mm ground-glass attenuation nodule on the same image. 4 mm nodule slightly more inferior on image 31. Dependent atelectasis in both lower lobes. 3 mm ground-glass attenuation nodular opacity in the left lung apex on image 7 of series 5. Probable 5 mm pulmonary nodule in the left lower lobe (image 45 series 5). No evidence of focal airspace consolidation, pulmonary edema, pleural effusion or pneumothorax. Bones/Soft Tissues: No acute fracture or aggressive appearing lytic or blastic osseous lesion. T7 compression fracture with approximately 50% height loss anteriorly. Review of the MIP images confirms the above findings. CTA ABDOMEN AND PELVIS FINDINGS VASCULAR Aorta: Tortuous but normal caliber abdominal aorta. No evidence of acute dissection. Scattered calcified atherosclerotic plaque. No significant or irregular mural thrombus. Celiac: Variant anatomy. The splenic and left gastric artery arise from a common trunk. The proper hepatic artery arises independently from the aorta. Atherosclerotic plaque at the origin of both vessels results in at least mild narrowing. No visceral artery aneurysm. SMA: Widely patent and unremarkable. Renals: Single dominant renal arteries bilaterally which are widely patent without evidence of fibromuscular dysplasia. Accessory renal artery to the lower pole of the left kidney. IMA: Atherosclerotic plaque results in at least mild narrowing of the proximal vessel. The distal vessel remains patent. Inflow:  Tortuous and mildly ectatic but no aneurysm, dissection or high-grade stenosis. Proximal Outflow: Visualized portions are relatively disease free. Veins: No focal venous abnormality. NON-VASCULAR Abdomen: Moderate respiratory motion artifact in the upper abdomen. Unremarkable CT appearance of the stomach, duodenum, spleen, adrenal glands and pancreas. Normal hepatic contour and morphology. No discrete hepatic lesion. Gallbladder is unremarkable. No intra or extrahepatic biliary ductal dilatation. Unremarkable appearance of the bilateral kidneys. No focal solid lesion, hydronephrosis or nephrolithiasis. Colonic diverticular disease without CT evidence of active inflammation. The colon is decompressed. No focal bowel wall thickening or evidence of obstruction. No free fluid or suspicious adenopathy. Pelvis: The uterus is surgically absent. The bladder is dilated with urine. No free fluid or suspicious adenopathy. Bones/Soft Tissues: The bones appear diffusely demineralized. There is no evidence of acute fracture, malalignment or aggressive lytic or blastic osseous lesion. Advanced multilevel degenerative disc disease. Lower lumbar facet arthropathy. L1 and L2 compression fractures without significant interval change compared to prior MRI. Review of the MIP images confirms the above findings. IMPRESSION: CTA CHEST 1. No acute dissection, aneurysm or other evidence of acute aortic syndrome.  2. Atherosclerotic vascular calcifications including 3 vessel coronary artery calcifications. 3. Concentric hypertrophy of the left ventricle. Does the patient have a clinical history of systemic arterial hypertension? 4. Multiple pulmonary nodules measuring up to 5 mm. While these are highly likely (in the absence of a known primary malignancy. If the patient has a known primary malignancy then metastatic disease would be more likely) the sequelae of a prior infectious/inflammatory or granulomatous process, stability cannot be  confirmed without prior imaging for comparison. Consider follow-up CT scan of the chest in 6-12 months if the documentation of stability is clinically warranted. 5. Age indeterminate (without prior imaging for comparison) but likely chronic T7 compression fracture. CTA ABD/PELVIS 1. Moderate respiratory/patient motion artifact. 2. No evidence of acute aortic dissection, aneurysm or other acute vascular abnormality. 3. Incidental note is made of variant anatomy of the celiac artery origin. 4. Atherosclerotic vascular calcifications without significant stenosis to suggest a source for chronic mesenteric ischemia. 5. Colonic diverticular disease without CT evidence of active inflammation. 6. Chronic L1 and L2 compression fractures without evidence of progressive height loss compared to 04/18/2012. Signed, Criselda Peaches, MD [facility] Radiology Electronically Signed   By: Jacqulynn Cadet M.D.   On: 05/07/2015 11:40    CBC  Recent Labs Lab 05/07/15 0945 05/07/15 1004 05/08/15 0534 05/09/15 0538 05/10/15 0415  WBC 11.8*  --  11.3* 12.4* 11.0*  HGB 12.9 14.6 12.0 12.6 12.3  HCT 37.7 43.0 35.3* 36.1 36.8  PLT 379  --  384 415* 429*  MCV 80.2  --  80.8 79.7 80.3  MCH 27.4  --  27.5 27.8 26.9  MCHC 34.2  --  34.0 34.9 33.4  RDW 13.1  --  13.2 13.4 13.4    Chemistries   Recent Labs Lab 05/07/15 0945  05/07/15 2206 05/08/15 0534 05/09/15 0538 05/09/15 1545 05/10/15 0415  NA 121*  < > 124* 126* 120* 121* 123*  K 3.6  < > 3.6 3.2* 3.4* 4.0 4.4  CL 88*  < > 93* 93* 93* 90* 93*  CO2 19*  --  20* 24 20* 21* 23  GLUCOSE 124*  < > 117* 118* 109* 124* 107*  BUN 7  < > 7 <5* 6 6 5*  CREATININE 0.52  < > 0.61 0.58 0.58 0.63 0.58  CALCIUM 8.7*  --  8.4* 8.3* 8.0* 8.9 8.7*  AST 21  --   --   --   --   --   --   ALT 15  --   --   --   --   --   --   ALKPHOS 71  --   --   --   --   --   --   BILITOT 0.9  --   --   --   --   --   --   < > =  values in this interval not displayed. ------------------------------------------------------------------------------------------------------------------ estimated creatinine clearance is 54.6 mL/min (by C-G formula based on Cr of 0.58). ------------------------------------------------------------------------------------------------------------------ No results for input(s): HGBA1C in the last 72 hours. ------------------------------------------------------------------------------------------------------------------ No results for input(s): CHOL, HDL, LDLCALC, TRIG, CHOLHDL, LDLDIRECT in the last 72 hours. ------------------------------------------------------------------------------------------------------------------  Recent Labs  05/09/15 1545  TSH 4.823*   ------------------------------------------------------------------------------------------------------------------ No results for input(s): VITAMINB12, FOLATE, FERRITIN, TIBC, IRON, RETICCTPCT in the last 72 hours.  Coagulation profile No results for input(s): INR, PROTIME in the last 168 hours.  No results for input(s): DDIMER in the last  72 hours.  Cardiac Enzymes No results for input(s): CKMB, TROPONINI, MYOGLOBIN in the last 168 hours.  Invalid input(s): CK ------------------------------------------------------------------------------------------------------------------ Invalid input(s): POCBNP    Cathy Silva D.O. on 05/10/2015 at 12:08 PM  Between 7am to 7pm - Pager - [phone]  After 7pm go to [email or web address] - password [email or web address]  And look for the night coverage person covering for me after hours  [facility] Office  [phone]

## 2015-05-10 NOTE — Care Management Note (Signed)
Case Management Note  Patient Details  Name: ABCDE SPEZIA MRN: [id number] Date of Birth: [birth year]  Subjective/Objective:        Admitted with Hyponatremia            Action/Plan: Patient lives alone, supportive niece that checks on her. Patient states that she remains active in the community, drives, continue to cook and clean her home. She is also thinking about joining the [employer]. Patient has Medicare and [employer] for prescription drug coverage; pharmacy of choice is [facility]. Patient states that she does not have any problem getting her medication. No DME used at this time. Patient could benefit from [facility] services at this time. Patient wants to wait until she talks to her niece about which [facility] agency to use. CM to follow up with patient in the am.  Expected Discharge Date:   possibly 05/11/2015               Expected Discharge Plan:  [place]   Discharge planning Services  CM Consult  Choice offered to:  Patient  HH Arranged:  RN, PT [facility] Agency:   undecided at this time  Status of Service:  In process, will continue to follow  Sherrilyn Rist [phone] 05/10/2015, 3:16 PM

## 2015-05-11 ENCOUNTER — Encounter (HOSPITAL_COMMUNITY)
Admission: EM | Disposition: A | Discharge status: Home / Self Care | Payer: Self-pay | Source: Home / Self Care | Attending: Internal Medicine

## 2015-05-11 ENCOUNTER — Encounter (HOSPITAL_COMMUNITY): Payer: Self-pay | Admitting: *Deleted

## 2015-05-11 HISTORY — PX: BALLOON DILATION: SHX5330

## 2015-05-11 HISTORY — PX: ESOPHAGOGASTRODUODENOSCOPY: SHX5428

## 2015-05-11 LAB — BASIC METABOLIC PANEL
ANION GAP: 6 (ref 5–15)
BUN: 8 mg/dL (ref 6–20)
CHLORIDE: 99 mmol/L — AB (ref 101–111)
CO2: 23 mmol/L (ref 22–32)
Calcium: 8.7 mg/dL — ABNORMAL LOW (ref 8.9–10.3)
Creatinine, Ser: 0.55 mg/dL (ref 0.44–1.00)
GFR calc Af Amer: >60 mL/min (ref >60)
Glucose, Bld: 104 mg/dL — ABNORMAL HIGH (ref 65–99)
POTASSIUM: 3.9 mmol/L (ref 3.5–5.1)
SODIUM: 128 mmol/L — AB (ref 135–145)

## 2015-05-11 LAB — CBC
HEMATOCRIT: 34.8 % — AB (ref 36.0–46.0)
HEMOGLOBIN: 11.8 g/dL — AB (ref 12.0–15.0)
MCH: 27 pg (ref 26.0–34.0)
MCHC: 33.9 g/dL (ref 30.0–36.0)
MCV: 79.6 fL (ref 78.0–100.0)
Platelets: 416 10*3/uL — ABNORMAL HIGH (ref 150–400)
RBC: 4.37 MIL/uL (ref 3.87–5.11)
RDW: 13.4 % (ref 11.5–15.5)
WBC: 9.3 10*3/uL (ref 4.0–10.5)

## 2015-05-11 SURGERY — EGD (ESOPHAGOGASTRODUODENOSCOPY)
Anesthesia: Moderate Sedation

## 2015-05-11 MED ORDER — MIDAZOLAM HCL 10 MG/2ML IJ SOLN
INTRAMUSCULAR | Status: DC | PRN
  Administered 2015-05-11 (×2): 2 mg via INTRAVENOUS

## 2015-05-11 MED ORDER — MIDAZOLAM HCL 5 MG/ML IJ SOLN
INTRAMUSCULAR | Status: AC
Start: 2015-05-11 — End: 2015-05-11
  Filled 2015-05-11: qty 2

## 2015-05-11 MED ORDER — FENTANYL CITRATE (PF) 100 MCG/2ML IJ SOLN
INTRAMUSCULAR | Status: AC
  Filled 2015-05-11: qty 2

## 2015-05-11 MED ORDER — FUROSEMIDE 10 MG/ML IJ SOLN
10.0000 mg | Freq: Once | INTRAMUSCULAR | Status: AC
  Administered 2015-05-11: 10 mg via INTRAVENOUS
  Filled 2015-05-11: qty 2

## 2015-05-11 MED ORDER — DIPHENHYDRAMINE HCL 50 MG/ML IJ SOLN
INTRAMUSCULAR | Status: AC
  Filled 2015-05-11: qty 1

## 2015-05-11 MED ORDER — FENTANYL CITRATE (PF) 100 MCG/2ML IJ SOLN
INTRAMUSCULAR | Status: DC | PRN
  Administered 2015-05-11: 25 ug via INTRAVENOUS

## 2015-05-11 NOTE — Interval H&P Note (Signed)
History and Physical Interval Note:  05/11/2015 2:11 PM  Cathy Silva  has presented today for surgery, with the diagnosis of Dysphagia  The various methods of treatment have been discussed with the patient and family. After consideration of risks, benefits and other options for treatment, the patient has consented to  Procedure(s): ESOPHAGOGASTRODUODENOSCOPY (EGD) (Left) BALLOON DILATION (N/A) as a surgical intervention .  The patient's history has been reviewed, patient examined, no change in status, stable for surgery.  I have reviewed the patient's chart and labs.  Questions were answered to the patient's satisfaction.     Ramisa Duman D

## 2015-05-11 NOTE — Progress Notes (Signed)
[facility]                                                                              Patient Demographics  Cathy Silva, is a 82 y.o. female, DOB - 12/04/1929, [id number]  Admit date - 05/07/2015   Admitting Physician Waldemar Dickens, MD  Outpatient Primary MD for the patient is [name], MD  LOS - 1   Chief Complaint  Patient presents with  . Back Pain      HPI on 05/07/2015 by Dr. Linna Darner Cathy Silva is a 82 y.o. female  Back pain. Acute onset. Started 5 days ago this patient was getting into her bed. Mid back without radiation. Denies any loss of function in her lower extremities or loss of bowel or bladder function. Fairly constant with waxing and waning nature. Worse with certain movements. Denies any dysuria or frequency. Denies fevers. Associate with nausea. Diarrhea 1 one day ago. Vomiting 1 in ED that was nonbilious non-bloody. 200 mg of ibuprofen daily without improvement. 400 mg of ibuprofen 1 on day of admission with some improvement.  Assessment & Plan   Hyponatremia -Was 121 on admission, improved to 126 however once placed on IV fluid, was down to 120 -Serum osmolarity 251, urine osmolarity 302, urine sodium 41 -TSH 4.823, FT4 1.38 -Placed on fluid restriction, given small dose of lasix -Na today 128 -Possibly SIADH -PCP has not seen patient in over 3 years, Na 135 in 2013.  -Will given additional small dose of lasix today -Continue to monitor BMP  Nausea, vomiting, diarrhea -Likely viral gastroenteritis -Appears to have improved -CT abdomen and pelvis negative for any acute abdominal pathology  Dysphagia -Speech therapy consulted and appreciated -Swallow study showed esophageal dysphagia -Esophagogram showed severe dysmotility -Gastroenterology consulted and appreciated, Dr. Benson Norway- recommended correcting hyponatremia, if no change dysphagia with normalization of sodium, EGD will be pursued, continue PPI -Plan for EGD  today  Thoracic back pain -Likely musculoskeletal, no radiation -Improved -Thoracic spine x-ray showed T7 compression fracture -Physical therapy consulted and appreciated, recommended home health -Continue pain control  Hypertension -Patient has not history of hypertension, likely secondary to acute illness and stress -Continue amlodipine, hydralazine as needed  Pulmonary nodule -Seen incidentally on CT chest -Repeat CT chest in 6-12 months outpatient  Abnormal thyroid tests -TSH 4.823, Ft4 1.38 -Given acute illness/deconditioning, would recommend follow up with PCP for repeat testing once acute illness has resolved  Code Status: Full  Family Communication: None at bedside  Disposition Plan: Admitted. Pending EGD today  Time Spent in minutes   30 minutes  Procedures  CT angiogram chest, abdomen Esophagogram  Consults   Gastroenterology, Dr. Benson Norway  DVT Prophylaxis  Lovenox  Lab Results  Component Value Date   PLT 416* 05/11/2015    Medications  Scheduled Meds: . acidophilus  2 capsule Oral Daily  . amLODipine  5 mg Oral Daily  . Besifloxacin HCl  1 drop Left Eye TID WC  . [START ON 05/20/2015] Difluprednate  1 drop Left Eye Daily   Followed by  . Difluprednate  1 drop Left Eye TID   Followed by  . [START ON 05/13/2015] Difluprednate  1 drop  Left Eye BID  . enoxaparin (LOVENOX) injection  40 mg Subcutaneous Q24H  . feeding supplement  1 Container Oral TID BM  . lidocaine  1 patch Transdermal Q24H  . nepafenac  1 drop Left Eye QHS  . nepafenac  1 drop Right Eye QHS  . pantoprazole  40 mg Oral Q0600  . potassium chloride  40 mEq Oral Daily  . sodium chloride  3 mL Intravenous Q12H   Continuous Infusions:  PRN Meds:.acetaminophen **OR** acetaminophen, hydrALAZINE, methocarbamol, ondansetron **OR** ondansetron (ZOFRAN) IV, traMADol  Antibiotics    Anti-infectives    None      Subjective:   Cesilia Brandenberger seen and examined today.  Patient continues  to feel she cannot swallow well.  She was "ok with the boost."   Denies any chest pain or shortness of breath, current nausea or vomiting or diarrhea.   Objective:   Filed Vitals:   05/10/15 1230 05/10/15 1956 05/11/15 0518 05/11/15 0934  BP: 149/84 168/74 202/68 164/59  Pulse: 93 98 82   Temp: 97.8 F (36.6 C) 97.6 F (36.4 C) 98.2 F (36.8 C)   TempSrc: Oral Oral Oral   Resp: 20 20 18    Height:      Weight:   72.122 kg (159 lb)   SpO2: 96% 100% 96%     Wt Readings from Last 3 Encounters:  05/11/15 72.122 kg (159 lb)  07/20/14 75.297 kg (166 lb)  07/12/13 75.751 kg (167 lb)     Intake/Output Summary (Last 24 hours) at 05/11/15 1211 Last data filed at 05/11/15 1100  Gross per 24 hour  Intake    960 ml  Output   1800 ml  Net   -840 ml    Exam  General: Well developed, thin, elderly, NAD  HEENT: NCAT,  mucous membranes moist.   Cardiovascular: S1 S2 auscultated, 2/6 SEM, RRR  Respiratory: Clear to auscultation bilaterally   Abdomen: Soft, nontender, nondistended, + bowel sounds  Extremities: warm dry without cyanosis clubbing or edema  Neuro: AAOx3, nonfocal  Psych: Normal affect and demeanor, pleasant  Data Review   Micro Results No results found for this or any previous visit (from the past 240 hour(s)).  Radiology Reports Dg Chest 2 View  05/07/2015  CLINICAL DATA:  Mid back pain since Wednesday. New dizziness was shortness of breath and some nausea last night. EXAM: CHEST  2 VIEW COMPARISON:  Chest CT performed earlier same day. FINDINGS: Heart size is upper normal. Overall cardiomediastinal silhouette is within normal limits in size and configuration, with associated age-related ectasia of the thoracic aorta. Mild scarring versus atelectasis at each lung base, better seen on earlier chest CT. Lungs otherwise clear. No confluent opacity to suggest a developing pneumonia. No pleural effusions seen. No pneumothorax seen. There is a compression fracture  deformity of a mid thoracic vertebral body, labeled T7 on earlier CT, of uncertain age but most likely chronic. There is also a mild dextroscoliosis of the lower thoracic spine. Mild degenerative change noted within the thoracic spine. No acute-appearing osseous abnormality. IMPRESSION: Mild scarring versus atelectasis at each lung base, better seen on earlier chest CT. Lungs are otherwise clear and there is no evidence of acute cardiopulmonary abnormality. Compression fracture deformity of the T7 vertebral body, of uncertain age, but most likely old, also better seen on earlier CT. Electronically Signed   By: Franki Cabot M.D.   On: 05/07/2015 12:24   Dg Thoracic Spine 2 View  05/08/2015  CLINICAL DATA:  Sudden onset of back pain while getting into bed. EXAM: THORACIC SPINE 2 VIEWS COMPARISON:  Chest radiograph and chest CT, 05/07/2015 FINDINGS: Moderate compression fracture of T7, unchanged from the previous day's exams. No other thoracic fractures. Fractures of L1 and L2 are can, also stable from the prior CT. No bone lesion. Bones are demineralized. Minor degenerative endplate spurring is noted along the mid and lower thoracic spine Soft tissues are unremarkable. IMPRESSION: 1. Moderate compression fracture of T7, with compression fractures also noted at of L1 and L2, unchanged from the previous day's CT scan. 2. No other fractures.  No lesions.  Minor degenerative changes. Electronically Signed   By: Lajean Manes M.D.   On: 05/08/2015 12:56   Ct Angio Abdomen W/cm &/or Wo Contrast  05/07/2015  CLINICAL DATA:  82 year old female with mid back pain, hypertension and nausea/vomiting EXAM: CT ANGIOGRAPHY CHEST, ABDOMEN AND PELVIS TECHNIQUE: Multidetector CT imaging through the chest, abdomen and pelvis was performed using the standard protocol during bolus administration of intravenous contrast. Multiplanar reconstructed images and MIPs were obtained and reviewed to evaluate the vascular anatomy. CONTRAST:   48mL OMNIPAQUE IOHEXOL 350 MG/ML SOLN COMPARISON:  Prior MRI lumbar spine 04/18/2012 FINDINGS: CTA CHEST FINDINGS Mediastinum: Unremarkable CT appearance of the thyroid gland. No suspicious mediastinal or hilar adenopathy. No soft tissue mediastinal mass. The thoracic esophagus is unremarkable. Heart/Vascular: No evidence of acute intramural hematoma on the initial non contrasted images. Following the administration of intravenous contrast. There is a conventional 3 vessel arch morphology. No evidence of aneurysm or acute dissection. Mild atherosclerotic vascular calcifications without significant stenosis. Calcifications present along the course of the left anterior descending, circumflex and right coronary arteries. Concentric hypertrophy of the left ventricle. The heart is at the upper limits of normal for size. No pericardial effusion. Normal caliber main and central pulmonary arteries. No evidence of acute pulmonary embolus. Lungs/Pleura: 5-6 mm nodule in the superior aspect of the right middle lobe (image 27 series 5). Adjacent 3 mm ground-glass attenuation nodule on the same image. 4 mm nodule slightly more inferior on image 31. Dependent atelectasis in both lower lobes. 3 mm ground-glass attenuation nodular opacity in the left lung apex on image 7 of series 5. Probable 5 mm pulmonary nodule in the left lower lobe (image 45 series 5). No evidence of focal airspace consolidation, pulmonary edema, pleural effusion or pneumothorax. Bones/Soft Tissues: No acute fracture or aggressive appearing lytic or blastic osseous lesion. T7 compression fracture with approximately 50% height loss anteriorly. Review of the MIP images confirms the above findings. CTA ABDOMEN AND PELVIS FINDINGS VASCULAR Aorta: Tortuous but normal caliber abdominal aorta. No evidence of acute dissection. Scattered calcified atherosclerotic plaque. No significant or irregular mural thrombus. Celiac: Variant anatomy. The splenic and left gastric  artery arise from a common trunk. The proper hepatic artery arises independently from the aorta. Atherosclerotic plaque at the origin of both vessels results in at least mild narrowing. No visceral artery aneurysm. SMA: Widely patent and unremarkable. Renals: Single dominant renal arteries bilaterally which are widely patent without evidence of fibromuscular dysplasia. Accessory renal artery to the lower pole of the left kidney. IMA: Atherosclerotic plaque results in at least mild narrowing of the proximal vessel. The distal vessel remains patent. Inflow: Tortuous and mildly ectatic but no aneurysm, dissection or high-grade stenosis. Proximal Outflow: Visualized portions are relatively disease free. Veins: No focal venous abnormality. NON-VASCULAR Abdomen: Moderate respiratory motion artifact in the upper abdomen. Unremarkable CT appearance of the stomach, duodenum,  spleen, adrenal glands and pancreas. Normal hepatic contour and morphology. No discrete hepatic lesion. Gallbladder is unremarkable. No intra or extrahepatic biliary ductal dilatation. Unremarkable appearance of the bilateral kidneys. No focal solid lesion, hydronephrosis or nephrolithiasis. Colonic diverticular disease without CT evidence of active inflammation. The colon is decompressed. No focal bowel wall thickening or evidence of obstruction. No free fluid or suspicious adenopathy. Pelvis: The uterus is surgically absent. The bladder is dilated with urine. No free fluid or suspicious adenopathy. Bones/Soft Tissues: The bones appear diffusely demineralized. There is no evidence of acute fracture, malalignment or aggressive lytic or blastic osseous lesion. Advanced multilevel degenerative disc disease. Lower lumbar facet arthropathy. L1 and L2 compression fractures without significant interval change compared to prior MRI. Review of the MIP images confirms the above findings. IMPRESSION: CTA CHEST 1. No acute dissection, aneurysm or other evidence of  acute aortic syndrome. 2. Atherosclerotic vascular calcifications including 3 vessel coronary artery calcifications. 3. Concentric hypertrophy of the left ventricle. Does the patient have a clinical history of systemic arterial hypertension? 4. Multiple pulmonary nodules measuring up to 5 mm. While these are highly likely (in the absence of a known primary malignancy. If the patient has a known primary malignancy then metastatic disease would be more likely) the sequelae of a prior infectious/inflammatory or granulomatous process, stability cannot be confirmed without prior imaging for comparison. Consider follow-up CT scan of the chest in 6-12 months if the documentation of stability is clinically warranted. 5. Age indeterminate (without prior imaging for comparison) but likely chronic T7 compression fracture. CTA ABD/PELVIS 1. Moderate respiratory/patient motion artifact. 2. No evidence of acute aortic dissection, aneurysm or other acute vascular abnormality. 3. Incidental note is made of variant anatomy of the celiac artery origin. 4. Atherosclerotic vascular calcifications without significant stenosis to suggest a source for chronic mesenteric ischemia. 5. Colonic diverticular disease without CT evidence of active inflammation. 6. Chronic L1 and L2 compression fractures without evidence of progressive height loss compared to 04/18/2012. Signed, Criselda Peaches, MD [facility] Electronically Signed   By: Jacqulynn Cadet M.D.   On: 05/07/2015 11:40   Dg Esophagus  05/09/2015  CLINICAL DATA:  Frequent eructation with liquid intake, globus sensation, and reported feeling of food getting "stuck" in her chest. She does have delayed coughing throughout intake, although vocal quality remains clear. Suspect that this is related to possible esophageal issues, although also cannot rule out backflow into the pharynx / larynx post-swallow EXAM:  ESOPHOGRAM/BARIUM SWALLOW TECHNIQUE: Single contrast examination was performed using  thin barium. FLUOROSCOPY TIME:  Radiation Exposure Index (as provided by the fluoroscopic device): If the device does not provide the exposure index: Fluoroscopy Time:  36 secs Number of Acquired Images:  3 COMPARISON:  CT chest 05/07/2015 FINDINGS: The first swallow of barium passed quickly into the stomach. Subsequent sips remain static within the esophagus. There was to and fro motion of the barium bolus into the high thoracic esophagus. There was little peristaltic wave action and no tertiary contractions. No retained food stuff within the esophagus. No esophageal dilatation. IMPRESSION: 1. Significant esophageal dysmotility with stasis of the barium bolus within the esophagus and to and fro motion in the high cervical esophagus. No tertiary contractions. 2. No evidence obstruction or retained food stuff. Electronically Signed   By: Suzy Bouchard M.D.   On: 05/09/2015 09:19   Ct Angio Chest Aorta W/cm &/or Wo/cm  05/07/2015  CLINICAL DATA:  82 year old female with mid back  pain, hypertension and nausea/vomiting EXAM: CT ANGIOGRAPHY CHEST, ABDOMEN AND PELVIS TECHNIQUE: Multidetector CT imaging through the chest, abdomen and pelvis was performed using the standard protocol during bolus administration of intravenous contrast. Multiplanar reconstructed images and MIPs were obtained and reviewed to evaluate the vascular anatomy. CONTRAST:  58mL OMNIPAQUE IOHEXOL 350 MG/ML SOLN COMPARISON:  Prior MRI lumbar spine 04/18/2012 FINDINGS: CTA CHEST FINDINGS Mediastinum: Unremarkable CT appearance of the thyroid gland. No suspicious mediastinal or hilar adenopathy. No soft tissue mediastinal mass. The thoracic esophagus is unremarkable. Heart/Vascular: No evidence of acute intramural hematoma on the initial non contrasted images. Following the administration of intravenous contrast. There is a conventional 3 vessel arch morphology. No  evidence of aneurysm or acute dissection. Mild atherosclerotic vascular calcifications without significant stenosis. Calcifications present along the course of the left anterior descending, circumflex and right coronary arteries. Concentric hypertrophy of the left ventricle. The heart is at the upper limits of normal for size. No pericardial effusion. Normal caliber main and central pulmonary arteries. No evidence of acute pulmonary embolus. Lungs/Pleura: 5-6 mm nodule in the superior aspect of the right middle lobe (image 27 series 5). Adjacent 3 mm ground-glass attenuation nodule on the same image. 4 mm nodule slightly more inferior on image 31. Dependent atelectasis in both lower lobes. 3 mm ground-glass attenuation nodular opacity in the left lung apex on image 7 of series 5. Probable 5 mm pulmonary nodule in the left lower lobe (image 45 series 5). No evidence of focal airspace consolidation, pulmonary edema, pleural effusion or pneumothorax. Bones/Soft Tissues: No acute fracture or aggressive appearing lytic or blastic osseous lesion. T7 compression fracture with approximately 50% height loss anteriorly. Review of the MIP images confirms the above findings. CTA ABDOMEN AND PELVIS FINDINGS VASCULAR Aorta: Tortuous but normal caliber abdominal aorta. No evidence of acute dissection. Scattered calcified atherosclerotic plaque. No significant or irregular mural thrombus. Celiac: Variant anatomy. The splenic and left gastric artery arise from a common trunk. The proper hepatic artery arises independently from the aorta. Atherosclerotic plaque at the origin of both vessels results in at least mild narrowing. No visceral artery aneurysm. SMA: Widely patent and unremarkable. Renals: Single dominant renal arteries bilaterally which are widely patent without evidence of fibromuscular dysplasia. Accessory renal artery to the lower pole of the left kidney. IMA: Atherosclerotic plaque results in at least mild narrowing of  the proximal vessel. The distal vessel remains patent. Inflow: Tortuous and mildly ectatic but no aneurysm, dissection or high-grade stenosis. Proximal Outflow: Visualized portions are relatively disease free. Veins: No focal venous abnormality. NON-VASCULAR Abdomen: Moderate respiratory motion artifact in the upper abdomen. Unremarkable CT appearance of the stomach, duodenum, spleen, adrenal glands and pancreas. Normal hepatic contour and morphology. No discrete hepatic lesion. Gallbladder is unremarkable. No intra or extrahepatic biliary ductal dilatation. Unremarkable appearance of the bilateral kidneys. No focal solid lesion, hydronephrosis or nephrolithiasis. Colonic diverticular disease without CT evidence of active inflammation. The colon is decompressed. No focal bowel wall thickening or evidence of obstruction. No free fluid or suspicious adenopathy. Pelvis: The uterus is surgically absent. The bladder is dilated with urine. No free fluid or suspicious adenopathy. Bones/Soft Tissues: The bones appear diffusely demineralized. There is no evidence of acute fracture, malalignment or aggressive lytic or blastic osseous lesion. Advanced multilevel degenerative disc disease. Lower lumbar facet arthropathy. L1 and L2 compression fractures without significant interval change compared to prior MRI. Review of the MIP images confirms the above findings. IMPRESSION: CTA CHEST 1. No acute dissection,  aneurysm or other evidence of acute aortic syndrome. 2. Atherosclerotic vascular calcifications including 3 vessel coronary artery calcifications. 3. Concentric hypertrophy of the left ventricle. Does the patient have a clinical history of systemic arterial hypertension? 4. Multiple pulmonary nodules measuring up to 5 mm. While these are highly likely (in the absence of a known primary malignancy. If the patient has a known primary malignancy then metastatic disease would be more likely) the sequelae of a prior  infectious/inflammatory or granulomatous process, stability cannot be confirmed without prior imaging for comparison. Consider follow-up CT scan of the chest in 6-12 months if the documentation of stability is clinically warranted. 5. Age indeterminate (without prior imaging for comparison) but likely chronic T7 compression fracture. CTA ABD/PELVIS 1. Moderate respiratory/patient motion artifact. 2. No evidence of acute aortic dissection, aneurysm or other acute vascular abnormality. 3. Incidental note is made of variant anatomy of the celiac artery origin. 4. Atherosclerotic vascular calcifications without significant stenosis to suggest a source for chronic mesenteric ischemia. 5. Colonic diverticular disease without CT evidence of active inflammation. 6. Chronic L1 and L2 compression fractures without evidence of progressive height loss compared to 04/18/2012. Signed, Criselda Peaches, MD [facility] Electronically Signed   By: Jacqulynn Cadet M.D.   On: 05/07/2015 11:40    CBC  Recent Labs Lab 05/07/15 0945 05/07/15 1004 05/08/15 0534 05/09/15 0538 05/10/15 0415 05/11/15 0434  WBC 11.8*  --  11.3* 12.4* 11.0* 9.3  HGB 12.9 14.6 12.0 12.6 12.3 11.8*  HCT 37.7 43.0 35.3* 36.1 36.8 34.8*  PLT 379  --  384 415* 429* 416*  MCV 80.2  --  80.8 79.7 80.3 79.6  MCH 27.4  --  27.5 27.8 26.9 27.0  MCHC 34.2  --  34.0 34.9 33.4 33.9  RDW 13.1  --  13.2 13.4 13.4 13.4    Chemistries   Recent Labs Lab 05/07/15 0945  05/08/15 0534 05/09/15 0538 05/09/15 1545 05/10/15 0415 05/11/15 0434  NA 121*  < > 126* 120* 121* 123* 128*  K 3.6  < > 3.2* 3.4* 4.0 4.4 3.9  CL 88*  < > 93* 93* 90* 93* 99*  CO2 19*  < > 24 20* 21* 23 23  GLUCOSE 124*  < > 118* 109* 124* 107* 104*  BUN 7  < > <5* 6 6 5* 8  CREATININE 0.52  < > 0.58 0.58 0.63 0.58 0.55  CALCIUM 8.7*  < > 8.3* 8.0* 8.9 8.7* 8.7*  AST 21  --   --   --   --   --   --   ALT 15   --   --   --   --   --   --   ALKPHOS 71  --   --   --   --   --   --   BILITOT 0.9  --   --   --   --   --   --   < > = values in this interval not displayed. ------------------------------------------------------------------------------------------------------------------ estimated creatinine clearance is 54.2 mL/min (by C-G formula based on Cr of 0.55). ------------------------------------------------------------------------------------------------------------------ No results for input(s): HGBA1C in the last 72 hours. ------------------------------------------------------------------------------------------------------------------ No results for input(s): CHOL, HDL, LDLCALC, TRIG, CHOLHDL, LDLDIRECT in the last 72 hours. ------------------------------------------------------------------------------------------------------------------  Recent Labs  05/09/15 1545  TSH 4.823*   ------------------------------------------------------------------------------------------------------------------ No results for input(s): VITAMINB12, FOLATE, FERRITIN, TIBC, IRON, RETICCTPCT in the last 72 hours.  Coagulation profile No results  for input(s): INR, PROTIME in the last 168 hours.  No results for input(s): DDIMER in the last 72 hours.  Cardiac Enzymes No results for input(s): CKMB, TROPONINI, MYOGLOBIN in the last 168 hours.  Invalid input(s): CK ------------------------------------------------------------------------------------------------------------------ Invalid input(s): POCBNP    Alisse Tuite D.O. on 05/11/2015 at 12:11 PM  Between 7am to 7pm - Pager - [phone]  After 7pm go to [email or web address] - password [email or web address]  And look for the night coverage person covering for me after hours  [facility] Office  [phone]

## 2015-05-11 NOTE — H&P (View-Only) (Signed)
   UNASSIGNED PATIENT Subjective: Patient seems worried about "not being able to eat" for the last 2 days. Claims she was fine till 2 days ago when she developed dysphagia. Severe dysmotility noted on esophagogram.   Objective: Vital signs in last 24 hours: Temp:  [97.8 F (36.6 C)-98.3 F (36.8 C)] 97.8 F (36.6 C) (01/11 1230) Pulse Rate:  [70-93] 93 (01/11 1230) Resp:  [20] 20 (01/11 1230) BP: (149-169)/(67-84) 149/84 mmHg (01/11 1230) SpO2:  [95 %-97 %] 96 % (01/11 1230) Weight:  [73.347 kg (161 lb 11.2 oz)] 73.347 kg (161 lb 11.2 oz) (01/11 0300) Last BM Date: 05/07/15  Intake/Output from previous day: 01/10 0701 - 01/11 0700 In: 1300 [P.O.:1300] Out: 1200 [Urine:1200] Intake/Output this shift:   General appearance: alert, fatigued, no distress and pale Resp: clear to auscultation bilaterally Cardio: regular rate and rhythm, S1, S2 normal, no murmur, click, rub or gallop GI: soft, non-tender; bowel sounds normal; no masses,  no organomegaly  Lab Results:  Recent Labs  05/08/15 0534 05/09/15 0538 05/10/15 0415  WBC 11.3* 12.4* 11.0*  HGB 12.0 12.6 12.3  HCT 35.3* 36.1 36.8  PLT 384 415* 429*   BMET  Recent Labs  05/09/15 0538 05/09/15 1545 05/10/15 0415  NA 120* 121* 123*  K 3.4* 4.0 4.4  CL 93* 90* 93*  CO2 20* 21* 23  GLUCOSE 109* 124* 107*  BUN 6 6 5*  CREATININE 0.58 0.63 0.58  CALCIUM 8.0* 8.9 8.7*   Studies/Results: Dg Esophagus  05/09/2015  CLINICAL DATA:  Frequent eructation with liquid intake, globus sensation, and reported feeling of food getting "stuck" in her chest. She does have delayed coughing throughout intake, although vocal quality remains clear. Suspect that this is related to possible esophageal issues, although also cannot rule out backflow into the pharynx / larynx post-swallow EXAM: ESOPHOGRAM/BARIUM SWALLOW TECHNIQUE: Single contrast examination was performed using  thin barium. FLUOROSCOPY TIME:  Radiation Exposure Index (as  provided by the fluoroscopic device): If the device does not provide the exposure index: Fluoroscopy Time:  36 secs Number of Acquired Images:  3 COMPARISON:  CT chest 05/07/2015 FINDINGS: The first swallow of barium passed quickly into the stomach. Subsequent sips remain static within the esophagus. There was to and fro motion of the barium bolus into the high thoracic esophagus. There was little peristaltic wave action and no tertiary contractions. No retained food stuff within the esophagus. No esophageal dilatation. IMPRESSION: 1. Significant esophageal dysmotility with stasis of the barium bolus within the esophagus and to and fro motion in the high cervical esophagus. No tertiary contractions. 2. No evidence obstruction or retained food stuff. Electronically Signed   By: Suzy Bouchard M.D.   On: 05/09/2015 09:19   Medications: I have reviewed the patient's current medications.  Assessment/Plan: Dysphagia: Will plan to do an EGD tomorrow.    Cathy Silva 05/10/2015, 1:20 PM

## 2015-05-11 NOTE — Op Note (Signed)
[facility] [place], [address]   ENDOSCOPY PROCEDURE REPORT  PATIENT: Cathy, Silva  MR#: [id number] BIRTHDATE: [birth year] , 69  yrs. old GENDER: female ENDOSCOPIST:Kasir Hallenbeck Benson [place], MD REFERRED BY: PROCEDURE DATE:  05-29-2015 PROCEDURE:   EGD, diagnostic ASA CLASS:    Class III INDICATIONS: Dysphagia MEDICATION: Versed 4 mg IV and Fentanyl 50 mcg IV TOPICAL ANESTHETIC:   none  DESCRIPTION OF PROCEDURE:   After the risks and benefits of the procedure were explained, informed consent was obtained.  The PENTAX GASTOROSCOPE [id number]  endoscope was introduced through the mouth  and advanced to the second portion of the duodenum .  The instrument was slowly withdrawn as the mucosa was fully examined. Estimated blood loss is zero unless otherwise noted in this procedure report.   FINDINGS: At 30-35 cm there was evidence of a reflux esohpagitis. No evidence of ulcerations or erosions to suggest a malignant or infectious etiology.  In the distal esophagus around 40 cm there was evidence of a mild or healing esophagitis.  No evidence of any strictures.  A small 1-2 cm sliding hiatal hernia was found.  No abnormalities in the gastric lumen or the duodenal lumen. Retroflexed views revealed no abnormalities.    The scope was then withdrawn from the patient and the procedure completed.  COMPLICATIONS: There were no immediate complications.  ENDOSCOPIC IMPRESSION: 1) LA Class D esophagitis. 2) Small sliding hiatal hernia.  RECOMMENDATIONS: 1) PPI BID x 1 month and then QD indefinitely. _______________________________ eSignedCarol Ada, MD 05/29/2015 2:46 PM     cc: CPT CODES: ICD CODES:

## 2015-05-12 ENCOUNTER — Encounter (HOSPITAL_COMMUNITY): Payer: Self-pay | Admitting: Gastroenterology

## 2015-05-12 DIAGNOSIS — K209 Esophagitis, unspecified: Secondary | ICD-10-CM

## 2015-05-12 LAB — CBC
HCT: 34.1 % — ABNORMAL LOW (ref 36.0–46.0)
HEMOGLOBIN: 11.9 g/dL — AB (ref 12.0–15.0)
MCH: 27.8 pg (ref 26.0–34.0)
MCHC: 34.9 g/dL (ref 30.0–36.0)
MCV: 79.7 fL (ref 78.0–100.0)
PLATELETS: 443 10*3/uL — AB (ref 150–400)
RBC: 4.28 MIL/uL (ref 3.87–5.11)
RDW: 13.8 % (ref 11.5–15.5)
WBC: 9.2 10*3/uL (ref 4.0–10.5)

## 2015-05-12 LAB — BASIC METABOLIC PANEL
Anion gap: 8 (ref 5–15)
BUN: 10 mg/dL (ref 6–20)
CHLORIDE: 99 mmol/L — AB (ref 101–111)
CO2: 23 mmol/L (ref 22–32)
Calcium: 8.7 mg/dL — ABNORMAL LOW (ref 8.9–10.3)
Creatinine, Ser: 0.61 mg/dL (ref 0.44–1.00)
GFR calc Af Amer: >60 mL/min (ref >60)
GFR calc non Af Amer: >60 mL/min (ref >60)
GLUCOSE: 103 mg/dL — AB (ref 65–99)
POTASSIUM: 3.8 mmol/L (ref 3.5–5.1)
Sodium: 130 mmol/L — ABNORMAL LOW (ref 135–145)

## 2015-05-12 MED ORDER — PANTOPRAZOLE SODIUM 40 MG PO TBEC: 40.0000 mg | DELAYED_RELEASE_TABLET | Freq: Two times a day (BID) | ORAL | Status: DC

## 2015-05-12 MED ORDER — BOOST / RESOURCE BREEZE PO LIQD: 1.0000 | Freq: Three times a day (TID) | ORAL | Status: DC

## 2015-05-12 MED ORDER — TRAMADOL HCL 50 MG PO TABS
50.0000 mg | ORAL_TABLET | Freq: Four times a day (QID) | ORAL | Status: DC | PRN
Start: 2015-05-12 — End: 2016-02-19

## 2015-05-12 MED ORDER — AMLODIPINE BESYLATE 5 MG PO TABS: 5.0000 mg | ORAL_TABLET | Freq: Every day | ORAL | Status: DC

## 2015-05-12 NOTE — Discharge Instructions (Signed)
Esophagitis °Esophagitis is inflammation of the esophagus. The esophagus is the tube that carries food and liquids from your mouth to your stomach. Esophagitis can cause soreness or pain in the esophagus. This condition can make it difficult and painful to swallow.  °CAUSES °Most causes of esophagitis are not serious. Common causes of this condition include: °· Gastroesophageal reflux disease (GERD). This is when stomach contents move back up into the esophagus (reflux). °· Repeated vomiting. °· An allergic-type reaction, especially caused by food allergies (eosinophilic esophagitis). °· Injury to the esophagus by swallowing large pills with or without water, or swallowing certain types of medicines. °· Swallowing (ingesting) harmful chemicals, such as household cleaning products. °· Heavy alcohol use. °· An infection of the esophagus. This most often occurs in people who have a weakened immune system. °· Radiation or chemotherapy treatment for cancer. °· Certain diseases such as sarcoidosis, Crohn disease, and scleroderma. °SYMPTOMS °Symptoms of this condition include: °· Difficult or painful swallowing. °· Pain with swallowing acidic liquids, such as citrus juices. °· Pain with burping. °· Chest pain. °· Difficulty breathing. °· Nausea. °· Vomiting. °· Pain in the abdomen. °· Weight loss. °· Ulcers in the mouth. °· Patches of white material in the mouth (candidiasis). °· Fever. °· Coughing up blood or vomiting blood. °· Stool that is black, tarry, or bright red. °DIAGNOSIS °Your health care provider will take a medical history and perform a physical exam. You may also have other tests, including: °· An endoscopy to examine your stomach and esophagus with a small camera. °· A test that measures the acidity level in your esophagus. °· A test that measures how much pressure is on your esophagus. °· A barium swallow or modified barium swallow to show the shape, size, and functioning of your esophagus. °· Allergy  tests. °TREATMENT °Treatment for this condition depends on the cause of your esophagitis. In some cases, steroids or other medicines may be given to help relieve your symptoms or to treat the underlying cause of your condition. You may have to make some lifestyle changes, such as: °· Avoiding alcohol. °· Quitting smoking. °· Changing your diet. °· Exercising. °· Changing your sleep habits and your sleep environment. °HOME CARE INSTRUCTIONS °Take these actions to decrease your discomfort and to help avoid complications. °Diet °· Follow a diet as recommended by your health care provider. This may involve avoiding foods and drinks such as: °¨ Coffee and tea (with or without caffeine). °¨ Drinks that contain alcohol. °¨ Energy drinks and sports drinks. °¨ Carbonated drinks or sodas. °¨ Chocolate and cocoa. °¨ Peppermint and mint flavorings. °¨ Garlic and onions. °¨ Horseradish. °¨ Spicy and acidic foods, including peppers, chili powder, curry powder, vinegar, hot sauces, and barbecue sauce. °¨ Citrus fruit juices and citrus fruits, such as oranges, lemons, and limes. °¨ Tomato-based foods, such as red sauce, chili, salsa, and pizza with red sauce. °¨ Fried and fatty foods, such as donuts, french fries, potato chips, and high-fat dressings. °¨ High-fat meats, such as hot dogs and fatty cuts of red and white meats, such as rib eye steak, sausage, ham, and bacon. °¨ High-fat dairy items, such as whole milk, butter, and cream cheese. °· Eat small, frequent meals instead of large meals. °· Avoid drinking large amounts of liquid with your meals. °· Avoid eating meals during the 2-3 hours before bedtime. °· Avoid lying down right after you eat. °· Do not exercise right after you eat. °· Avoid foods and drinks that seem to   make your symptoms worse. General Instructions  Pay attention to any changes in your symptoms.  Take over-the-counter and prescription medicines only as told by your health care provider. Do not take  aspirin, ibuprofen, or other NSAIDs unless your health care provider told you to do so.  If you have trouble taking pills, use a pill splitter to decrease the size of the pill. This will decrease the chance of the pill getting stuck or injuring your esophagus on the way down. Also, drink water after you take a pill.  Do not use any tobacco products, including cigarettes, chewing tobacco, and e-cigarettes. If you need help quitting, ask your health care provider.  Wear loose-fitting clothing. Do not wear anything tight around your waist that causes pressure on your abdomen.  Raise (elevate) the head of your bed about 6 inches (15 cm).  Try to reduce your stress, such as with yoga or meditation. If you need help reducing stress, ask your health care provider.  If you are overweight, reduce your weight to an amount that is healthy for you. Ask your health care provider for guidance about a safe weight loss goal.  Keep all follow-up visits as told by your health care provider. This is important. SEEK MEDICAL CARE IF:  You have new symptoms.  You have unexplained weight loss.  You have difficulty swallowing, or it hurts to swallow.  You have wheezing or a persistent cough.  Your symptoms do not improve with treatment.  You have frequent heartburn for more than two weeks. SEEK IMMEDIATE MEDICAL CARE IF:  You have severe pain in your arms, neck, jaw, teeth, or back.  You feel sweaty, dizzy, or light-headed.  You have chest pain or shortness of breath.  You vomit and your vomit looks like blood or coffee grounds.  Your stool is bloody or black.  You have a fever.  You cannot swallow, drink, or eat.   This information is not intended to replace advice given to you by your health care provider. Make sure you discuss any questions you have with your health care provider.   Document Released: 05/23/2004 Document Revised: 01/04/2015 Document Reviewed: 08/10/2014 Elsevier Interactive  Patient Education 2016 [employer]. Hyponatremia Hyponatremia is when the amount of salt (sodium) in your blood is too low. When sodium levels are low, your cells absorb extra water and they swell. The swelling happens throughout the body, but it mostly affects the brain. CAUSES This condition may be caused by:  Heart, kidney, or liver problems.  Thyroid problems.  Adrenal gland problems.  Metabolic conditions, such as syndrome of inappropriate antidiuretic hormone (SIADH).  Severe vomiting and diarrhea.  Certain medicines or illegal drugs.  Dehydration.  Drinking too much water.  Eating a diet that is low in sodium.  Large burns on your body.  Sweating. RISK FACTORS This condition is more likely to develop in people who:  Have long-term (chronic) kidney disease.  Have heart failure.  Have a medical condition that causes frequent or excessive diarrhea.  Have metabolic conditions, such as Addison disease or SIADH.  Take certain medicines that affect the sodium and fluid balance in the blood. Some of these medicine types include:  Diuretics.  NSAIDs.  Some opioid pain medicines.  Some antidepressants.  Some seizure prevention medicines. SYMPTOMS  Symptoms of this condition include:  Nausea and vomiting.  Confusion.  Lethargy.  Agitation.  Headache.  Seizures.  Unconsciousness.  Appetite loss.  Muscle weakness and cramping.  Feeling weak or  light-headed.  Having a rapid heart rate.  Fainting, in severe cases. DIAGNOSIS This condition is diagnosed with a medical history and physical exam. You will also have other tests, including:  Blood tests.  Urine tests. TREATMENT Treatment for this condition depends on the cause. Treatment may include:  Fluids given through an IV tube that is inserted into one of your veins.  Medicines to correct the sodium imbalance. If medicines are causing the condition, the medicines will need to be  adjusted.  Limiting water or fluid intake to get the correct sodium balance. HOME CARE INSTRUCTIONS  Take medicines only as directed by your health care provider. Many medicines can make this condition worse. Talk with your health care provider about any medicines that you are currently taking.  Carefully follow a recommended diet as directed by your health care provider.  Carefully follow instructions from your health care provider about fluid restrictions.  Keep all follow-up visits as directed by your health care provider. This is important.  Do not drink alcohol. SEEK MEDICAL CARE IF:  You develop worsening nausea, fatigue, headache, confusion, or weakness.  Your symptoms go away and then return.  You have problems following the recommended diet. SEEK IMMEDIATE MEDICAL CARE IF:  You have a seizure.  You faint.  You have ongoing diarrhea or vomiting.   This information is not intended to replace advice given to you by your health care provider. Make sure you discuss any questions you have with your health care provider.   Document Released: 04/05/2002 Document Revised: 08/30/2014 Document Reviewed: 05/05/2014 Elsevier Interactive Patient Education [employer].

## 2015-05-12 NOTE — Progress Notes (Signed)
Pt has orders to be discharged. Discharge instructions given and pt has no additional questions at this time. Medication regimen reviewed and pt educated. Pt verbalized understanding and has no additional questions. Telemetry box removed. IV removed and site in good condition. Pt stable and waiting for transportation.   Annaliza Zia RN 

## 2015-05-12 NOTE — Discharge Summary (Signed)
Physician Discharge Summary  Cathy Silva [id number] DOB: [birth year] DOA: 05/07/2015  PCP: [name], MD  Admit date: 05/07/2015 Discharge date: 05/12/2015  Time spent: 45 minutes  Recommendations for Outpatient Follow-up:  Patient will be discharged to home with home health, PT.  Patient will need to follow up with primary care provider within one week of discharge, repeat BMP and discuss thyroid and blood pressure control.  Patient should continue medications as prescribed.  Patient should follow a heart healthy diet.  Patient will need repeat CT chest in 6-12 months regarding pulmonary nodule follow up.  Discharge Diagnoses:  Hyponatremia Nausea, vomiting, diarrhea Dysphagia Thoracic back pain Hypertension Pulmonary nodule Abnormal thyroid tests  Discharge Condition: Stable  Diet recommendation: heart healthy  Filed Weights   05/10/15 0300 05/11/15 0518 05/12/15 0421  Weight: 73.347 kg (161 lb 11.2 oz) 72.122 kg (159 lb) 71.215 kg (157 lb)    History of present illness:  on 05/07/2015 by Dr. Linna Darner Dewaine Cathy Silva is a 82 y.o. female  Back pain. Acute onset. Started 5 days ago this patient was getting into her bed. Mid back without radiation. Denies any loss of function in her lower extremities or loss of bowel or bladder function. Fairly constant with waxing and waning nature. Worse with certain movements. Denies any dysuria or frequency. Denies fevers. Associate with nausea. Diarrhea 1 one day ago. Vomiting 1 in ED that was nonbilious non-bloody. 200 mg of ibuprofen daily without improvement. 400 mg of ibuprofen 1 on day of admission with some improvement.  Hospital Course:  Hyponatremia -Was 121 on admission, improved to 126 however once placed on IV fluid, was down to 120 -Serum osmolarity 251, urine osmolarity 302, urine sodium 41 -TSH 4.823, FT4 1.38 -Placed on fluid restriction -Possibly SIADH- patient also had nausea which may have stimulated  more ADH secretion -PCP has not seen patient in over 3 years, Na 135 in 2013.  -Patient given lasix, Na improved to 130 today -Repeat BMP in one week -Discussed diet modifications and fluid intake restrictions with patient.    Nausea, vomiting, diarrhea -Likely viral gastroenteritis -Appears to have improved -CT abdomen and pelvis negative for any acute abdominal pathology  Dysphagia -Speech therapy consulted and appreciated -Swallow study showed esophageal dysphagia -Esophagogram showed severe dysmotility -Gastroenterology consulted and appreciated, Dr. Benson Norway- recommended correcting hyponatremia, if no change dysphagia with normalization of sodium, EGD will be pursued, continue PPI -EGD showed esophagitis (LA Class D), small sliding hiatal hernia.  Recommendations: PPI BID for one month, then daily thereafter -Spoke with patient regarding her diet, she has been eating 3 tomatoes per day along with several glasses of orange juice.  Advised patient to restrict spicy, saucy, or high acidic food/beverages.   Thoracic back pain -Likely musculoskeletal, no radiation -Improved -Thoracic spine x-ray showed T7 compression fracture -Physical therapy consulted and appreciated, recommended home health -Continue pain control  Hypertension -Patient has not history of hypertension, likely secondary to acute illness and stress -Continue amlodipine -Patient should also follow up with her PCP regarding BP control  Pulmonary nodule -Seen incidentally on CT chest -Repeat CT chest in 6-12 months outpatient  Abnormal thyroid tests -TSH 4.823, Ft4 1.38 -Given acute illness/deconditioning, would recommend follow up with PCP for repeat testing once acute illness has resolved  Procedures  CT angiogram chest, abdomen Esophagogram EGD  Consults  Gastroenterology, Dr. Benson Norway  Discharge Exam: Filed Vitals:   05/11/15 2141 05/12/15 0421  BP: 159/60 166/65  Pulse: 93 86  Temp: 98.2  F (36.8 C)  98.6 F (37 C)  Resp: 16 14   Exam  General: Well developed, thin, elderly, NAD  HEENT: NCAT, mucous membranes moist.   Cardiovascular: S1 S2 auscultated, 2/6 SEM, RRR  Respiratory: Clear to auscultation bilaterally  Abdomen: Soft, nontender, nondistended, + bowel sounds  Extremities: warm dry without cyanosis clubbing or edema  Neuro: AAOx3, nonfocal  Psych: Normal affect and demeanor, pleasant  Discharge Instructions      Discharge Instructions    Discharge instructions    Complete by:  As directed   Patient will be discharged to home with home health, PT.  Patient will need to follow up with primary care provider within one week of discharge, repeat BMP and discuss thyroid and blood pressure control.  Patient should continue medications as prescribed.  Patient should follow a heart healthy diet.  Patient will need repeat CT chest in 6-12 months regarding pulmonary nodule follow up.            Medication List    TAKE these medications        amLODipine 5 MG tablet  Commonly known as:  NORVASC  Take 1 tablet (5 mg total) by mouth daily.     Betaine HCl 650-130 MG Caps  Take by mouth.     CALCIUM + D PO  Take 1,200 mg by mouth.     CLARITIN PO  Take 10 mg by mouth daily as needed. For allergies     feeding supplement Liqd  Take 1 Container by mouth 3 (three) times daily between meals.     Fish Oil 1200 MG Caps  Take by mouth.     MULTIVITAMIN PO  Take by mouth.     pantoprazole 40 MG tablet  Commonly known as:  PROTONIX  Take 1 tablet (40 mg total) by mouth 2 (two) times daily.     traMADol 50 MG tablet  Commonly known as:  ULTRAM  Take 1 tablet (50 mg total) by mouth every 6 (six) hours as needed for moderate pain (or Headache unrelieved by tylenol).     vitamin C 1000 MG tablet  Take 1,000 mg by mouth daily.       Allergies  Allergen Reactions  . Codeine Other (See Comments)    Unknown reaction  . Other     novacane  . Pneumococcal  Vaccines Other (See Comments)    Unknown reaction  . Pseudoephedrine Other (See Comments)    Unknown reaction  . Tetanus Toxoids     Swelling at injecton site   Follow-up Information    Follow up with [name], MD. Schedule an appointment as soon as possible for a visit in 1 week.   Specialty:  Family Medicine   Why:  Hospital follow up   Contact information:   [place] [address] [address] [phone]        The results of significant diagnostics from this hospitalization (including imaging, microbiology, ancillary and laboratory) are listed below for reference.    Significant Diagnostic Studies: Dg Chest 2 View  05/07/2015  CLINICAL DATA:  Mid back pain since Wednesday. New dizziness was shortness of breath and some nausea last night. EXAM: CHEST  2 VIEW COMPARISON:  Chest CT performed earlier same day. FINDINGS: Heart size is upper normal. Overall cardiomediastinal silhouette is within normal limits in size and configuration, with associated age-related ectasia of the thoracic aorta. Mild scarring versus atelectasis at each lung base, better seen on earlier  chest CT. Lungs otherwise clear. No confluent opacity to suggest a developing pneumonia. No pleural effusions seen. No pneumothorax seen. There is a compression fracture deformity of a mid thoracic vertebral body, labeled T7 on earlier CT, of uncertain age but most likely chronic. There is also a mild dextroscoliosis of the lower thoracic spine. Mild degenerative change noted within the thoracic spine. No acute-appearing osseous abnormality. IMPRESSION: Mild scarring versus atelectasis at each lung base, better seen on earlier chest CT. Lungs are otherwise clear and there is no evidence of acute cardiopulmonary abnormality. Compression fracture deformity of the T7 vertebral body, of uncertain age, but most likely old, also better seen on earlier CT. Electronically Signed   By: Franki Cabot M.D.   On:  05/07/2015 12:24   Dg Thoracic Spine 2 View  05/08/2015  CLINICAL DATA:  Sudden onset of back pain while getting into bed. EXAM: THORACIC SPINE 2 VIEWS COMPARISON:  Chest radiograph and chest CT, 05/07/2015 FINDINGS: Moderate compression fracture of T7, unchanged from the previous day's exams. No other thoracic fractures. Fractures of L1 and L2 are can, also stable from the prior CT. No bone lesion. Bones are demineralized. Minor degenerative endplate spurring is noted along the mid and lower thoracic spine Soft tissues are unremarkable. IMPRESSION: 1. Moderate compression fracture of T7, with compression fractures also noted at of L1 and L2, unchanged from the previous day's CT scan. 2. No other fractures.  No lesions.  Minor degenerative changes. Electronically Signed   By: Lajean Manes M.D.   On: 05/08/2015 12:56   Ct Angio Abdomen W/cm &/or Wo Contrast  05/07/2015  CLINICAL DATA:  82 year old female with mid back pain, hypertension and nausea/vomiting EXAM: CT ANGIOGRAPHY CHEST, ABDOMEN AND PELVIS TECHNIQUE: Multidetector CT imaging through the chest, abdomen and pelvis was performed using the standard protocol during bolus administration of intravenous contrast. Multiplanar reconstructed images and MIPs were obtained and reviewed to evaluate the vascular anatomy. CONTRAST:  9mL OMNIPAQUE IOHEXOL 350 MG/ML SOLN COMPARISON:  Prior MRI lumbar spine 04/18/2012 FINDINGS: CTA CHEST FINDINGS Mediastinum: Unremarkable CT appearance of the thyroid gland. No suspicious mediastinal or hilar adenopathy. No soft tissue mediastinal mass. The thoracic esophagus is unremarkable. Heart/Vascular: No evidence of acute intramural hematoma on the initial non contrasted images. Following the administration of intravenous contrast. There is a conventional 3 vessel arch morphology. No evidence of aneurysm or acute dissection. Mild atherosclerotic vascular calcifications without significant stenosis. Calcifications present along  the course of the left anterior descending, circumflex and right coronary arteries. Concentric hypertrophy of the left ventricle. The heart is at the upper limits of normal for size. No pericardial effusion. Normal caliber main and central pulmonary arteries. No evidence of acute pulmonary embolus. Lungs/Pleura: 5-6 mm nodule in the superior aspect of the right middle lobe (image 27 series 5). Adjacent 3 mm ground-glass attenuation nodule on the same image. 4 mm nodule slightly more inferior on image 31. Dependent atelectasis in both lower lobes. 3 mm ground-glass attenuation nodular opacity in the left lung apex on image 7 of series 5. Probable 5 mm pulmonary nodule in the left lower lobe (image 45 series 5). No evidence of focal airspace consolidation, pulmonary edema, pleural effusion or pneumothorax. Bones/Soft Tissues: No acute fracture or aggressive appearing lytic or blastic osseous lesion. T7 compression fracture with approximately 50% height loss anteriorly. Review of the MIP images confirms the above findings. CTA ABDOMEN AND PELVIS FINDINGS VASCULAR Aorta: Tortuous but normal caliber abdominal aorta. No evidence of acute  dissection. Scattered calcified atherosclerotic plaque. No significant or irregular mural thrombus. Celiac: Variant anatomy. The splenic and left gastric artery arise from a common trunk. The proper hepatic artery arises independently from the aorta. Atherosclerotic plaque at the origin of both vessels results in at least mild narrowing. No visceral artery aneurysm. SMA: Widely patent and unremarkable. Renals: Single dominant renal arteries bilaterally which are widely patent without evidence of fibromuscular dysplasia. Accessory renal artery to the lower pole of the left kidney. IMA: Atherosclerotic plaque results in at least mild narrowing of the proximal vessel. The distal vessel remains patent. Inflow: Tortuous and mildly ectatic but no aneurysm, dissection or high-grade stenosis.  Proximal Outflow: Visualized portions are relatively disease free. Veins: No focal venous abnormality. NON-VASCULAR Abdomen: Moderate respiratory motion artifact in the upper abdomen. Unremarkable CT appearance of the stomach, duodenum, spleen, adrenal glands and pancreas. Normal hepatic contour and morphology. No discrete hepatic lesion. Gallbladder is unremarkable. No intra or extrahepatic biliary ductal dilatation. Unremarkable appearance of the bilateral kidneys. No focal solid lesion, hydronephrosis or nephrolithiasis. Colonic diverticular disease without CT evidence of active inflammation. The colon is decompressed. No focal bowel wall thickening or evidence of obstruction. No free fluid or suspicious adenopathy. Pelvis: The uterus is surgically absent. The bladder is dilated with urine. No free fluid or suspicious adenopathy. Bones/Soft Tissues: The bones appear diffusely demineralized. There is no evidence of acute fracture, malalignment or aggressive lytic or blastic osseous lesion. Advanced multilevel degenerative disc disease. Lower lumbar facet arthropathy. L1 and L2 compression fractures without significant interval change compared to prior MRI. Review of the MIP images confirms the above findings. IMPRESSION: CTA CHEST 1. No acute dissection, aneurysm or other evidence of acute aortic syndrome. 2. Atherosclerotic vascular calcifications including 3 vessel coronary artery calcifications. 3. Concentric hypertrophy of the left ventricle. Does the patient have a clinical history of systemic arterial hypertension? 4. Multiple pulmonary nodules measuring up to 5 mm. While these are highly likely (in the absence of a known primary malignancy. If the patient has a known primary malignancy then metastatic disease would be more likely) the sequelae of a prior infectious/inflammatory or granulomatous process, stability cannot be confirmed without prior imaging for comparison. Consider follow-up CT scan of the  chest in 6-12 months if the documentation of stability is clinically warranted. 5. Age indeterminate (without prior imaging for comparison) but likely chronic T7 compression fracture. CTA ABD/PELVIS 1. Moderate respiratory/patient motion artifact. 2. No evidence of acute aortic dissection, aneurysm or other acute vascular abnormality. 3. Incidental note is made of variant anatomy of the celiac artery origin. 4. Atherosclerotic vascular calcifications without significant stenosis to suggest a source for chronic mesenteric ischemia. 5. Colonic diverticular disease without CT evidence of active inflammation. 6. Chronic L1 and L2 compression fractures without evidence of progressive height loss compared to 04/18/2012. Signed, Criselda Peaches, MD [facility] Electronically Signed   By: Jacqulynn Cadet M.D.   On: 05/07/2015 11:40   Dg Esophagus  05/09/2015  CLINICAL DATA:  Frequent eructation with liquid intake, globus sensation, and reported feeling of food getting "stuck" in her chest. She does have delayed coughing throughout intake, although vocal quality remains clear. Suspect that this is related to possible esophageal issues, although also cannot rule out backflow into the pharynx / larynx post-swallow EXAM: ESOPHOGRAM/BARIUM SWALLOW TECHNIQUE: Single contrast examination was performed using  thin barium. FLUOROSCOPY TIME:  Radiation Exposure Index (as provided by the fluoroscopic device): If the device does  not provide the exposure index: Fluoroscopy Time:  36 secs Number of Acquired Images:  3 COMPARISON:  CT chest 05/07/2015 FINDINGS: The first swallow of barium passed quickly into the stomach. Subsequent sips remain static within the esophagus. There was to and fro motion of the barium bolus into the high thoracic esophagus. There was little peristaltic wave action and no tertiary contractions. No retained food stuff within the esophagus. No  esophageal dilatation. IMPRESSION: 1. Significant esophageal dysmotility with stasis of the barium bolus within the esophagus and to and fro motion in the high cervical esophagus. No tertiary contractions. 2. No evidence obstruction or retained food stuff. Electronically Signed   By: Suzy Bouchard M.D.   On: 05/09/2015 09:19   Ct Angio Chest Aorta W/cm &/or Wo/cm  05/07/2015  CLINICAL DATA:  82 year old female with mid back pain, hypertension and nausea/vomiting EXAM: CT ANGIOGRAPHY CHEST, ABDOMEN AND PELVIS TECHNIQUE: Multidetector CT imaging through the chest, abdomen and pelvis was performed using the standard protocol during bolus administration of intravenous contrast. Multiplanar reconstructed images and MIPs were obtained and reviewed to evaluate the vascular anatomy. CONTRAST:  85mL OMNIPAQUE IOHEXOL 350 MG/ML SOLN COMPARISON:  Prior MRI lumbar spine 04/18/2012 FINDINGS: CTA CHEST FINDINGS Mediastinum: Unremarkable CT appearance of the thyroid gland. No suspicious mediastinal or hilar adenopathy. No soft tissue mediastinal mass. The thoracic esophagus is unremarkable. Heart/Vascular: No evidence of acute intramural hematoma on the initial non contrasted images. Following the administration of intravenous contrast. There is a conventional 3 vessel arch morphology. No evidence of aneurysm or acute dissection. Mild atherosclerotic vascular calcifications without significant stenosis. Calcifications present along the course of the left anterior descending, circumflex and right coronary arteries. Concentric hypertrophy of the left ventricle. The heart is at the upper limits of normal for size. No pericardial effusion. Normal caliber main and central pulmonary arteries. No evidence of acute pulmonary embolus. Lungs/Pleura: 5-6 mm nodule in the superior aspect of the right middle lobe (image 27 series 5). Adjacent 3 mm ground-glass attenuation nodule on the same image. 4 mm nodule slightly more inferior on  image 31. Dependent atelectasis in both lower lobes. 3 mm ground-glass attenuation nodular opacity in the left lung apex on image 7 of series 5. Probable 5 mm pulmonary nodule in the left lower lobe (image 45 series 5). No evidence of focal airspace consolidation, pulmonary edema, pleural effusion or pneumothorax. Bones/Soft Tissues: No acute fracture or aggressive appearing lytic or blastic osseous lesion. T7 compression fracture with approximately 50% height loss anteriorly. Review of the MIP images confirms the above findings. CTA ABDOMEN AND PELVIS FINDINGS VASCULAR Aorta: Tortuous but normal caliber abdominal aorta. No evidence of acute dissection. Scattered calcified atherosclerotic plaque. No significant or irregular mural thrombus. Celiac: Variant anatomy. The splenic and left gastric artery arise from a common trunk. The proper hepatic artery arises independently from the aorta. Atherosclerotic plaque at the origin of both vessels results in at least mild narrowing. No visceral artery aneurysm. SMA: Widely patent and unremarkable. Renals: Single dominant renal arteries bilaterally which are widely patent without evidence of fibromuscular dysplasia. Accessory renal artery to the lower pole of the left kidney. IMA: Atherosclerotic plaque results in at least mild narrowing of the proximal vessel. The distal vessel remains patent. Inflow: Tortuous and mildly ectatic but no aneurysm, dissection or high-grade stenosis. Proximal Outflow: Visualized portions are relatively disease free. Veins: No focal venous abnormality. NON-VASCULAR Abdomen: Moderate respiratory motion artifact in the upper abdomen. Unremarkable CT appearance of the stomach, duodenum,  spleen, adrenal glands and pancreas. Normal hepatic contour and morphology. No discrete hepatic lesion. Gallbladder is unremarkable. No intra or extrahepatic biliary ductal dilatation. Unremarkable appearance of the bilateral kidneys. No focal solid lesion,  hydronephrosis or nephrolithiasis. Colonic diverticular disease without CT evidence of active inflammation. The colon is decompressed. No focal bowel wall thickening or evidence of obstruction. No free fluid or suspicious adenopathy. Pelvis: The uterus is surgically absent. The bladder is dilated with urine. No free fluid or suspicious adenopathy. Bones/Soft Tissues: The bones appear diffusely demineralized. There is no evidence of acute fracture, malalignment or aggressive lytic or blastic osseous lesion. Advanced multilevel degenerative disc disease. Lower lumbar facet arthropathy. L1 and L2 compression fractures without significant interval change compared to prior MRI. Review of the MIP images confirms the above findings. IMPRESSION: CTA CHEST 1. No acute dissection, aneurysm or other evidence of acute aortic syndrome. 2. Atherosclerotic vascular calcifications including 3 vessel coronary artery calcifications. 3. Concentric hypertrophy of the left ventricle. Does the patient have a clinical history of systemic arterial hypertension? 4. Multiple pulmonary nodules measuring up to 5 mm. While these are highly likely (in the absence of a known primary malignancy. If the patient has a known primary malignancy then metastatic disease would be more likely) the sequelae of a prior infectious/inflammatory or granulomatous process, stability cannot be confirmed without prior imaging for comparison. Consider follow-up CT scan of the chest in 6-12 months if the documentation of stability is clinically warranted. 5. Age indeterminate (without prior imaging for comparison) but likely chronic T7 compression fracture. CTA ABD/PELVIS 1. Moderate respiratory/patient motion artifact. 2. No evidence of acute aortic dissection, aneurysm or other acute vascular abnormality. 3. Incidental note is made of variant anatomy of the celiac artery origin. 4. Atherosclerotic vascular calcifications without significant stenosis to suggest a  source for chronic mesenteric ischemia. 5. Colonic diverticular disease without CT evidence of active inflammation. 6. Chronic L1 and L2 compression fractures without evidence of progressive height loss compared to 04/18/2012. Signed, Criselda Peaches, MD [facility] Electronically Signed   By: Jacqulynn Cadet M.D.   On: 05/07/2015 11:40    Microbiology: No results found for this or any previous visit (from the past 240 hour(s)).   Labs: Basic Metabolic Panel:  Recent Labs Lab 05/09/15 0538 05/09/15 1545 05/10/15 0415 05/11/15 0434 05/12/15 0428  NA 120* 121* 123* 128* 130*  K 3.4* 4.0 4.4 3.9 3.8  CL 93* 90* 93* 99* 99*  CO2 20* 21* 23 23 23   GLUCOSE 109* 124* 107* 104* 103*  BUN 6 6 5* 8 10  CREATININE 0.58 0.63 0.58 0.55 0.61  CALCIUM 8.0* 8.9 8.7* 8.7* 8.7*   Liver Function Tests:  Recent Labs Lab 05/07/15 0945  AST 21  ALT 15  ALKPHOS 71  BILITOT 0.9  PROT 6.9  ALBUMIN 3.4*    Recent Labs Lab 05/07/15 0945  LIPASE 22   No results for input(s): AMMONIA in the last 168 hours. CBC:  Recent Labs Lab 05/08/15 0534 05/09/15 0538 05/10/15 0415 05/11/15 0434 05/12/15 0428  WBC 11.3* 12.4* 11.0* 9.3 9.2  HGB 12.0 12.6 12.3 11.8* 11.9*  HCT 35.3* 36.1 36.8 34.8* 34.1*  MCV 80.8 79.7 80.3 79.6 79.7  PLT 384 415* 429* 416* 443*   Cardiac Enzymes: No results for input(s): CKTOTAL, CKMB, CKMBINDEX, TROPONINI in the last 168 hours. BNP: BNP (last 3 results) No results for input(s): BNP in the last 8760 hours.  ProBNP (last 3  results) No results for input(s): PROBNP in the last 8760 hours.  CBG: No results for input(s): GLUCAP in the last 168 hours.     SignedCristal Ford  [facility] 05/12/2015, 10:37 AM

## 2015-05-12 NOTE — Progress Notes (Signed)
Physical Therapy Treatment Patient Details Name: Cathy Silva MRN: [id number] DOB: 1929-07-19 Today's Date: 05/12/2015    History of Present Illness Pt admitted with back pain. Acute onset. Started 5 days ago this patient was getting into her bed. Mid back without radiation. Denies any loss of function in her lower extremities or loss of bowel or bladder function. Fairly constant with waxing and waning nature. Worse with certain movements. Denies any dysuria or frequency. Denies fevers. Associate with nausea. Diarrhea 1 one day ago. Vomiting 1 in ED that was nonbilious non-bloody. 200 mg of ibuprofen daily without improvement. 400 mg of ibuprofen 1 on day of admission with some improvement. MRI revealed T7, L1 & L2 compression fractures.    PT Comments    Patient tolerating ambulation and stair negotiation with HR max 121 and no overt c/o fatigue.  Safe use of walker in controlled environment.  HHPT to educate further on home environment.  Feel can be safe at home with intermittent supervision, though would need assist with IADL's like meal prep, groceries, and potentially even with shower/tub transfers initially.    Follow Up Recommendations  Home health PT;Supervision - Intermittent     Equipment Recommendations  Rolling walker with 5" wheels    Recommendations for Other Services       Precautions / Restrictions Precautions Precautions: Fall Restrictions Weight Bearing Restrictions: No    Mobility  Bed Mobility Overal bed mobility: Modified Independent             General bed mobility comments: with supine to sit with HOB elevated  Transfers   Equipment used: Rolling walker (2 wheeled) Transfers: Sit to/from Stand Sit to Stand: Supervision         General transfer comment: for safety  Ambulation/Gait Ambulation/Gait assistance: Supervision Ambulation Distance (Feet): 300 Feet Assistive device: Rolling walker (2 wheeled) Gait Pattern/deviations:  WFL(Within Functional Limits);Decreased stride length     General Gait Details: no LOB   Stairs Stairs: Yes Stairs assistance: Supervision Stair Management: One rail Left;Step to pattern;Sideways Number of Stairs: 4 General stair comments: cues for technique; also educated on technique on one step without railing as has this at her home entry, educated to use walker and step up holding on.  Wheelchair Mobility    Modified Rankin (Stroke Patients Only)       Balance     Sitting balance-Leahy Scale: Good       Standing balance-Leahy Scale: Fair                      Cognition Arousal/Alertness: Awake/alert Behavior During Therapy: WFL for tasks assessed/performed Overall Cognitive Status: Within Functional Limits for tasks assessed                      Exercises      General Comments        Pertinent Vitals/Pain Pain Assessment: No/denies pain    Home Living                      Prior Function            PT Goals (current goals can now be found in the care plan section) Progress towards PT goals: Progressing toward goals    Frequency  Min 3X/week    PT Plan Current plan remains appropriate    Co-evaluation             End of Session Equipment Utilized During  Treatment: Gait belt Activity Tolerance: Patient tolerated treatment well Patient left: in chair;with call bell/phone within reach     Time: 0915-0940 PT Time Calculation (min) (ACUTE ONLY): 25 min  Charges:  $Gait Training: 23-37 mins                    G Codes:      Reginia Naas June 01, 2015, 10:34 AM  Magda Kiel, [place] 01-Jun-2015

## 2015-05-12 NOTE — Care Management Important Message (Signed)
Important Message  Patient Details  Name: Cathy Silva MRN: [id number] Date of Birth: 05-27-1929   Medicare Important Message Given:  Yes    [name] P Zahir Eisenhour 05/12/2015, 3:10 PM

## 2015-05-12 NOTE — Progress Notes (Signed)
Talked to patient again about [place] choices,patient chose [facility]. Butch Penny with [facility] called for arrangements; Aneta Mins [phone]

## 2015-05-13 DIAGNOSIS — M546 Pain in thoracic spine: Secondary | ICD-10-CM | POA: Diagnosis not present

## 2015-05-13 DIAGNOSIS — R1314 Dysphagia, pharyngoesophageal phase: Secondary | ICD-10-CM | POA: Diagnosis not present

## 2015-05-13 DIAGNOSIS — I10 Essential (primary) hypertension: Secondary | ICD-10-CM | POA: Diagnosis not present

## 2015-05-16 DIAGNOSIS — I10 Essential (primary) hypertension: Secondary | ICD-10-CM | POA: Diagnosis not present

## 2015-05-16 DIAGNOSIS — M546 Pain in thoracic spine: Secondary | ICD-10-CM | POA: Diagnosis not present

## 2015-05-16 DIAGNOSIS — R1314 Dysphagia, pharyngoesophageal phase: Secondary | ICD-10-CM | POA: Diagnosis not present

## 2015-05-17 DIAGNOSIS — I10 Essential (primary) hypertension: Secondary | ICD-10-CM | POA: Diagnosis not present

## 2015-05-17 DIAGNOSIS — R1314 Dysphagia, pharyngoesophageal phase: Secondary | ICD-10-CM | POA: Diagnosis not present

## 2015-05-17 DIAGNOSIS — M546 Pain in thoracic spine: Secondary | ICD-10-CM | POA: Diagnosis not present

## 2015-05-22 DIAGNOSIS — R1314 Dysphagia, pharyngoesophageal phase: Secondary | ICD-10-CM | POA: Diagnosis not present

## 2015-05-22 DIAGNOSIS — M546 Pain in thoracic spine: Secondary | ICD-10-CM | POA: Diagnosis not present

## 2015-05-22 DIAGNOSIS — I10 Essential (primary) hypertension: Secondary | ICD-10-CM | POA: Diagnosis not present

## 2015-05-23 DIAGNOSIS — S22069A Unspecified fracture of T7-T8 vertebra, initial encounter for closed fracture: Secondary | ICD-10-CM | POA: Diagnosis not present

## 2015-05-23 DIAGNOSIS — E039 Hypothyroidism, unspecified: Secondary | ICD-10-CM | POA: Diagnosis not present

## 2015-05-23 DIAGNOSIS — E871 Hypo-osmolality and hyponatremia: Secondary | ICD-10-CM | POA: Diagnosis not present

## 2015-05-23 DIAGNOSIS — I10 Essential (primary) hypertension: Secondary | ICD-10-CM | POA: Diagnosis not present

## 2015-05-25 DIAGNOSIS — M546 Pain in thoracic spine: Secondary | ICD-10-CM | POA: Diagnosis not present

## 2015-05-25 DIAGNOSIS — I10 Essential (primary) hypertension: Secondary | ICD-10-CM | POA: Diagnosis not present

## 2015-05-25 DIAGNOSIS — R1314 Dysphagia, pharyngoesophageal phase: Secondary | ICD-10-CM | POA: Diagnosis not present

## 2015-05-29 DIAGNOSIS — I10 Essential (primary) hypertension: Secondary | ICD-10-CM | POA: Diagnosis not present

## 2015-05-29 DIAGNOSIS — M546 Pain in thoracic spine: Secondary | ICD-10-CM | POA: Diagnosis not present

## 2015-05-29 DIAGNOSIS — R1314 Dysphagia, pharyngoesophageal phase: Secondary | ICD-10-CM | POA: Diagnosis not present

## 2015-06-01 DIAGNOSIS — I10 Essential (primary) hypertension: Secondary | ICD-10-CM | POA: Diagnosis not present

## 2015-06-01 DIAGNOSIS — R1314 Dysphagia, pharyngoesophageal phase: Secondary | ICD-10-CM | POA: Diagnosis not present

## 2015-06-01 DIAGNOSIS — M546 Pain in thoracic spine: Secondary | ICD-10-CM | POA: Diagnosis not present

## 2015-06-07 DIAGNOSIS — R1314 Dysphagia, pharyngoesophageal phase: Secondary | ICD-10-CM | POA: Diagnosis not present

## 2015-06-07 DIAGNOSIS — M546 Pain in thoracic spine: Secondary | ICD-10-CM | POA: Diagnosis not present

## 2015-06-07 DIAGNOSIS — I10 Essential (primary) hypertension: Secondary | ICD-10-CM | POA: Diagnosis not present

## 2015-06-08 DIAGNOSIS — S32020A Wedge compression fracture of second lumbar vertebra, initial encounter for closed fracture: Secondary | ICD-10-CM | POA: Diagnosis not present

## 2015-06-08 DIAGNOSIS — S22060A Wedge compression fracture of T7-T8 vertebra, initial encounter for closed fracture: Secondary | ICD-10-CM | POA: Diagnosis not present

## 2015-06-08 DIAGNOSIS — S32010A Wedge compression fracture of first lumbar vertebra, initial encounter for closed fracture: Secondary | ICD-10-CM | POA: Diagnosis not present

## 2015-06-09 DIAGNOSIS — M4850XA Collapsed vertebra, not elsewhere classified, site unspecified, initial encounter for fracture: Secondary | ICD-10-CM | POA: Diagnosis not present

## 2015-06-09 DIAGNOSIS — I10 Essential (primary) hypertension: Secondary | ICD-10-CM | POA: Diagnosis not present

## 2015-06-09 DIAGNOSIS — R1314 Dysphagia, pharyngoesophageal phase: Secondary | ICD-10-CM | POA: Diagnosis not present

## 2015-06-09 DIAGNOSIS — M546 Pain in thoracic spine: Secondary | ICD-10-CM | POA: Diagnosis not present

## 2015-06-09 DIAGNOSIS — E039 Hypothyroidism, unspecified: Secondary | ICD-10-CM | POA: Diagnosis not present

## 2015-06-09 DIAGNOSIS — M8580 Other specified disorders of bone density and structure, unspecified site: Secondary | ICD-10-CM | POA: Diagnosis not present

## 2015-06-09 DIAGNOSIS — E871 Hypo-osmolality and hyponatremia: Secondary | ICD-10-CM | POA: Diagnosis not present

## 2015-06-13 DIAGNOSIS — R1314 Dysphagia, pharyngoesophageal phase: Secondary | ICD-10-CM | POA: Diagnosis not present

## 2015-06-13 DIAGNOSIS — I10 Essential (primary) hypertension: Secondary | ICD-10-CM | POA: Diagnosis not present

## 2015-06-13 DIAGNOSIS — M546 Pain in thoracic spine: Secondary | ICD-10-CM | POA: Diagnosis not present

## 2015-06-15 DIAGNOSIS — M546 Pain in thoracic spine: Secondary | ICD-10-CM | POA: Diagnosis not present

## 2015-06-15 DIAGNOSIS — R1314 Dysphagia, pharyngoesophageal phase: Secondary | ICD-10-CM | POA: Diagnosis not present

## 2015-06-15 DIAGNOSIS — I10 Essential (primary) hypertension: Secondary | ICD-10-CM | POA: Diagnosis not present

## 2015-06-20 DIAGNOSIS — I10 Essential (primary) hypertension: Secondary | ICD-10-CM | POA: Diagnosis not present

## 2015-06-20 DIAGNOSIS — R1314 Dysphagia, pharyngoesophageal phase: Secondary | ICD-10-CM | POA: Diagnosis not present

## 2015-06-20 DIAGNOSIS — M546 Pain in thoracic spine: Secondary | ICD-10-CM | POA: Diagnosis not present

## 2015-06-21 DIAGNOSIS — M85851 Other specified disorders of bone density and structure, right thigh: Secondary | ICD-10-CM | POA: Diagnosis not present

## 2015-06-21 DIAGNOSIS — M810 Age-related osteoporosis without current pathological fracture: Secondary | ICD-10-CM | POA: Diagnosis not present

## 2015-06-22 DIAGNOSIS — R1314 Dysphagia, pharyngoesophageal phase: Secondary | ICD-10-CM | POA: Diagnosis not present

## 2015-06-22 DIAGNOSIS — I10 Essential (primary) hypertension: Secondary | ICD-10-CM | POA: Diagnosis not present

## 2015-06-22 DIAGNOSIS — M546 Pain in thoracic spine: Secondary | ICD-10-CM | POA: Diagnosis not present

## 2015-06-26 DIAGNOSIS — M546 Pain in thoracic spine: Secondary | ICD-10-CM | POA: Diagnosis not present

## 2015-06-26 DIAGNOSIS — R1314 Dysphagia, pharyngoesophageal phase: Secondary | ICD-10-CM | POA: Diagnosis not present

## 2015-06-26 DIAGNOSIS — I10 Essential (primary) hypertension: Secondary | ICD-10-CM | POA: Diagnosis not present

## 2015-06-29 DIAGNOSIS — R1314 Dysphagia, pharyngoesophageal phase: Secondary | ICD-10-CM | POA: Diagnosis not present

## 2015-06-29 DIAGNOSIS — I10 Essential (primary) hypertension: Secondary | ICD-10-CM | POA: Diagnosis not present

## 2015-06-29 DIAGNOSIS — M546 Pain in thoracic spine: Secondary | ICD-10-CM | POA: Diagnosis not present

## 2015-06-30 ENCOUNTER — Telehealth: Payer: Self-pay

## 2015-06-30 NOTE — Telephone Encounter (Signed)
Called patient to give her BMD results. Pt notified.

## 2015-07-03 DIAGNOSIS — R1314 Dysphagia, pharyngoesophageal phase: Secondary | ICD-10-CM | POA: Diagnosis not present

## 2015-07-03 DIAGNOSIS — I10 Essential (primary) hypertension: Secondary | ICD-10-CM | POA: Diagnosis not present

## 2015-07-03 DIAGNOSIS — M546 Pain in thoracic spine: Secondary | ICD-10-CM | POA: Diagnosis not present

## 2015-07-05 DIAGNOSIS — R1314 Dysphagia, pharyngoesophageal phase: Secondary | ICD-10-CM | POA: Diagnosis not present

## 2015-07-05 DIAGNOSIS — M546 Pain in thoracic spine: Secondary | ICD-10-CM | POA: Diagnosis not present

## 2015-07-05 DIAGNOSIS — I10 Essential (primary) hypertension: Secondary | ICD-10-CM | POA: Diagnosis not present

## 2015-07-25 ENCOUNTER — Ambulatory Visit (INDEPENDENT_AMBULATORY_CARE_PROVIDER_SITE_OTHER): Payer: Medicare Other | Admitting: Certified Nurse Midwife

## 2015-07-25 ENCOUNTER — Encounter: Payer: Self-pay | Admitting: Certified Nurse Midwife

## 2015-07-25 VITALS — BP 120/70 | HR 74 | Resp 16 | Ht 63.75 in | Wt 147.0 lb

## 2015-07-25 DIAGNOSIS — Z124 Encounter for screening for malignant neoplasm of cervix: Secondary | ICD-10-CM

## 2015-07-25 DIAGNOSIS — Z01419 Encounter for gynecological examination (general) (routine) without abnormal findings: Secondary | ICD-10-CM | POA: Diagnosis not present

## 2015-07-25 DIAGNOSIS — N952 Postmenopausal atrophic vaginitis: Secondary | ICD-10-CM | POA: Diagnosis not present

## 2015-07-25 NOTE — Progress Notes (Signed)
82 y.o. G0P0000 Widowed  Caucasian Fe here for annual exam. Menopausal no vaginal bleeding or vaginal dryness. Patient had cataracts bilateral in 12/16 and is having much clearer vision. Just renewed Driver licensed! Was hospitalized for low sodium in 1/17 which is now under control. Sees Dr. Micheline Rough for aex/labs and medication management. Had unknown fracture in lower spine and had PT for. BMD showed osteopenia and PCP has advised Boniva or Fosamax. Patient unsure if she wants to proceed with use. Ambulating with problems. Appetite good. No UTI's in the past year. No other health issues today.  Patient's last menstrual period was 05/31/1987.          Sexually active: No.  The current method of family planning is status post hysterectomy.    Exercising: No.  exercise Smoker:  no  Health Maintenance: Pap:  10-01-07 neg MMG:  08-03-14 category b density,birads 2:neg Colonoscopy:  2010 f/u 73yrs BMD:   2017 TDaP:  1982 pt has reactions Shingles: no Pneumonia: had reaction to 2nd one Hep C and HIV: not done Labs: pcp Self breast exam: not done   reports that she has never smoked. She does not have any smokeless tobacco history on file. She reports that she does not drink alcohol or use illicit drugs.  Past Medical History  Diagnosis Date  . Melanoma in situ ([place]) 2009  . BCC (basal cell carcinoma), arm 2008    Left   . Fracture     in back    Past Surgical History  Procedure Laterality Date  . Total abdominal hysterectomy w/ bilateral salpingoophorectomy  05/1987    Leiomyoma  . Dilation and curettage of uterus  05/1984    Prolif  . Dilation and curettage of uterus  06/1986    benign  . Eye surgery      bilat cataract  . Tonsillectomy    . Esophagogastroduodenoscopy Left 05/11/2015    Procedure: ESOPHAGOGASTRODUODENOSCOPY (EGD);  Surgeon: Carol Ada, MD;  Location: [facility] ENDOSCOPY;  Service: Endoscopy;  Laterality: Left;  . Balloon dilation N/A 05/11/2015    Procedure: BALLOON  DILATION;  Surgeon: Carol Ada, MD;  Location: [facility] ENDOSCOPY;  Service: Endoscopy;  Laterality: N/A;    Current Outpatient Prescriptions  Medication Sig Dispense Refill  . Acetaminophen (TYLENOL PO) Take by mouth as needed.    Marland Kitchen amLODipine (NORVASC) 5 MG tablet Take 1 tablet (5 mg total) by mouth daily. 30 tablet 0  . Ascorbic Acid (VITAMIN C) 1000 MG tablet Take 1,000 mg by mouth daily.    . Calcium Carbonate-Vitamin D (CALCIUM + D PO) Take 1,200 mg by mouth.    . feeding supplement (BOOST / RESOURCE BREEZE) LIQD Take 1 Container by mouth 3 (three) times daily between meals.  0  . Loratadine (CLARITIN PO) Take 10 mg by mouth daily as needed. For allergies    . Multiple Vitamins-Minerals (MULTIVITAMIN PO) Take by mouth.    . Omega-3 Fatty Acids (FISH OIL) 1200 MG CAPS Take by mouth.    . pantoprazole (PROTONIX) 40 MG tablet Take 1 tablet (40 mg total) by mouth 2 (two) times daily. (Patient taking differently: Take 40 mg by mouth daily. ) 60 tablet 0  . traMADol (ULTRAM) 50 MG tablet Take 1 tablet (50 mg total) by mouth every 6 (six) hours as needed for moderate pain (or Headache unrelieved by tylenol). (Patient not taking: Reported on 07/25/2015) 30 tablet 0   No current facility-administered medications for this visit.    Family History  Problem Relation Age of Onset  . Heart attack Maternal Grandmother     ROS:  Pertinent items are noted in HPI.  Otherwise, a comprehensive ROS was negative.  Exam:   BP 120/70 mmHg  Pulse 74  Resp 16  Ht 5' 3.75" (1.619 m)  Wt 147 lb (66.679 kg)  BMI 25.44 kg/m2  LMP 05/31/1987 Height: 5' 3.75" (161.9 cm) Ht Readings from Last 3 Encounters:  07/25/15 5' 3.75" (1.619 m)  05/07/15 5\' 6"  (1.676 m)  07/20/14 5' 4.5" (1.638 m)    General appearance: alert, cooperative and appears stated age Head: Normocephalic, without obvious abnormality, atraumatic Neck: no adenopathy, supple, symmetrical, trachea midline and thyroid normal to inspection and  palpation Lungs: clear to auscultation bilaterally Breasts: normal appearance, no masses or tenderness, No nipple retraction or dimpling, No nipple discharge or bleeding, No axillary or supraclavicular adenopathy Heart: regular rate and rhythm Abdomen: soft, non-tender; no masses,  no organomegaly Extremities: extremities normal, atraumatic, no cyanosis or edema Skin: Skin color, texture, turgor normal. No rashes or lesions Lymph nodes: Cervical, supraclavicular, and axillary nodes normal. No abnormal inguinal nodes palpated Neurologic: Grossly normal   Pelvic: External genitalia:  no lesions, normal atrophic female              Urethra:  normal appearing urethra with no masses, tenderness or lesions              Bartholin's and Skene's: normal                 Vagina: normal appearing vagina with normal color and discharge, no lesions              Cervix: absent              Pap taken: No. Bimanual Exam:  Uterus:  uterus absent              Adnexa: no mass, fullness, tenderness               Rectovaginal: Confirms               Anus:  normal sphincter tone, no lesions  Chaperone present: yes  A:  Well Woman with normal exam  Menopausal no HRT, S/P TAH with BSO for fibroids  Osteopenia considering Bisphosphates with PCP management  Recent hospitalization for low sodium with EGD with change in appearance with swallowing  Lumbar unknown fracture completed PT, ambulating well  PCP management of Hypertension      P:   Reviewed health and wellness pertinent to exam  Aware of need to advise if vaginal bleeding  Discussed with patient oral medication use concern with recent swallowing issues. Feel she should discuss with PCP so she can feel secure in use.  Continue follow up with PCP as indicated  Pap smear as above not taken   counseled on breast self exam, mammography screening, adequate intake of calcium and vitamin D, diet and exercise, Kegel's exercises  return annually or  prn  An After Visit Summary was printed and given to the patient.

## 2015-07-25 NOTE — Patient Instructions (Signed)
EXERCISE AND DIET:  We recommended that you start or continue a regular exercise program for good health. Regular exercise means any activity that makes your heart beat faster and makes you sweat.  We recommend exercising at least 30 minutes per day at least 3 days a week, preferably 4 or 5.  We also recommend a diet low in fat and sugar.  Inactivity, poor dietary choices and obesity can cause diabetes, heart attack, stroke, and kidney damage, among others.    ALCOHOL AND SMOKING:  Women should limit their alcohol intake to no more than 7 drinks/beers/glasses of wine (combined, not each!) per week. Moderation of alcohol intake to this level decreases your risk of breast cancer and liver damage. And of course, no recreational drugs are part of a healthy lifestyle.  And absolutely no smoking or even second hand smoke. Most people know smoking can cause heart and lung diseases, but did you know it also contributes to weakening of your bones? Aging of your skin?  Yellowing of your teeth and nails?  CALCIUM AND VITAMIN D:  Adequate intake of calcium and Vitamin D are recommended.  The recommendations for exact amounts of these supplements seem to change often, but generally speaking 600 mg of calcium (either carbonate or citrate) and 800 units of Vitamin D per day seems prudent. Certain women may benefit from higher intake of Vitamin D.  If you are among these women, your doctor will have told you during your visit.    PAP SMEARS:  Pap smears, to check for cervical cancer or precancers,  have traditionally been done yearly, although recent scientific advances have shown that most women can have pap smears less often.  However, every woman still should have a physical exam from her gynecologist every year. It will include a breast check, inspection of the vulva and vagina to check for abnormal growths or skin changes, a visual exam of the cervix, and then an exam to evaluate the size and shape of the uterus and  ovaries.  And after 80 years of age, a rectal exam is indicated to check for rectal cancers. We will also provide age appropriate advice regarding health maintenance, like when you should have certain vaccines, screening for sexually transmitted diseases, bone density testing, colonoscopy, mammograms, etc.   MAMMOGRAMS:  All women over 40 years old should have a yearly mammogram. Many facilities now offer a "3D" mammogram, which may cost around $50 extra out of pocket. If possible,  we recommend you accept the option to have the 3D mammogram performed.  It both reduces the number of women who will be called back for extra views which then turn out to be normal, and it is better than the routine mammogram at detecting truly abnormal areas.    COLONOSCOPY:  Colonoscopy to screen for colon cancer is recommended for all women at age 50.  We know, you hate the idea of the prep.  We agree, BUT, having colon cancer and not knowing it is worse!!  Colon cancer so often starts as a polyp that can be seen and removed at colonscopy, which can quite literally save your life!  And if your first colonoscopy is normal and you have no family history of colon cancer, most women don't have to have it again for 10 years.  Once every ten years, you can do something that may end up saving your life, right?  We will be happy to help you get it scheduled when you are ready.    Be sure to check your insurance coverage so you understand how much it will cost.  It may be covered as a preventative service at no cost, but you should check your particular policy.     Osteoporosis Osteoporosis happens when your bones become thinner and weaker. Weak bones can break (fracture) more easily when you slip or fall. Bones most at risk of breaking are in the hip, wrist, and spine. HOME CARE  Get enough calcium and vitamin D. These nutrients are good for your bones.  Exercise as told by your doctor.  Do not use any tobacco products. This  includes cigarettes, chewing tobacco, and electronic cigarettes. If you need help quitting, ask your doctor.  Limit the amount of alcohol you drink.  Take medicines only as told by your doctor.  Keep all follow-up visits as told by your doctor. This is important.  Take care at home to prevent falls. Some ways to do this are:  Keep rooms well lit and tidy.  Put safety rails on your stairs.  Put a rubber mat in the bathroom and other places that are often wet or slippery. GET HELP RIGHT AWAY IF:  You fall.  You hurt yourself.   This information is not intended to replace advice given to you by your health care provider. Make sure you discuss any questions you have with your health care provider.   Document Released: 07/08/2011 Document Revised: 05/06/2014 Document Reviewed: 09/23/2013 Elsevier Interactive Patient Education [employer].

## 2015-07-26 NOTE — Progress Notes (Signed)
Encounter reviewed Jill Jertson, MD   

## 2015-09-06 DIAGNOSIS — M8580 Other specified disorders of bone density and structure, unspecified site: Secondary | ICD-10-CM | POA: Diagnosis not present

## 2015-09-06 DIAGNOSIS — E871 Hypo-osmolality and hyponatremia: Secondary | ICD-10-CM | POA: Diagnosis not present

## 2015-09-06 DIAGNOSIS — I10 Essential (primary) hypertension: Secondary | ICD-10-CM | POA: Diagnosis not present

## 2015-09-15 DIAGNOSIS — E871 Hypo-osmolality and hyponatremia: Secondary | ICD-10-CM | POA: Diagnosis not present

## 2015-10-04 DIAGNOSIS — Z1231 Encounter for screening mammogram for malignant neoplasm of breast: Secondary | ICD-10-CM | POA: Diagnosis not present

## 2015-10-18 ENCOUNTER — Encounter: Payer: Self-pay | Admitting: Certified Nurse Midwife

## 2015-11-01 DIAGNOSIS — E871 Hypo-osmolality and hyponatremia: Secondary | ICD-10-CM | POA: Diagnosis not present

## 2015-12-29 DIAGNOSIS — I951 Orthostatic hypotension: Secondary | ICD-10-CM | POA: Diagnosis not present

## 2015-12-29 DIAGNOSIS — I10 Essential (primary) hypertension: Secondary | ICD-10-CM | POA: Diagnosis not present

## 2015-12-29 DIAGNOSIS — R42 Dizziness and giddiness: Secondary | ICD-10-CM | POA: Diagnosis not present

## 2016-01-05 DIAGNOSIS — E871 Hypo-osmolality and hyponatremia: Secondary | ICD-10-CM | POA: Diagnosis not present

## 2016-01-05 DIAGNOSIS — R42 Dizziness and giddiness: Secondary | ICD-10-CM | POA: Diagnosis not present

## 2016-01-05 DIAGNOSIS — I10 Essential (primary) hypertension: Secondary | ICD-10-CM | POA: Diagnosis not present

## 2016-01-09 DIAGNOSIS — R42 Dizziness and giddiness: Secondary | ICD-10-CM | POA: Diagnosis not present

## 2016-01-09 DIAGNOSIS — H938X3 Other specified disorders of ear, bilateral: Secondary | ICD-10-CM | POA: Diagnosis not present

## 2016-01-09 DIAGNOSIS — H9113 Presbycusis, bilateral: Secondary | ICD-10-CM | POA: Diagnosis not present

## 2016-01-09 DIAGNOSIS — I951 Orthostatic hypotension: Secondary | ICD-10-CM | POA: Diagnosis not present

## 2016-01-15 DIAGNOSIS — H5202 Hypermetropia, left eye: Secondary | ICD-10-CM | POA: Diagnosis not present

## 2016-01-15 DIAGNOSIS — H52223 Regular astigmatism, bilateral: Secondary | ICD-10-CM | POA: Diagnosis not present

## 2016-01-15 DIAGNOSIS — H3530 Unspecified macular degeneration: Secondary | ICD-10-CM | POA: Diagnosis not present

## 2016-01-15 DIAGNOSIS — H524 Presbyopia: Secondary | ICD-10-CM | POA: Diagnosis not present

## 2016-01-15 DIAGNOSIS — Z961 Presence of intraocular lens: Secondary | ICD-10-CM | POA: Diagnosis not present

## 2016-01-15 DIAGNOSIS — Z9849 Cataract extraction status, unspecified eye: Secondary | ICD-10-CM | POA: Diagnosis not present

## 2016-01-19 DIAGNOSIS — D473 Essential (hemorrhagic) thrombocythemia: Secondary | ICD-10-CM | POA: Diagnosis not present

## 2016-01-19 DIAGNOSIS — Z23 Encounter for immunization: Secondary | ICD-10-CM | POA: Diagnosis not present

## 2016-01-25 ENCOUNTER — Encounter: Payer: Self-pay | Admitting: Hematology

## 2016-01-25 ENCOUNTER — Telehealth: Payer: Self-pay | Admitting: Hematology

## 2016-01-25 NOTE — Telephone Encounter (Signed)
Appt scheduled with Kale on 10/23 at 2pm. Pt aware to arrive 30 minutes early. Demographics verified, letter mailed.

## 2016-01-30 DIAGNOSIS — D2271 Melanocytic nevi of right lower limb, including hip: Secondary | ICD-10-CM | POA: Diagnosis not present

## 2016-01-30 DIAGNOSIS — D225 Melanocytic nevi of trunk: Secondary | ICD-10-CM | POA: Diagnosis not present

## 2016-01-30 DIAGNOSIS — D1801 Hemangioma of skin and subcutaneous tissue: Secondary | ICD-10-CM | POA: Diagnosis not present

## 2016-01-30 DIAGNOSIS — L821 Other seborrheic keratosis: Secondary | ICD-10-CM | POA: Diagnosis not present

## 2016-01-30 DIAGNOSIS — D2272 Melanocytic nevi of left lower limb, including hip: Secondary | ICD-10-CM | POA: Diagnosis not present

## 2016-01-30 DIAGNOSIS — L570 Actinic keratosis: Secondary | ICD-10-CM | POA: Diagnosis not present

## 2016-02-05 ENCOUNTER — Ambulatory Visit: Payer: Medicare Other | Attending: Otolaryngology | Admitting: Physical Therapy

## 2016-02-05 VITALS — BP 160/85

## 2016-02-05 DIAGNOSIS — R42 Dizziness and giddiness: Secondary | ICD-10-CM | POA: Insufficient documentation

## 2016-02-05 NOTE — Therapy (Signed)
[facility] [address] [place], [place], [address] Phone: [phone]   Fax:  [phone]  Physical Therapy Evaluation  Patient Details  Name: Cathy Silva MRN: [id number] Date of Birth: Jan 12, 1930 Referring Provider: Dr. Jodi Marble  Encounter Date: 02/05/2016      PT End of Session - 02/05/16 2203    Visit Number 1   Number of Visits 4   Date for PT Re-Evaluation 03/07/16   Authorization Type medicare   Authorization Time Period 02-05-16 - 04-05-16   PT Start Time 1446   PT Stop Time 1530   PT Time Calculation (min) 44 min      Past Medical History:  Diagnosis Date  . BCC (basal cell carcinoma), arm 2008   Left   . Fracture    in back  . Melanoma in situ [facility]) 2009    Past Surgical History:  Procedure Laterality Date  . BALLOON DILATION N/A 05/11/2015   Procedure: BALLOON DILATION;  Surgeon: Carol Ada, MD;  Location: [facility] ENDOSCOPY;  Service: Endoscopy;  Laterality: N/A;  . DILATION AND CURETTAGE OF UTERUS  05/1984   Prolif  . DILATION AND CURETTAGE OF UTERUS  06/1986   benign  . ESOPHAGOGASTRODUODENOSCOPY Left 05/11/2015   Procedure: ESOPHAGOGASTRODUODENOSCOPY (EGD);  Surgeon: Carol Ada, MD;  Location: [facility] ENDOSCOPY;  Service: Endoscopy;  Laterality: Left;  . EYE SURGERY     bilat cataract  . TONSILLECTOMY    . TOTAL ABDOMINAL HYSTERECTOMY W/ BILATERAL SALPINGOOPHORECTOMY  05/1987   Leiomyoma    Vitals:   02/05/16 2218  BP: (!) 160/85         Subjective Assessment - 02/05/16 2152    Subjective Pt reports dizziness started in August; says it is worse in the mornings; was diagnosed with HTN in Dec. 2016 at time of cataract surgery and again with 2nd [place] surgery in Jan. 2017  Pt is taking 25mg  Meclizine 3x/day   Pertinent History cataract surgery in Dec. 2016; states BP rose after this procedure; had 2nd surgery in Jan. 2017 - BP rose again; hypoatremia (ED visit)  with  hospitalization 05-07-15 until 05-12-15   Patient Stated Goals improve dizziness   Currently in Pain? No/denies            [facility] PT Assessment - 02/05/16 1455      Assessment   Medical Diagnosis Vertigo   Referring Provider Dr. Jodi Marble     Precautions   Precautions Other (comment)  Vertigo     Balance Screen   Has the patient fallen in the past 6 months No   Has the patient had a decrease in activity level because of a fear of falling?  No   Is the patient reluctant to leave their home because of a fear of falling?  No     Home Environment   Living Environment Private residence   Living Arrangements Alone   Type of Home Other(Comment)   Home Access Level entry  [place] One level     Prior Function   Level of [place] Retired            Vestibular Assessment - 02/05/16 1519      Vestibular Assessment   General Observation pt is an 82 year old lady with onset of vertigo that started approx. 2 months ago - is unable to give accurate description of dizziness     Symptom Behavior   Type of Dizziness  Comment  dizziness   Frequency of Dizziness varies   Duration of Dizziness seconds   Aggravating Factors Forward bending;Looking up to the ceiling   Relieving Factors Lying supine     Positional Testing   Dix-Hallpike Dix-Hallpike Right;Dix-Hallpike Left   Sidelying Test Sidelying Right;Sidelying Left     Dix-Hallpike Right   Dix-Hallpike Right Duration none   Dix-Hallpike Right Symptoms No nystagmus     Dix-Hallpike Left   Dix-Hallpike Left Duration none   Dix-Hallpike Left Symptoms No nystagmus     Sidelying Right   Sidelying Right Duration none   Sidelying Right Symptoms No nystagmus     Sidelying Left   Sidelying Left Duration none   Sidelying Left Symptoms No nystagmus         Pt's BP 160/85 (seated position):   BP in standing 139/82 (pulse 96 bpm)  BP recorded as 168/88 with pulse 87 at end of PT  session                    PT Long Term Goals - 02/05/16 2211      PT LONG TERM GOAL #1   Title Recheck R and L Dix-Hallpike tests for BPPV with pt not having taken Meclizine.  03-07-16   Time 4   Period Weeks   Status New     PT LONG TERM GOAL #2   Title Independent in HEP as appropriate for habituation.  03-07-16   Time 4   Period Weeks   Status New     PT LONG TERM GOAL #3   Title Pt will subjectively report at least 50% improvement in vertigo.  03-07-16   Time 4   Period Weeks   Status New               Plan - 02/05/16 2204    Clinical Impression Statement Pt is an 82 year old lady with symptoms that appear to be consistent with BPPV.  Pt reports she has been taking Meclizine 3x/day since 01-09-16:  this medication may be masking symptoms , preventing nystagmus to be observed in room light.  Symptoms may also be due to orthostatic hypotension.  Requested to assess pt when she has not taken Meclizine prior to PT appt.  PMH includes catarct sx in Dec. 2016, and 2nd sx in Jan 2017; hospitalized 05-07-15 til 05-12-15 for hyponatremia.  She does report wooziness sometimes accompanies the dizziness and states it is worse in the am than it is later in the day.                                                                                                           Rehab Potential Good   PT Frequency 2x / week   PT Duration 4 weeks   PT Treatment/Interventions ADLs/Self Care Home Management;Canalith Repostioning;Balance training;Therapeutic exercise;Therapeutic activities;Neuromuscular re-education;Gait training;Patient/family education;Vestibular   PT Next Visit Plan Dix-Hallpike tests; do orthostatic hypotension assessments   Consulted and Agree with Plan of Care Patient      Patient will benefit  from skilled therapeutic intervention in order to improve the following deficits and impairments:  Dizziness  Visit Diagnosis: Dizziness and giddiness - Plan: PT plan of  care cert/re-cert      G-Codes - [id number] 08/09/2212    Functional Assessment Tool Used clinical judgment   Functional Limitation Changing and maintaining body position   Changing and Maintaining Body Position Current Status [id number]) At least 40 percent but less than 60 percent impaired, limited or restricted   Changing and Maintaining Body Position Goal Status [id number]) At least 20 percent but less than 40 percent impaired, limited or restricted       Problem List Patient Active Problem List   Diagnosis Date Noted  . Hyponatremia 05/07/2015  . Essential hypertension 05/07/2015  . Nausea & vomiting 05/07/2015  . Back pain 05/07/2015  . Mid back pain     Noam Franzen, [place], PT 02/05/2016, 10:19 PM  [facility] [address] [place], [place], [address] Phone: [phone]   Fax:  [phone]  Name: Cathy Silva MRN: [id number] Date of Birth: [birth year]

## 2016-02-06 ENCOUNTER — Ambulatory Visit: Payer: Medicare Other | Admitting: Physical Therapy

## 2016-02-06 VITALS — BP 148/81 | HR 90

## 2016-02-06 DIAGNOSIS — R42 Dizziness and giddiness: Secondary | ICD-10-CM

## 2016-02-07 NOTE — Therapy (Addendum)
[facility] [address] [place], [place], [address] Phone: [phone]   Fax:  [phone]  Physical Therapy Treatment  Patient Details  Name: Cathy Silva MRN: [id number] Date of Birth: [birth year] Referring Provider: Dr. Jodi Marble  Encounter Date: 02/06/2016      PT End of Session - 02/06/16 1723    Visit Number 2   Number of Visits 4   Date for PT Re-Evaluation 03/07/16   Authorization Type medicare   Authorization Time Period 02-05-16 - 04-05-16   PT Start Time 1533   PT Stop Time 1621   PT Time Calculation (min) 48 min      Past Medical History:  Diagnosis Date  . BCC (basal cell carcinoma), arm 2008   Left   . Fracture    in back  . Melanoma in situ [facility]) 2009    Past Surgical History:  Procedure Laterality Date  . BALLOON DILATION N/A 05/11/2015   Procedure: BALLOON DILATION;  Surgeon: Carol Ada, MD;  Location: [facility] ENDOSCOPY;  Service: Endoscopy;  Laterality: N/A;  . DILATION AND CURETTAGE OF UTERUS  05/1984   Prolif  . DILATION AND CURETTAGE OF UTERUS  06/1986   benign  . ESOPHAGOGASTRODUODENOSCOPY Left 05/11/2015   Procedure: ESOPHAGOGASTRODUODENOSCOPY (EGD);  Surgeon: Carol Ada, MD;  Location: [facility] ENDOSCOPY;  Service: Endoscopy;  Laterality: Left;  . EYE SURGERY     bilat cataract  . TONSILLECTOMY    . TOTAL ABDOMINAL HYSTERECTOMY W/ BILATERAL SALPINGOOPHORECTOMY  05/1987   Leiomyoma    Vitals:   02/06/16 1553  BP: (!) 148/81  Pulse: 90        Subjective Assessment - 02/06/16 1712    Subjective Pt reports she has not taken the Meclizine since yesterday - was very trembly this am - took BP and it was very low; ate breakfast and then took her BP medication   Pertinent History cataract surgery in Dec. 2016; states BP rose after this procedure; had 2nd surgery in Jan. 2017 - BP rose again; hypoatremia (ED visit)  with hospitalization 05-07-15 until 05-12-15   Patient Stated Goals  improve dizziness   Currently in Pain? No/denies                Vestibular Assessment - 02/06/16 1600      Orthostatics   BP supine (x 5 minutes) 153/73   HR supine (x 5 minutes) 78   BP sitting 128/72   HR sitting 91   BP standing (after 1 minute) 156/81   HR standing (after 1 minute) 93   BP standing (after 3 minutes) 149/76   HR standing (after 3 minutes) 93   Orthostatics Comment no c/o light-headedness with sit to stand and no c/o's with standing for 3"       NeuroRe-ed:  (-) R and L Dix-Hallpike tests with no nystagmus and no c/o vertigo in test positions; pt does report dizziness with Return to upright position from supine position (-) R and L sidelying tests with no c/o spinning vertigo and no nystagmus observed in test position, but pt reported dizziness with  L sidelying to sitting position  Pt amb. 120' with no device = reported dizziness after amb. 79'    Pt performed standing activity to simulate activity at home which would provoke vertigo - pt stood at table, looking down for approx. 30 secs, Then looked up and reported dizziness  Some of pt's symptoms appear to be consistent with BPPV, however, no  nystagmus observed in any position with any movement or activity -- Symptoms appear to be MORE consistent with orthostasis -Please refer to readings noted above with BP dropping from 153/73 in supine to 128/72 in sitting                 PT Education - 02/06/16 1722    Education provided Yes   Education Details Instructed pt in techniques for orthostatic hypotension - ankle pumps in seated position and marching in place in standing to assist in raising BP   Person(s) Educated Patient   Methods Explanation;Demonstration   Comprehension Verbalized understanding             PT Long Term Goals - 02/07/16 0703      PT LONG TERM GOAL #1   Title Recheck R and L Dix-Hallpike tests for BPPV with pt not having taken Meclizine.  03-07-16   Baseline  completed 02-06-16 -- tests (-) for nystagmus and c/o vertigo in test position   Status Achieved     PT LONG TERM GOAL #2   Title Independent in HEP as appropriate for habituation.  03-07-16   Status Deferred     PT LONG TERM GOAL #3   Title Pt will subjectively report at least 50% improvement in vertigo.  03-07-16   Baseline symptoms appear to be consistent with orthostatic hypotension   Status Deferred               Plan - 02/06/16 1724    Clinical Impression Statement Pt has signs/symptoms consistent with orthostatic hypotension with significant drop in systolic blood pressure with sitting up after lying supine for 5".  No nystagmus observed with any positional testing with pt having not taken Meclizine for past 24 hours.  Pt continues to deny feeling true room spinning vertigo.  Pt did c/o symptoms with ambulating 120' with looking up and reported symptoms in standing with looking up after looking down for a few minutes, as with doing kitchen activities.  Some of pt's symptoms appear to be consistent with BPPV but no nystagmus provoked or observed and no c/o true spinning vertigo described by pt.  Pt consistently reported feeling dizzy with return to upright from both R and L Dix-Hallpike test positions and with supine to sit, as well as with R and L sidelying to seated position.                                                                                                                                         Rehab Potential Good   PT Treatment/Interventions ADLs/Self Care Home Management;Canalith Repostioning;Balance training;Therapeutic exercise;Therapeutic activities;Neuromuscular re-education;Gait training;Patient/family education;Vestibular   PT Next Visit Plan D/C - recommend that pt follow up with MD regarding BP assessment, possible glucose level check as symptoms do not appear to be of vestibular system etiology but rather orthostasis or other medical etiology  PT Home  Exercise Plan techniques to raise BP in seated and standing   Consulted and Agree with Plan of Care Patient      Patient will benefit from skilled therapeutic intervention in order to improve the following deficits and impairments:  Dizziness  Visit Diagnosis: Dizziness and giddiness       G-Codes - Feb 25, 2016 0704    Functional Assessment Tool Used clinical judgment   Functional Limitation Changing and maintaining body position   Changing and Maintaining Body Position Goal Status (S9198) At least 20 percent but less than 40 percent impaired, limited or restricted   Changing and Maintaining Body Position Discharge Status (K2217) At least 40 percent but less than 60 percent impaired, limited or restricted      Problem List Patient Active Problem List   Diagnosis Date Noted  . Hyponatremia 05/07/2015  . Essential hypertension 05/07/2015  . Nausea & vomiting 05/07/2015  . Back pain 05/07/2015  . Mid back pain     PHYSICAL THERAPY DISCHARGE SUMMARY  Visits from Start of Care: 2  Current functional level related to goals / functional outcomes: See above for progress/status towards LTG's   Remaining deficits: See above - pt continues to have c/o dizziness which appears to be due to orthostatic hypotension   Education / Equipment: Educated pt in differential diagnosis - BPPV vs. Orthostatic hypotension; recommend pt to follow up with MD for further diagnostic work up to determine accurate etiology of dizziness Plan: Patient agrees to discharge.  Patient goals were partially met. Patient is being discharged due to lack of progress.  ?????   Symptoms appear to be more related to orthostasis rather than BPPV at this time.        Alda Lea, PT 02/07/2016, 7:05 AM  [facility] [address] [place], [place], [address] Phone: [phone]   Fax:  [phone]  Name: Cathy Silva MRN:  [id number] Date of Birth: 08/26/1929

## 2016-02-19 ENCOUNTER — Ambulatory Visit (HOSPITAL_BASED_OUTPATIENT_CLINIC_OR_DEPARTMENT_OTHER): Payer: Medicare Other | Admitting: Hematology

## 2016-02-19 ENCOUNTER — Telehealth: Payer: Self-pay | Admitting: Hematology

## 2016-02-19 ENCOUNTER — Encounter: Payer: Self-pay | Admitting: Hematology

## 2016-02-19 ENCOUNTER — Ambulatory Visit (HOSPITAL_BASED_OUTPATIENT_CLINIC_OR_DEPARTMENT_OTHER): Payer: Medicare Other

## 2016-02-19 VITALS — BP 165/64 | HR 89 | Temp 97.6°F | Resp 20 | Ht 63.75 in | Wt 160.1 lb

## 2016-02-19 DIAGNOSIS — D473 Essential (hemorrhagic) thrombocythemia: Secondary | ICD-10-CM | POA: Diagnosis not present

## 2016-02-19 DIAGNOSIS — D75839 Thrombocytosis, unspecified: Secondary | ICD-10-CM

## 2016-02-19 DIAGNOSIS — D72829 Elevated white blood cell count, unspecified: Secondary | ICD-10-CM

## 2016-02-19 LAB — COMPREHENSIVE METABOLIC PANEL
ALT: 13 U/L (ref 0–55)
ANION GAP: 10 meq/L (ref 3–11)
AST: 18 U/L (ref 5–34)
Albumin: 3.8 g/dL (ref 3.5–5.0)
Alkaline Phosphatase: 69 U/L (ref 40–150)
BUN: 15.3 mg/dL (ref 7.0–26.0)
CHLORIDE: 98 meq/L (ref 98–109)
CO2: 24 meq/L (ref 22–29)
Calcium: 9.5 mg/dL (ref 8.4–10.4)
Creatinine: 0.8 mg/dL (ref 0.6–1.1)
EGFR: 72 mL/min/{1.73_m2} — AB (ref >90)
GLUCOSE: 104 mg/dL (ref 70–140)
Potassium: 4.4 mEq/L (ref 3.5–5.1)
SODIUM: 132 meq/L — AB (ref 136–145)
TOTAL PROTEIN: 7.6 g/dL (ref 6.4–8.3)
Total Bilirubin: 0.32 mg/dL (ref 0.20–1.20)

## 2016-02-19 LAB — CBC & DIFF AND RETIC
BASO%: 0.7 % (ref 0.0–2.0)
Basophils Absolute: 0.1 10*3/uL (ref 0.0–0.1)
EOS%: 1 % (ref 0.0–7.0)
Eosinophils Absolute: 0.1 10*3/uL (ref 0.0–0.5)
HCT: 39.9 % (ref 34.8–46.6)
HGB: 13.7 g/dL (ref 11.6–15.9)
IMMATURE RETIC FRACT: 1.4 % — AB (ref 1.60–10.00)
LYMPH#: 2.5 10*3/uL (ref 0.9–3.3)
LYMPH%: 22.5 % (ref 14.0–49.7)
MCH: 28.2 pg (ref 25.1–34.0)
MCHC: 34.3 g/dL (ref 31.5–36.0)
MCV: 82.1 fL (ref 79.5–101.0)
MONO#: 1 10*3/uL — AB (ref 0.1–0.9)
MONO%: 9.1 % (ref 0.0–14.0)
NEUT%: 66.7 % (ref 38.4–76.8)
NEUTROS ABS: 7.3 10*3/uL — AB (ref 1.5–6.5)
PLATELETS: 400 10*3/uL (ref 145–400)
RBC: 4.86 10*6/uL (ref 3.70–5.45)
RDW: 13.6 % (ref 11.2–14.5)
RETIC CT ABS: 82.13 10*3/uL (ref 33.70–90.70)
Retic %: 1.69 % (ref 0.70–2.10)
WBC: 10.9 10*3/uL — AB (ref 3.9–10.3)

## 2016-02-19 NOTE — Progress Notes (Signed)
[facility]    HEMATOLOGY/ONCOLOGY CONSULTATION NOTE  Date of Service: 02/19/2016  Patient Care Team: Dibas Koirala, MD as PCP - General (Family Medicine)  CHIEF COMPLAINTS/PURPOSE OF CONSULTATION:   thrombocytosis with mild leukocytosis  HISTORY OF PRESENTING ILLNESS:   Cathy Silva is a wonderful 82 y.o. female who has been referred to [place] by Dr .[name], MD  for evaluation and management of thrombocytosis and mild leucocytosis.   She has a history of iron deficiency anemia, hypertension, dyslipidemia, mild mitral valve prolapse,seasonal allergies and had routine labs with her primary care physician including CBC on 01/19/2016 that showed mild leukocytosis of 11.6k with platelet count of 443k and normal hemoglobin with an MCV of 81. Labs prior to this on 12/30/2015 showed a WBC count of 10.8k and lately count of 438k  She was referred to [place] for evaluation of her thrombocytopenia and leukocytosis.   She notes no fevers no chills no night sweats no unexpected weight loss. No abdominal pain . No new fatigue .  No other acute new focal symptoms suggestive of malignancy or infection.  MEDICAL HISTORY:  Past Medical History:  Diagnosis Date  . BCC (basal cell carcinoma), arm 2008   Left   . Fracture    in back  . Melanoma in situ [facility]) 2009  history of hypertension Dizziness/vertigo on meclizine. Seasonal allergies Iron deficiency anemia. Dyslipidemia Mild mitral valve prolapse Insomnia chronic T7, L1-L2 fracture without evidence of height loss Small hiatal hernia Subclinical hypothyroidism  SURGICAL HISTORY: Past Surgical History:  Procedure Laterality Date  . BALLOON DILATION N/A 05/11/2015   Procedure: BALLOON DILATION;  Surgeon: Carol Ada, MD;  Location: [facility] ENDOSCOPY;  Service: Endoscopy;  Laterality: N/A;  . DILATION AND CURETTAGE OF UTERUS  05/1984   Prolif  . DILATION AND CURETTAGE OF UTERUS  06/1986   benign  . ESOPHAGOGASTRODUODENOSCOPY Left 05/11/2015   Procedure: ESOPHAGOGASTRODUODENOSCOPY (EGD);  Surgeon: Carol Ada, MD;  Location: [facility] ENDOSCOPY;  Service: Endoscopy;  Laterality: Left;  . EYE SURGERY     bilat cataract  . TONSILLECTOMY    . TOTAL ABDOMINAL HYSTERECTOMY W/ BILATERAL SALPINGOOPHORECTOMY  05/1987   Leiomyoma    SOCIAL HISTORY: Social History   Social History  . Marital status: Widowed    Spouse name: N/A  . Number of children: N/A  . Years of education: N/A   Occupational History  . Not on file.   Social History Main Topics  . Smoking status: Never Smoker  . Smokeless tobacco: Not on file  . Alcohol use No  . Drug use: No  . Sexual activity: No     Comment: [facility]   Other Topics Concern  . Not on file   Social History Narrative  . No narrative on file  never smoker no significant history of exposure to secondhand smoke  no significant alcohol use No drug use  FAMILY HISTORY: Family History  Problem Relation Age of Onset  . Heart attack Maternal Grandmother   mother coronary disease  ALLERGIES:  is allergic to codeine; other; pneumococcal vaccines; pseudoephedrine; and tetanus toxoids.  MEDICATIONS:  Current Outpatient Prescriptions  Medication Sig Dispense Refill  . Acetaminophen (TYLENOL PO) Take by mouth as needed.    Marland Kitchen amLODipine (NORVASC) 5 MG tablet Take 1 tablet (5 mg total) by mouth daily. 30 tablet 0  . Ascorbic Acid (VITAMIN C) 1000 MG tablet Take 1,000 mg by mouth daily.    . Calcium Carbonate-Vitamin D (CALCIUM + D PO) Take 1,200 mg by mouth.    Marland Kitchen  feeding supplement (BOOST / RESOURCE BREEZE) LIQD Take 1 Container by mouth 3 (three) times daily between meals.  0  . Loratadine (CLARITIN PO) Take 10 mg by mouth daily as needed. For allergies    . Multiple Vitamins-Minerals (MULTIVITAMIN PO) Take by mouth.    . Omega-3 Fatty Acids (FISH OIL) 1200 MG CAPS Take by mouth.    . pantoprazole (PROTONIX) 40 MG tablet Take 1 tablet (40 mg total) by mouth 2 (two) times daily. (Patient taking  differently: Take 40 mg by mouth daily. ) 60 tablet 0  . traMADol (ULTRAM) 50 MG tablet Take 1 tablet (50 mg total) by mouth every 6 (six) hours as needed for moderate pain (or Headache unrelieved by tylenol). (Patient not taking: Reported on 07/25/2015) 30 tablet 0   No current facility-administered medications for this visit.     REVIEW OF SYSTEMS:    10 Point review of Systems was done is negative except as noted above.  PHYSICAL EXAMINATION: ECOG PERFORMANCE STATUS: 2 - Symptomatic, <50% confined to bed  . Vitals:   02/19/16 1405  BP: (!) 165/64  Pulse: 89  Resp: 20  Temp: 97.6 F (36.4 C)   Filed Weights   02/19/16 1405  Weight: 160 lb 1.6 oz (72.6 kg)   .Body mass index is 27.7 kg/m.  GENERAL:alert, in no acute distress and comfortable SKIN: skin color, texture, turgor are normal, no rashes or significant lesions EYES: normal, conjunctiva are pink and non-injected, sclera clear OROPHARYNX:no exudate, no erythema and lips, buccal mucosa, and tongue normal  NECK: supple, no JVD, thyroid normal size, non-tender, without nodularity LYMPH:  no palpable lymphadenopathy in the cervical, axillary or inguinal LUNGS: clear to auscultation with normal respiratory effort HEART: regular rate & rhythm,  no murmurs and no lower extremity edema ABDOMEN: abdomen soft, non-tender, normoactive bowel sounds , no palpable hepatosplenomegaly Musculoskeletal: no cyanosis of digits and no clubbing  PSYCH: alert & oriented x 3 with fluent speech NEURO: no focal motor/sensory deficits  LABORATORY DATA:  I have reviewed the data as listed   CBC Latest Ref Rng & Units 02/19/2016 05/12/2015 05/11/2015  WBC 3.9 - 10.3 10e3/uL 10.9(H) 9.2 9.3  Hemoglobin 11.6 - 15.9 g/dL 13.7 11.9(L) 11.8(L)  Hematocrit 34.8 - 46.6 % 39.9 34.1(L) 34.8(L)  Platelets 145 - 400 10e3/uL 400 443(H) 416(H)   . CBC    Component Value Date/Time   WBC 10.9 (H) 02/19/2016 1606   WBC 9.2 05/12/2015 0428   RBC 4.86  02/19/2016 1606   RBC 4.28 05/12/2015 0428   HGB 13.7 02/19/2016 1606   HCT 39.9 02/19/2016 1606   PLT 400 02/19/2016 1606   MCV 82.1 02/19/2016 1606   MCH 28.2 02/19/2016 1606   MCH 27.8 05/12/2015 0428   MCHC 34.3 02/19/2016 1606   MCHC 34.9 05/12/2015 0428   RDW 13.6 02/19/2016 1606   LYMPHSABS 2.5 02/19/2016 1606   MONOABS 1.0 (H) 02/19/2016 1606   EOSABS 0.1 02/19/2016 1606   BASOSABS 0.1 02/19/2016 1606     CMP Latest Ref Rng & Units 02/19/2016 02/19/2016 05/12/2015  Glucose 70 - 140 mg/dl 104 - 103(H)  BUN 7.0 - 26.0 mg/dL 15.3 - 10  Creatinine 0.6 - 1.1 mg/dL 0.8 - 0.61  Sodium 136 - 145 mEq/L 132(L) - 130(L)  Potassium 3.5 - 5.1 mEq/L 4.4 - 3.8  Chloride 101 - 111 mmol/L - - 99(L)  CO2 22 - 29 mEq/L 24 - 23  Calcium 8.4 - 10.4 mg/dL 9.5 -  8.7(L)  Total Protein 6.0 - 8.5 g/dL 7.6 7.3 -  Total Bilirubin 0.20 - 1.20 mg/dL 0.32 - -  Alkaline Phos 40 - 150 U/L 69 - -  AST 5 - 34 U/L 18 - -  ALT 0 - 55 U/L 13 - -   Component     Latest Ref Rng & Units 02/19/2016  IgG (Immunoglobin G), Serum     700 - 1600 mg/dL 1,007  IgA/Immunoglobulin A, Serum     64 - 422 mg/dL 397  IgM, Qn, Serum     26 - 217 mg/dL 58  Total Protein     6.0 - 8.5 g/dL 7.3  Albumin SerPl Elph-Mcnc     2.9 - 4.4 g/dL 4.0  Alpha 1     0.0 - 0.4 g/dL 0.2  Alpha2 Glob SerPl Elph-Mcnc     0.4 - 1.0 g/dL 0.8  B-Globulin SerPl Elph-Mcnc     0.7 - 1.3 g/dL 1.2  Gamma Glob SerPl Elph-Mcnc     0.4 - 1.8 g/dL 1.1  M Protein SerPl Elph-Mcnc     Not Observed g/dL Not Observed  Globulin, Total     2.2 - 3.9 g/dL 3.3  Albumin/Glob SerPl     0.7 - 1.7 1.3  IFE 1      Comment  Please Note (HCV):      Comment  Iron     41 - 142 ug/dL 42  TIBC     236 - 444 ug/dL 268  UIBC     120 - 384 ug/dL 226  %SAT     21 - 57 % 16 (L)  Ig Kappa Free Light Chain     3.3 - 19.4 mg/L 18.2  Ig Lambda Free Light Chain     5.7 - 26.3 mg/L 15.6  Kappa/Lambda FluidC Ratio     0.26 - 1.65 1.17  Ferritin      9 - 269 ng/ml 471 (H)  Sed Rate     0 - 40 mm/hr 6     RADIOGRAPHIC STUDIES: I have personally reviewed the radiological images as listed and agreed with the findings in the report. No results found.  ASSESSMENT & PLAN:   82 year old female with  #1 minimal borderline leukocytosis with some borderline monocytosis.  Could be reactive due to passing viral infection or other inflammation.  #2 thrombocytosis with platelet counts of 440k --have now normalized at 400k Could have been from iron Deficiency which has now been corrected. No evidence of hepatosplenomegaly. No polycythemia. Not consistent with essential thrombocytosis- negative for Jak2 V617F, CALR and MPL mutations.  No clinical evidence of a myeloproliferative syndrome at this time with an extended genetic testing panel being negative for MPN . No overt focal symptoms suggestive of a solid tumor. Myeloma panel unrevealing. Cannot rule out an early MDS MPN picture though currently her CBC is completely stable and does not warrant additional workup with a bone marrow biopsy .  Sedimentation rate is within normal limits   PLAN  -clinical and diagnostic possibilities were discussed in details with the patient -No indication of an overt myeloproliferative neoplasm at this time. -No indication for additional workup with a bone marrow biopsy. -Repeat CBC in 6 months with primary care physician to see if there is any significant change. -No indication for any other additional hematologic intervention at this time. -Kindly consult [place] if that is a significant change in her blood counts or clinical symptomatology that could be related to this. -  Patient's dizziness does not appear to be related to this.  Continue follow-up with primary care physician.  All of the patients questions were answered with apparent satisfaction. The patient knows to call the clinic with any problems, questions or concerns.  I spent 45 minutes  counseling the patient face to face. The total time spent in the appointment was 60 minutes and more than 50% was on counseling and direct patient cares.    Sullivan Lone MD [place] AAHIVMS [address] [facility] Hematology/Oncology Physician [facility]  (Office):       [phone] (Work cell):  [phone] (Fax):           [phone]  02/19/2016 2:24 PM

## 2016-02-19 NOTE — Telephone Encounter (Signed)
Lab added for today. AVS report and appointment schedule, given to patient per 02/19/16 los.

## 2016-02-20 LAB — IRON AND TIBC
%SAT: 16 % — AB (ref 21–57)
IRON: 42 ug/dL (ref 41–142)
TIBC: 268 ug/dL (ref 236–444)
UIBC: 226 ug/dL (ref 120–384)

## 2016-02-20 LAB — FERRITIN: Ferritin: 471 ng/ml — ABNORMAL HIGH (ref 9–269)

## 2016-02-20 LAB — KAPPA/LAMBDA LIGHT CHAINS
IG KAPPA FREE LIGHT CHAIN: 18.2 mg/L (ref 3.3–19.4)
Ig Lambda Free Light Chain: 15.6 mg/L (ref 5.7–26.3)
Kappa/Lambda FluidC Ratio: 1.17 (ref 0.26–1.65)

## 2016-02-20 LAB — SEDIMENTATION RATE: Sedimentation Rate-Westergren: 6 mm/hr (ref 0–40)

## 2016-02-21 LAB — MULTIPLE MYELOMA PANEL, SERUM
ALBUMIN SERPL ELPH-MCNC: 4 g/dL (ref 2.9–4.4)
ALPHA2 GLOB SERPL ELPH-MCNC: 0.8 g/dL (ref 0.4–1.0)
Albumin/Glob SerPl: 1.3 (ref 0.7–1.7)
Alpha 1: 0.2 g/dL (ref 0.0–0.4)
B-GLOBULIN SERPL ELPH-MCNC: 1.2 g/dL (ref 0.7–1.3)
GAMMA GLOB SERPL ELPH-MCNC: 1.1 g/dL (ref 0.4–1.8)
GLOBULIN, TOTAL: 3.3 g/dL (ref 2.2–3.9)
IgA, Qn, Serum: 397 mg/dL (ref 64–422)
IgM, Qn, Serum: 58 mg/dL (ref 26–217)
TOTAL PROTEIN: 7.3 g/dL (ref 6.0–8.5)

## 2016-03-08 DIAGNOSIS — Z79899 Other long term (current) drug therapy: Secondary | ICD-10-CM | POA: Diagnosis not present

## 2016-03-08 DIAGNOSIS — Z0001 Encounter for general adult medical examination with abnormal findings: Secondary | ICD-10-CM | POA: Diagnosis not present

## 2016-03-08 DIAGNOSIS — R29898 Other symptoms and signs involving the musculoskeletal system: Secondary | ICD-10-CM | POA: Diagnosis not present

## 2016-03-08 DIAGNOSIS — E039 Hypothyroidism, unspecified: Secondary | ICD-10-CM | POA: Diagnosis not present

## 2016-03-08 DIAGNOSIS — E871 Hypo-osmolality and hyponatremia: Secondary | ICD-10-CM | POA: Diagnosis not present

## 2016-03-08 DIAGNOSIS — I10 Essential (primary) hypertension: Secondary | ICD-10-CM | POA: Diagnosis not present

## 2016-03-20 ENCOUNTER — Telehealth: Payer: Self-pay | Admitting: *Deleted

## 2016-03-20 NOTE — Telephone Encounter (Signed)
In-basket sent to [facility]. Will fax over records once progress note is complete.

## 2016-03-20 NOTE — Telephone Encounter (Signed)
-----   Message from Arty Baumgartner, RN sent at 03/08/2016  3:28 PM EST ----- Regarding: Records Request Dr. Irene Limbo,   [name]'s referring 79 office called asking for updates.  We have faxed them her lab results, but they are also asking for an office note.  Please let us know when you have a chance to complete it so we may fax it to them.    Thanks,  [employer]

## 2016-05-13 DIAGNOSIS — I10 Essential (primary) hypertension: Secondary | ICD-10-CM | POA: Diagnosis not present

## 2016-05-13 DIAGNOSIS — Z23 Encounter for immunization: Secondary | ICD-10-CM | POA: Diagnosis not present

## 2016-05-13 DIAGNOSIS — E039 Hypothyroidism, unspecified: Secondary | ICD-10-CM | POA: Diagnosis not present

## 2016-06-17 ENCOUNTER — Other Ambulatory Visit: Payer: Medicare Other

## 2016-06-17 ENCOUNTER — Ambulatory Visit: Payer: Medicare Other | Admitting: Hematology

## 2016-06-19 ENCOUNTER — Other Ambulatory Visit: Payer: Medicare Other

## 2016-06-19 ENCOUNTER — Ambulatory Visit: Payer: Medicare Other | Admitting: Hematology

## 2016-07-25 ENCOUNTER — Ambulatory Visit: Payer: Medicare Other | Admitting: Certified Nurse Midwife

## 2016-07-30 ENCOUNTER — Encounter: Payer: Self-pay | Admitting: Certified Nurse Midwife

## 2016-07-30 ENCOUNTER — Ambulatory Visit (INDEPENDENT_AMBULATORY_CARE_PROVIDER_SITE_OTHER): Payer: Medicare Other | Admitting: Certified Nurse Midwife

## 2016-07-30 VITALS — BP 110/68 | HR 72 | Resp 14 | Ht 65.0 in | Wt 166.0 lb

## 2016-07-30 DIAGNOSIS — Z1211 Encounter for screening for malignant neoplasm of colon: Secondary | ICD-10-CM | POA: Diagnosis not present

## 2016-07-30 DIAGNOSIS — N951 Menopausal and female climacteric states: Secondary | ICD-10-CM

## 2016-07-30 DIAGNOSIS — Z01419 Encounter for gynecological examination (general) (routine) without abnormal findings: Secondary | ICD-10-CM | POA: Diagnosis not present

## 2016-07-30 DIAGNOSIS — Z124 Encounter for screening for malignant neoplasm of cervix: Secondary | ICD-10-CM | POA: Diagnosis not present

## 2016-07-30 NOTE — Patient Instructions (Signed)

## 2016-07-30 NOTE — Progress Notes (Signed)
83 y.o. G0P0000 Widowed  Caucasian Fe here for annual exam. Menopausal no HRT. Denies vaginal bleeding or vaginal dryness. Denies leaking of urine or bowel issues. Sees PCP Dr Enis Slipper for medication management of hypertension and seeing Hematology for low sodium issues. Sodium is improving and she is less fatigued now. Still driving and cares for self independently. Walks with cane "just in case". Good support system with friends and neighbors. Eats small frequent meals, which works best for her. Water intake normal. No other health issue today.  Patient's last menstrual period was 05/31/1987.          Sexually active: No.  The current method of family planning is status post hysterectomy.    Exercising: No.  The patient does not participate in regular exercise at present. Smoker:  no  Health Maintenance: Pap:  10-01-07 neg MMG:  10/04/15 BIRADS 2 Benign Colonoscopy:  2010 f/u 66yrs declined, requested IFOB BMD:   2017 - Osteopenia TDaP:  1982 pt has reactions Shingles: never Pneumonia: had reaction to 2nd one,  Prenivar recently without issues. Hep C and HIV: never Labs: PCP Self breast exam: not often    reports that she has never smoked. She has never used smokeless tobacco. She reports that she does not drink alcohol or use drugs.  Past Medical History:  Diagnosis Date  . BCC (basal cell carcinoma), arm 2008   Left   . Fracture    in back  . Melanoma in situ [facility]) 2009    Past Surgical History:  Procedure Laterality Date  . BALLOON DILATION N/A 05/11/2015   Procedure: BALLOON DILATION;  Surgeon: Carol Ada, MD;  Location: [facility] ENDOSCOPY;  Service: Endoscopy;  Laterality: N/A;  . DILATION AND CURETTAGE OF UTERUS  05/1984   Prolif  . DILATION AND CURETTAGE OF UTERUS  06/1986   benign  . ESOPHAGOGASTRODUODENOSCOPY Left 05/11/2015   Procedure: ESOPHAGOGASTRODUODENOSCOPY (EGD);  Surgeon: Carol Ada, MD;  Location: [facility] ENDOSCOPY;  Service: Endoscopy;  Laterality: Left;  . EYE SURGERY      bilat cataract  . TONSILLECTOMY    . TOTAL ABDOMINAL HYSTERECTOMY W/ BILATERAL SALPINGOOPHORECTOMY  05/1987   Leiomyoma    Current Outpatient Prescriptions  Medication Sig Dispense Refill  . Acetaminophen (TYLENOL PO) Take by mouth as needed.    Marland Kitchen amLODipine (NORVASC) 5 MG tablet Take 1 tablet (5 mg total) by mouth daily. 30 tablet 0  . Ascorbic Acid (VITAMIN C) 1000 MG tablet Take 1,000 mg by mouth daily.    . Calcium Carbonate-Vitamin D (CALCIUM + D PO) Take 1,200 mg by mouth.    . feeding supplement (BOOST / RESOURCE BREEZE) LIQD Take 1 Container by mouth 3 (three) times daily between meals.  0  . Loratadine (CLARITIN PO) Take 10 mg by mouth daily as needed. For allergies    . Multiple Vitamins-Minerals (MULTIVITAMIN PO) Take by mouth.     No current facility-administered medications for this visit.     Family History  Problem Relation Age of Onset  . Heart attack Maternal Grandmother     ROS:  Pertinent items are noted in HPI.  Otherwise, a comprehensive ROS was negative.  Exam:   BP 110/68 (BP Location: Left Arm, Patient Position: Sitting, Cuff Size: Normal)   Pulse 72   Resp 14   Ht 5\' 5"  (1.651 m)   Wt 166 lb (75.3 kg)   LMP 05/31/1987   BMI 27.62 kg/m  Height: 5\' 5"  (165.1 cm) Ht Readings from Last 3 Encounters:  07/30/16 5\' 5"  (1.651 m)  02/19/16 5' 3.75" (1.619 m)  07/25/15 5' 3.75" (1.619 m)    General appearance: alert, cooperative and appears stated age Head: Normocephalic, without obvious abnormality, atraumatic Neck: no adenopathy, supple, symmetrical, trachea midline and thyroid normal to inspection and palpation Lungs: clear to auscultation bilaterally Breasts: normal appearance, no masses or tenderness, No nipple retraction or dimpling, No nipple discharge or bleeding, No axillary or supraclavicular adenopathy Heart: regular rate and rhythm Abdomen: soft, non-tender; no masses,  no organomegaly Extremities: extremities normal, atraumatic, no  cyanosis or edema Skin: Skin color, texture, turgor normal. No rashes or lesions Lymph nodes: Cervical, supraclavicular, and axillary nodes normal. No abnormal inguinal nodes palpated Neurologic: Grossly normal   Pelvic: External genitalia:  no lesions              Urethra:  normal appearing urethra with no masses, tenderness or lesions              Bartholin's and Skene's: normal                 Vagina: atrophic  appearing vagina with normal color and discharge, no lesions              Cervix: absent              Pap taken: No. Bimanual Exam:  Uterus:  uterus absent              Adnexa: no mass, fullness, tenderness               Rectovaginal: Confirms               Anus:  normal sphincter tone, no lesions  Chaperone present: yes  A:  Well Woman with normal exam  Menopausal no HRT S/P TAH with BSO, fibroids  Hypertension with PCP management  Sodium depletion chronic with hematology management  P:   Reviewed health and wellness pertinent to exam  Aware if vaginal bleeding to advise  Continue follow up with MD and Hematology as indicated, stressed importance of following recommendations.  Discussed activities of daily living and management plan if help needed. Patient feels she has help accessible and financial plan if needed. Discussed ways to prevent falls. Questions addressed.  Declines colonoscopy again, IFOB dispensed  Pap smear as above not taken   counseled on breast self exam, mammography screening, feminine hygiene, adequate intake of calcium and vitamin D, diet and exercise, Kegel's exercises  return annually or prn  An After Visit Summary was printed and given to the patient.

## 2016-08-05 NOTE — Progress Notes (Signed)
Encounter reviewed [name], MD   

## 2016-08-12 DIAGNOSIS — I10 Essential (primary) hypertension: Secondary | ICD-10-CM | POA: Diagnosis not present

## 2016-08-12 DIAGNOSIS — D473 Essential (hemorrhagic) thrombocythemia: Secondary | ICD-10-CM | POA: Diagnosis not present

## 2016-08-12 DIAGNOSIS — E871 Hypo-osmolality and hyponatremia: Secondary | ICD-10-CM | POA: Diagnosis not present

## 2016-08-14 LAB — FECAL OCCULT BLOOD, IMMUNOCHEMICAL: IMMUNOLOGICAL FECAL OCCULT BLOOD TEST: NEGATIVE

## 2016-09-27 ENCOUNTER — Telehealth: Payer: Self-pay | Admitting: Certified Nurse Midwife

## 2016-09-27 NOTE — Telephone Encounter (Signed)
Spoke with patient. Per review of patient's chart last MMG was performed on 10/04/2015. Advised it is recommended that she have a yearly mammogram. Patient is agreeable and will call [facility] to schedule.  Routing to provider for final review. Patient agreeable to disposition. Will close encounter.

## 2016-09-27 NOTE — Telephone Encounter (Signed)
Patient has some questions about her last visit. Patient is asking if she needs to have a MMG due to her age?

## 2016-11-18 DIAGNOSIS — Z1231 Encounter for screening mammogram for malignant neoplasm of breast: Secondary | ICD-10-CM | POA: Diagnosis not present

## 2017-01-15 ENCOUNTER — Encounter: Payer: Self-pay | Admitting: Certified Nurse Midwife

## 2017-01-19 DIAGNOSIS — Z23 Encounter for immunization: Secondary | ICD-10-CM | POA: Diagnosis not present

## 2017-03-17 DIAGNOSIS — L570 Actinic keratosis: Secondary | ICD-10-CM | POA: Diagnosis not present

## 2017-03-17 DIAGNOSIS — D1801 Hemangioma of skin and subcutaneous tissue: Secondary | ICD-10-CM | POA: Diagnosis not present

## 2017-03-17 DIAGNOSIS — D2262 Melanocytic nevi of left upper limb, including shoulder: Secondary | ICD-10-CM | POA: Diagnosis not present

## 2017-03-17 DIAGNOSIS — D225 Melanocytic nevi of trunk: Secondary | ICD-10-CM | POA: Diagnosis not present

## 2017-03-17 DIAGNOSIS — D2271 Melanocytic nevi of right lower limb, including hip: Secondary | ICD-10-CM | POA: Diagnosis not present

## 2017-03-17 DIAGNOSIS — L821 Other seborrheic keratosis: Secondary | ICD-10-CM | POA: Diagnosis not present

## 2017-03-18 DIAGNOSIS — H02839 Dermatochalasis of unspecified eye, unspecified eyelid: Secondary | ICD-10-CM | POA: Diagnosis not present

## 2017-03-18 DIAGNOSIS — H18413 Arcus senilis, bilateral: Secondary | ICD-10-CM | POA: Diagnosis not present

## 2017-03-18 DIAGNOSIS — Z961 Presence of intraocular lens: Secondary | ICD-10-CM | POA: Diagnosis not present

## 2017-03-18 DIAGNOSIS — H35443 Age-related reticular degeneration of retina, bilateral: Secondary | ICD-10-CM | POA: Diagnosis not present

## 2017-03-18 DIAGNOSIS — H26491 Other secondary cataract, right eye: Secondary | ICD-10-CM | POA: Diagnosis not present

## 2017-03-27 DIAGNOSIS — Z9841 Cataract extraction status, right eye: Secondary | ICD-10-CM | POA: Diagnosis not present

## 2017-03-27 DIAGNOSIS — Z961 Presence of intraocular lens: Secondary | ICD-10-CM | POA: Diagnosis not present

## 2017-03-27 DIAGNOSIS — H52221 Regular astigmatism, right eye: Secondary | ICD-10-CM | POA: Diagnosis not present

## 2017-03-27 DIAGNOSIS — H5211 Myopia, right eye: Secondary | ICD-10-CM | POA: Diagnosis not present

## 2017-03-31 DIAGNOSIS — I10 Essential (primary) hypertension: Secondary | ICD-10-CM | POA: Diagnosis not present

## 2017-03-31 DIAGNOSIS — Z0001 Encounter for general adult medical examination with abnormal findings: Secondary | ICD-10-CM | POA: Diagnosis not present

## 2017-03-31 DIAGNOSIS — M810 Age-related osteoporosis without current pathological fracture: Secondary | ICD-10-CM | POA: Diagnosis not present

## 2017-03-31 DIAGNOSIS — D473 Essential (hemorrhagic) thrombocythemia: Secondary | ICD-10-CM | POA: Diagnosis not present

## 2017-03-31 DIAGNOSIS — Z79899 Other long term (current) drug therapy: Secondary | ICD-10-CM | POA: Diagnosis not present

## 2017-03-31 DIAGNOSIS — E7800 Pure hypercholesterolemia, unspecified: Secondary | ICD-10-CM | POA: Diagnosis not present

## 2017-03-31 DIAGNOSIS — E039 Hypothyroidism, unspecified: Secondary | ICD-10-CM | POA: Diagnosis not present

## 2017-05-26 DIAGNOSIS — H903 Sensorineural hearing loss, bilateral: Secondary | ICD-10-CM | POA: Diagnosis not present

## 2017-07-11 DIAGNOSIS — E871 Hypo-osmolality and hyponatremia: Secondary | ICD-10-CM | POA: Diagnosis not present

## 2017-07-31 ENCOUNTER — Encounter: Payer: Self-pay | Admitting: Certified Nurse Midwife

## 2017-07-31 ENCOUNTER — Ambulatory Visit (INDEPENDENT_AMBULATORY_CARE_PROVIDER_SITE_OTHER): Payer: Medicare Other | Admitting: Certified Nurse Midwife

## 2017-07-31 ENCOUNTER — Other Ambulatory Visit: Payer: Self-pay

## 2017-07-31 VITALS — BP 116/62 | HR 74 | Resp 20 | Ht 63.25 in | Wt 149.0 lb

## 2017-07-31 DIAGNOSIS — Z8679 Personal history of other diseases of the circulatory system: Secondary | ICD-10-CM | POA: Diagnosis not present

## 2017-07-31 DIAGNOSIS — Z01419 Encounter for gynecological examination (general) (routine) without abnormal findings: Secondary | ICD-10-CM

## 2017-07-31 DIAGNOSIS — Z124 Encounter for screening for malignant neoplasm of cervix: Secondary | ICD-10-CM | POA: Diagnosis not present

## 2017-07-31 DIAGNOSIS — N951 Menopausal and female climacteric states: Secondary | ICD-10-CM

## 2017-07-31 NOTE — Patient Instructions (Signed)

## 2017-07-31 NOTE — Progress Notes (Signed)
84 y.o. G0P0000 Widowed  Caucasian Fe here for annual exam.  Sees Dr. Mechele Collin PCP every 6 months. Continues to be treated for hypertension with medication decreased.. Continues to work on sodium in diet due to imbalance per PCP. Has lost 13 pounds weight over the past year with change in diet and feels much better. Has neighbor and friend to help if issues. One hospitalization in last year for decrease sodium occurrence and dizziness. Feeling well, walks with cane assistance to prevent falling. Still driving short distances and prepares her meals. Some vaginal dryness, but no issues, no vaginal bleeding that she has seen. No incontinence or bowel issues. No health issues today.  Patient's last menstrual period was 05/31/1987.          Sexually active: No.  The current method of family planning is status post hysterectomy.    Exercising: No.  exercise Smoker:  no  Health Maintenance: Pap:  10-01-07 neg History of Abnormal Pap: no MMG:  11-18-16 category b density birads 1:neg Self Breast exams: no Colonoscopy:  2010 f/u 39yrs BMD:   2017 osteopenia TDaP:  Allergy in the past Shingles: no Pneumonia: had reaction to 2nd one Hep C and HIV: not done Labs: pcp   reports that she has never smoked. She has never used smokeless tobacco. She reports that she does not drink alcohol or use drugs.  Past Medical History:  Diagnosis Date  . BCC (basal cell carcinoma), arm 2008   Left   . Fracture    in back  . Low sodium levels   . Melanoma in situ [facility]) 2009    Past Surgical History:  Procedure Laterality Date  . BALLOON DILATION N/A 05/11/2015   Procedure: BALLOON DILATION;  Surgeon: Carol Ada, MD;  Location: [facility] ENDOSCOPY;  Service: Endoscopy;  Laterality: N/A;  . DILATION AND CURETTAGE OF UTERUS  05/1984   Prolif  . DILATION AND CURETTAGE OF UTERUS  06/1986   benign  . ESOPHAGOGASTRODUODENOSCOPY Left 05/11/2015   Procedure: ESOPHAGOGASTRODUODENOSCOPY (EGD);  Surgeon: Carol Ada, MD;   Location: [facility] ENDOSCOPY;  Service: Endoscopy;  Laterality: Left;  . EYE SURGERY     bilat cataract  . TONSILLECTOMY    . TOTAL ABDOMINAL HYSTERECTOMY W/ BILATERAL SALPINGOOPHORECTOMY  05/1987   Leiomyoma    Current Outpatient Medications  Medication Sig Dispense Refill  . amLODipine (NORVASC) 5 MG tablet Take 1 tablet (5 mg total) by mouth daily. (Patient taking differently: Take 2.5 mg by mouth daily. ) 30 tablet 0  . Ascorbic Acid (VITAMIN C) 1000 MG tablet Take 1,000 mg by mouth daily.    . Calcium Carbonate-Vitamin D (CALCIUM + D PO) Take 1,200 mg by mouth.    . feeding supplement (BOOST / RESOURCE BREEZE) LIQD Take 1 Container by mouth 3 (three) times daily between meals. (Patient taking differently: Take 1 Container by mouth daily. )  0  . Multiple Vitamins-Minerals (MULTIVITAMIN PO) Take by mouth.     No current facility-administered medications for this visit.     Family History  Problem Relation Age of Onset  . Heart attack Maternal Grandmother     ROS:  Pertinent items are noted in HPI.  Otherwise, a comprehensive ROS was negative.  Exam:   BP 116/62   Pulse 74   Resp 20   Ht 5' 3.25" (1.607 m)   Wt 149 lb (67.6 kg)   LMP 05/31/1987   BMI 26.19 kg/m  Height: 5' 3.25" (160.7 cm) Ht Readings from Last 3  Encounters:  07/31/17 5' 3.25" (1.607 m)  07/30/16 5\' 5"  (1.651 m)  02/19/16 5' 3.75" (1.619 m)    General appearance: alert, cooperative and appears stated age Head: Normocephalic, without obvious abnormality, atraumatic Neck: no adenopathy, supple, symmetrical, trachea midline and thyroid normal to inspection and palpation Lungs: clear to auscultation bilaterally Breasts: normal appearance, no masses or tenderness, No nipple retraction or dimpling, No nipple discharge or bleeding, No axillary or supraclavicular adenopathy Heart: regular rate and rhythm Abdomen: soft, non-tender; no masses,  no organomegaly Extremities: extremities normal, atraumatic, no cyanosis  or edema Skin: Skin color, texture, turgor normal. No rashes or lesions Lymph nodes: Cervical, supraclavicular, and axillary nodes normal. No abnormal inguinal nodes palpated Neurologic: Grossly normal   Pelvic: External genitalia:  no lesions              Urethra:  normal appearing urethra with no masses, tenderness or lesions              Bartholin's and Skene's: normal                 Vagina: normal appearing vagina with normal color and discharge, no lesions              Cervix: absent              Pap taken: No. Bimanual Exam:  Uterus:  uterus absent              Adnexa: normal adnexa and no mass, fullness, tenderness               Rectovaginal: Confirms               Anus:  normal sphincter tone, no lesions  Chaperone present: yes  A:  Well Woman with normal exam  Menopausal no HRT, S/P TAH with ovaries removed  Hypertension with MD management  Ambulates with cane for fall prevention only  Lives independently and drives short distances      P:   Reviewed health and wellness pertinent to exam  Discussed continue follow up with PCP as indicated and monitor her sodium as discussed.  Continue to use cane just in case for fall prevention  Encouraged to stay social for health and well being, questions addressed  Pap smear: no   counseled on breast self exam, mammography screening, feminine hygiene and if incontinence occurs, adequate intake of calcium and vitamin D, diet and exercise  return annually or prn  An After Visit Summary was printed and given to the patient.

## 2017-09-29 DIAGNOSIS — E871 Hypo-osmolality and hyponatremia: Secondary | ICD-10-CM | POA: Diagnosis not present

## 2017-09-29 DIAGNOSIS — D473 Essential (hemorrhagic) thrombocythemia: Secondary | ICD-10-CM | POA: Diagnosis not present

## 2017-09-29 DIAGNOSIS — I10 Essential (primary) hypertension: Secondary | ICD-10-CM | POA: Diagnosis not present

## 2017-09-29 DIAGNOSIS — E039 Hypothyroidism, unspecified: Secondary | ICD-10-CM | POA: Diagnosis not present

## 2017-10-27 DIAGNOSIS — E871 Hypo-osmolality and hyponatremia: Secondary | ICD-10-CM | POA: Diagnosis not present

## 2018-01-20 DIAGNOSIS — Z23 Encounter for immunization: Secondary | ICD-10-CM | POA: Diagnosis not present

## 2018-02-02 DIAGNOSIS — Z1231 Encounter for screening mammogram for malignant neoplasm of breast: Secondary | ICD-10-CM | POA: Diagnosis not present

## 2018-02-10 ENCOUNTER — Encounter: Payer: Self-pay | Admitting: Certified Nurse Midwife

## 2018-04-06 DIAGNOSIS — D473 Essential (hemorrhagic) thrombocythemia: Secondary | ICD-10-CM | POA: Diagnosis not present

## 2018-04-06 DIAGNOSIS — Z79899 Other long term (current) drug therapy: Secondary | ICD-10-CM | POA: Diagnosis not present

## 2018-04-06 DIAGNOSIS — E039 Hypothyroidism, unspecified: Secondary | ICD-10-CM | POA: Diagnosis not present

## 2018-04-06 DIAGNOSIS — E871 Hypo-osmolality and hyponatremia: Secondary | ICD-10-CM | POA: Diagnosis not present

## 2018-04-06 DIAGNOSIS — Z0001 Encounter for general adult medical examination with abnormal findings: Secondary | ICD-10-CM | POA: Diagnosis not present

## 2018-04-06 DIAGNOSIS — R5383 Other fatigue: Secondary | ICD-10-CM | POA: Diagnosis not present

## 2018-04-06 DIAGNOSIS — E7800 Pure hypercholesterolemia, unspecified: Secondary | ICD-10-CM | POA: Diagnosis not present

## 2018-04-06 DIAGNOSIS — I10 Essential (primary) hypertension: Secondary | ICD-10-CM | POA: Diagnosis not present

## 2018-05-05 DIAGNOSIS — D1801 Hemangioma of skin and subcutaneous tissue: Secondary | ICD-10-CM | POA: Diagnosis not present

## 2018-05-05 DIAGNOSIS — D2271 Melanocytic nevi of right lower limb, including hip: Secondary | ICD-10-CM | POA: Diagnosis not present

## 2018-05-05 DIAGNOSIS — L821 Other seborrheic keratosis: Secondary | ICD-10-CM | POA: Diagnosis not present

## 2018-05-05 DIAGNOSIS — D2262 Melanocytic nevi of left upper limb, including shoulder: Secondary | ICD-10-CM | POA: Diagnosis not present

## 2018-05-05 DIAGNOSIS — D225 Melanocytic nevi of trunk: Secondary | ICD-10-CM | POA: Diagnosis not present

## 2018-05-05 DIAGNOSIS — L814 Other melanin hyperpigmentation: Secondary | ICD-10-CM | POA: Diagnosis not present

## 2018-05-05 DIAGNOSIS — L570 Actinic keratosis: Secondary | ICD-10-CM | POA: Diagnosis not present

## 2018-08-05 ENCOUNTER — Ambulatory Visit: Payer: Medicare Other | Admitting: Certified Nurse Midwife

## 2018-10-06 DIAGNOSIS — E039 Hypothyroidism, unspecified: Secondary | ICD-10-CM | POA: Diagnosis not present

## 2018-10-06 DIAGNOSIS — D473 Essential (hemorrhagic) thrombocythemia: Secondary | ICD-10-CM | POA: Diagnosis not present

## 2018-10-06 DIAGNOSIS — H6121 Impacted cerumen, right ear: Secondary | ICD-10-CM | POA: Diagnosis not present

## 2018-10-06 DIAGNOSIS — I10 Essential (primary) hypertension: Secondary | ICD-10-CM | POA: Diagnosis not present

## 2019-02-10 DIAGNOSIS — Z23 Encounter for immunization: Secondary | ICD-10-CM | POA: Diagnosis not present

## 2019-07-20 DIAGNOSIS — L821 Other seborrheic keratosis: Secondary | ICD-10-CM | POA: Diagnosis not present

## 2019-07-20 DIAGNOSIS — D2272 Melanocytic nevi of left lower limb, including hip: Secondary | ICD-10-CM | POA: Diagnosis not present

## 2019-07-20 DIAGNOSIS — D225 Melanocytic nevi of trunk: Secondary | ICD-10-CM | POA: Diagnosis not present

## 2019-07-20 DIAGNOSIS — D1801 Hemangioma of skin and subcutaneous tissue: Secondary | ICD-10-CM | POA: Diagnosis not present

## 2019-07-20 DIAGNOSIS — D2262 Melanocytic nevi of left upper limb, including shoulder: Secondary | ICD-10-CM | POA: Diagnosis not present

## 2019-07-20 DIAGNOSIS — L570 Actinic keratosis: Secondary | ICD-10-CM | POA: Diagnosis not present

## 2019-07-20 DIAGNOSIS — D2271 Melanocytic nevi of right lower limb, including hip: Secondary | ICD-10-CM | POA: Diagnosis not present

## 2019-07-21 ENCOUNTER — Encounter: Payer: Self-pay | Admitting: Certified Nurse Midwife

## 2020-02-04 DIAGNOSIS — Z23 Encounter for immunization: Secondary | ICD-10-CM | POA: Diagnosis not present

## 2020-02-23 DIAGNOSIS — Z23 Encounter for immunization: Secondary | ICD-10-CM | POA: Diagnosis not present

## 2020-02-24 DIAGNOSIS — Z961 Presence of intraocular lens: Secondary | ICD-10-CM | POA: Diagnosis not present

## 2020-02-24 DIAGNOSIS — H18413 Arcus senilis, bilateral: Secondary | ICD-10-CM | POA: Diagnosis not present

## 2020-02-24 DIAGNOSIS — H26492 Other secondary cataract, left eye: Secondary | ICD-10-CM | POA: Diagnosis not present

## 2020-02-24 DIAGNOSIS — H35443 Age-related reticular degeneration of retina, bilateral: Secondary | ICD-10-CM | POA: Diagnosis not present

## 2020-03-03 DIAGNOSIS — Z9849 Cataract extraction status, unspecified eye: Secondary | ICD-10-CM | POA: Diagnosis not present

## 2020-03-03 DIAGNOSIS — H52223 Regular astigmatism, bilateral: Secondary | ICD-10-CM | POA: Diagnosis not present

## 2020-03-03 DIAGNOSIS — H5201 Hypermetropia, right eye: Secondary | ICD-10-CM | POA: Diagnosis not present

## 2020-03-03 DIAGNOSIS — H5989 Other postprocedural complications and disorders of eye and adnexa, not elsewhere classified: Secondary | ICD-10-CM | POA: Diagnosis not present

## 2020-03-03 DIAGNOSIS — Z961 Presence of intraocular lens: Secondary | ICD-10-CM | POA: Diagnosis not present

## 2020-03-03 DIAGNOSIS — H524 Presbyopia: Secondary | ICD-10-CM | POA: Diagnosis not present

## 2020-04-14 DIAGNOSIS — E039 Hypothyroidism, unspecified: Secondary | ICD-10-CM | POA: Diagnosis not present

## 2020-04-14 DIAGNOSIS — M4850XA Collapsed vertebra, not elsewhere classified, site unspecified, initial encounter for fracture: Secondary | ICD-10-CM | POA: Diagnosis not present

## 2020-04-14 DIAGNOSIS — R011 Cardiac murmur, unspecified: Secondary | ICD-10-CM | POA: Diagnosis not present

## 2020-04-14 DIAGNOSIS — D473 Essential (hemorrhagic) thrombocythemia: Secondary | ICD-10-CM | POA: Diagnosis not present

## 2020-04-14 DIAGNOSIS — Z0001 Encounter for general adult medical examination with abnormal findings: Secondary | ICD-10-CM | POA: Diagnosis not present

## 2020-04-14 DIAGNOSIS — E7800 Pure hypercholesterolemia, unspecified: Secondary | ICD-10-CM | POA: Diagnosis not present

## 2020-04-14 DIAGNOSIS — E2839 Other primary ovarian failure: Secondary | ICD-10-CM | POA: Diagnosis not present

## 2020-04-14 DIAGNOSIS — I10 Essential (primary) hypertension: Secondary | ICD-10-CM | POA: Diagnosis not present

## 2020-04-14 DIAGNOSIS — Z79899 Other long term (current) drug therapy: Secondary | ICD-10-CM | POA: Diagnosis not present

## 2020-04-18 ENCOUNTER — Other Ambulatory Visit (HOSPITAL_COMMUNITY): Payer: Self-pay | Admitting: Family Medicine

## 2020-04-18 DIAGNOSIS — R011 Cardiac murmur, unspecified: Secondary | ICD-10-CM

## 2020-05-10 ENCOUNTER — Encounter (INDEPENDENT_AMBULATORY_CARE_PROVIDER_SITE_OTHER): Payer: Self-pay

## 2020-05-10 ENCOUNTER — Ambulatory Visit (HOSPITAL_COMMUNITY): Payer: Medicare Other | Attending: Cardiology

## 2020-05-10 ENCOUNTER — Other Ambulatory Visit: Payer: Self-pay

## 2020-05-10 DIAGNOSIS — R011 Cardiac murmur, unspecified: Secondary | ICD-10-CM

## 2020-05-10 LAB — ECHOCARDIOGRAM COMPLETE
AR max vel: 0.91 cm2
AV Area VTI: 0.9 cm2
AV Area mean vel: 0.92 cm2
AV Mean grad: 18 mmHg
AV Peak grad: 31.4 mmHg
Ao pk vel: 2.8 m/s
Area-P 1/2: 2.66 cm2
S' Lateral: 2.4 cm

## 2020-05-11 DIAGNOSIS — M8589 Other specified disorders of bone density and structure, multiple sites: Secondary | ICD-10-CM | POA: Diagnosis not present

## 2020-05-11 DIAGNOSIS — Z1231 Encounter for screening mammogram for malignant neoplasm of breast: Secondary | ICD-10-CM | POA: Diagnosis not present

## 2020-06-05 ENCOUNTER — Telehealth: Payer: Self-pay

## 2020-06-05 NOTE — Telephone Encounter (Signed)
NOTES ON FILE FROM [facility] [phone], SENT REFERRAL TO SCHEDULING 

## 2020-06-07 DIAGNOSIS — E871 Hypo-osmolality and hyponatremia: Secondary | ICD-10-CM | POA: Diagnosis not present

## 2020-06-07 DIAGNOSIS — I10 Essential (primary) hypertension: Secondary | ICD-10-CM | POA: Diagnosis not present

## 2020-06-07 DIAGNOSIS — R918 Other nonspecific abnormal finding of lung field: Secondary | ICD-10-CM | POA: Diagnosis not present

## 2020-06-08 DIAGNOSIS — E871 Hypo-osmolality and hyponatremia: Secondary | ICD-10-CM | POA: Diagnosis not present

## 2020-06-26 ENCOUNTER — Encounter (INDEPENDENT_AMBULATORY_CARE_PROVIDER_SITE_OTHER): Payer: Self-pay

## 2020-06-26 ENCOUNTER — Other Ambulatory Visit: Payer: Self-pay

## 2020-06-26 ENCOUNTER — Encounter: Payer: Self-pay | Admitting: Internal Medicine

## 2020-06-26 ENCOUNTER — Ambulatory Visit (INDEPENDENT_AMBULATORY_CARE_PROVIDER_SITE_OTHER): Payer: Medicare Other | Admitting: Internal Medicine

## 2020-06-26 VITALS — BP 140/70 | HR 93 | Ht 63.5 in | Wt 144.0 lb

## 2020-06-26 DIAGNOSIS — I700 Atherosclerosis of aorta: Secondary | ICD-10-CM | POA: Insufficient documentation

## 2020-06-26 DIAGNOSIS — I10 Essential (primary) hypertension: Secondary | ICD-10-CM | POA: Diagnosis not present

## 2020-06-26 DIAGNOSIS — I350 Nonrheumatic aortic (valve) stenosis: Secondary | ICD-10-CM | POA: Diagnosis not present

## 2020-06-26 DIAGNOSIS — E785 Hyperlipidemia, unspecified: Secondary | ICD-10-CM

## 2020-06-26 DIAGNOSIS — I4510 Unspecified right bundle-branch block: Secondary | ICD-10-CM | POA: Diagnosis not present

## 2020-06-26 NOTE — Patient Instructions (Addendum)
Medication Instructions:  Your physician recommends that you continue on your current medications as directed. Please refer to the Current Medication list given to you today.  *If you need a refill on your cardiac medications before your next appointment, please call your pharmacy*   Lab Work: NONE If you have labs (blood work) drawn today and your tests are completely normal, you will receive your results only by: Marland Kitchen MyChart Message (if you have MyChart) OR . A paper copy in the mail If you have any lab test that is abnormal or we need to change your treatment, we will call you to review the results.   Testing/Procedures: Your physician has requested that you have an echocardiogram in 6 months. Echocardiography is a painless test that uses sound waves to create images of your heart. It provides your doctor with information about the size and shape of your heart and how well your heart's chambers and valves are working. This procedure takes approximately one hour. There are no restrictions for this procedure.     Follow-Up: At [facility], you and your health needs are our priority.  As part of our continuing mission to provide you with exceptional heart care, we have created designated Provider Care Teams.  These Care Teams include your primary Cardiologist (physician) and Advanced Practice Providers (APPs -  Physician Assistants and Nurse Practitioners) who all work together to provide you with the care you need, when you need it.  We recommend signing up for the patient portal called "MyChart".  Sign up information is provided on this After Visit Summary.  MyChart is used to connect with patients for Virtual Visits (Telemedicine).  Patients are able to view lab/test results, encounter notes, upcoming appointments, etc.  Non-urgent messages can be sent to your provider as well.   To learn more about what you can do with MyChart, go to [email or web address].    Your next appointment:    7 month(s)  The format for your next appointment:   In Person  Provider:   You may see Rudean Haskell, MD  or one of the following Advanced Practice Providers on your designated Care Team:    Melina Copa, PA-C  Ermalinda Barrios, PA-C

## 2020-06-26 NOTE — Progress Notes (Addendum)
Cardiology Office Note:    Date:  06/26/2020   ID:  Cathy Silva, DOB 15-Jan-1930, MRN [id number]  PCP:  [name], [place]  Cardiologist:  No primary care provider on file.  Advanced Practice Provider:  No care team member to display Electrophysiologist:  None       CC: Moderate to Severe Aortic Stenosis Consulted for the evaluation of  at the behest of Cathy Silva, [place], MD  History of Present Illness:    Cathy Silva is a 87 y.o. female with a hx of HTN, HLD, Aortic Atherosclerosis, CAC, Mitral Valve Prolapse (possible) who presents for evaluation 06/26/20.  Patient notes that she is feeling fine until 2017- started having vomiting and found to have hyponatremia.  Since then has never quite had her strength back. Got a new diagnosis of HTN during that admission.   Notes that she, rarely, has left sternal chest pain lasting 3 s not present on exertion.  Happens to resolve spontaneously.    Notes that she doesn't have a lot of energy and thinks is related to her blood pressure.  Since the pandemic she has trying to stay away from people, has been vaccinated and boosted but doesn't leave the house much.  Patient exertion is limited but feels less stable on her feet.  Patient is a member of the [employer]- but hasn't been back since COVID-19.  Furthest she was walked is to the car.  Lives independently and does ADLs.  No shortness of breath at rest.  No PND or orthopnea.  No bendopnea,  leg swelling , or abdominal swelling.  No syncope or near syncope. Notes  no palpitations or funny heart beats.   Does not significant fatigue. Presently all patient [place] is go to [place] and to [facility].  Notes that her Norvasc was lowered to 2.5 mg because of hypotension.  Stable at present doses.  Past Medical History:  Diagnosis Date  . Allergies   . BCC (basal cell carcinoma), arm 2008   Left   . Elevated cholesterol   . Estrogen deficiency   . Fracture     in back  . Hx of scarlet fever   . Hypercholesteremia   . Hyponatremia   . Insomnia   . Low sodium levels   . Melanoma in situ ([name]) 2009  . Mitral valve prolapse   . Murmur   . Psychosocial problem   . Thrombocytosis     Past Surgical History:  Procedure Laterality Date  . BALLOON DILATION N/A 05/11/2015   Procedure: BALLOON DILATION;  Surgeon: Carol Ada, MD;  Location: [facility] ENDOSCOPY;  Service: Endoscopy;  Laterality: N/A;  . DILATION AND CURETTAGE OF UTERUS  05/1984   Prolif  . DILATION AND CURETTAGE OF UTERUS  06/1986   benign  . ESOPHAGOGASTRODUODENOSCOPY Left 05/11/2015   Procedure: ESOPHAGOGASTRODUODENOSCOPY (EGD);  Surgeon: Carol Ada, MD;  Location: [facility] ENDOSCOPY;  Service: Endoscopy;  Laterality: Left;  . EYE SURGERY     bilat cataract  . TONSILLECTOMY    . TOTAL ABDOMINAL HYSTERECTOMY W/ BILATERAL SALPINGOOPHORECTOMY  05/1987   Leiomyoma    Current Medications: Current Meds  Medication Sig  . amLODipine (NORVASC) 2.5 MG tablet daily.  . Ascorbic Acid (VITAMIN C) 1000 MG tablet Take 1,000 mg by mouth daily.  . Calcium Carbonate-Vitamin D (CALCIUM + D PO) Take 1,200 mg by mouth.  . feeding supplement (BOOST / RESOURCE BREEZE) LIQD Take 1 Container by mouth 3 (three)  times daily between meals. (Patient taking differently: Take 1 Container by mouth daily.)  . Multiple Vitamins-Minerals (MULTIVITAMIN PO) Take by mouth.  . Omega-3 Fatty Acids (FISH OIL) 600 MG CAPS in the morning and at bedtime.     Allergies:   Codeine, Other, Pneumococcal vaccines, Procaine, Pseudoephedrine, Tetanus toxoids, and Tetanus-diphtheria toxoids td   Social History   Socioeconomic History  . Marital status: Widowed    Spouse name: Not on file  . Number of children: Not on file  . Years of education: Not on file  . Highest education level: Not on file  Occupational History  . Not on file  Tobacco Use  . Smoking status: Never Smoker  . Smokeless tobacco: Never Used  Substance and  Sexual Activity  . Alcohol use: No    Alcohol/week: 0.0 standard drinks  . Drug use: No  . Sexual activity: Not Currently    Birth control/protection: Surgical    Comment: TV[facility]  Other Topics Concern  . Not on file  Social History Narrative  . Not on file   Social Determinants of Health   Financial Resource Strain: Not on file  Food Insecurity: Not on file  Transportation Needs: Not on file  Physical Activity: Not on file  Stress: Not on file  Social Connections: Not on file     Family History: The patient's family history includes CAD in her mother; Heart attack in her maternal grandmother; Hemangiomas in her brother; Tuberculosis in her father. History of coronary artery disease notable for grandmother, possibly mother.  History of heart failure notable for no members History of arrhythmia notable for no members.  ROS:   Please see the history of present illness.     All other systems reviewed and are negative.  EKGs/Labs/Other Studies Reviewed:    The following studies were reviewed today:  EKG:  06/26/20: SR 93 RBBB LAD  Transthoracic Echocardiogram: Date: 05/10/20 Results:  Paradoxical LFLFAS 1. Left ventricular ejection fraction, by estimation, is 60 to 65%. The  left ventricle has normal function. The left ventricle has no regional  wall motion abnormalities. There is moderate hypertrophy of the basal  septum and mild concentric hypertrophy  of the rest of the LV segments. Left ventricular diastolic parameters are  consistent with Grade I diastolic dysfunction (impaired relaxation).  Elevated left atrial pressure.  2. Right ventricular systolic function is normal. The right ventricular  size is normal.  3. The mitral valve is degenerative. Mild mitral valve regurgitation.  Moderate mitral annular calcification. There is mild thickening and  calcification of the mitral valve leaflets, most notably of the anterior  leaflet.  4. The aortic valve is  tricuspid. There is severe calcifcation of the  aortic valve. There is severe thickening of the aortic valve. Aortic valve  regurgitation is trivial. Moderate aortic valve stenosis. Aortic valve  mean gradient measures 18.0 mmHg.  Aortic valve peak gradient measures 31.4 mmHg. Aortic valve area, by VTI  measures 0.90 cm. DOI 0.29.   NonCardiac CT Aorta Protocol: Date: 05/07/2015 Results: Coronary calcification and Aortic atherosclerosis  No acute dissection, aneurysm or other evidence of acute aortic syndrome.  Atherosclerotic vascular calcifications including 3 vessel coronary artery calcifications.  Multiple pulmonary nodules measuring up to 5 mm. While these are highly likely (in the absence of a known primary malignancy. If the patient has a known primary malignancy then metastatic disease would be more likely) the sequelae of a prior infectious/inflammatory or granulomatous process, stability cannot be confirmed without  prior imaging for comparison. Consider follow-up CT scan of the chest in 6-12 months if the documentation of stability is clinically warranted.  1. Moderate respiratory/patient motion artifact. 2. No evidence of acute aortic dissection, aneurysm or other acute vascular abnormality. 3. Incidental note is made of variant anatomy of the celiac artery origin. 4. Atherosclerotic vascular calcifications without significant stenosis to suggest a source for chronic mesenteric ischemia. 5. Colonic diverticular disease without CT evidence of active inflammation. 6. Chronic L1 and L2 compression fractures without evidence of progressive height loss compared to 04/18/2012.  Recent Labs: No results found for requested labs within last 8760 hours.  Recent Lipid Panel No results found for: CHOL, TRIG, HDL, CHOLHDL, VLDL, LDLCALC, LDLDIRECT  Outside Hospital  or Outside Clinic Studies (OSH):  Date: 05/31/20 Cholesterol 223 HDL 58 LDL 144 Tgs 121 Creatinine 0.78 TSH  NA BNP NA  Risk Assessment/Calculations:     N/A  Physical Exam:    VS:  BP 140/70   Pulse 93   Ht 5' 3.5" (1.613 m)   Wt 144 lb (65.3 kg)   LMP 05/31/1987   SpO2 97%   BMI 25.11 kg/m     Wt Readings from Last 3 Encounters:  06/26/20 144 lb (65.3 kg)  07/31/17 149 lb (67.6 kg)  07/30/16 166 lb (75.3 kg)     GEN: Elderly female well developed in no acute distress HEENT: Normal NECK: No JVD; No carotid bruits thought radiation or her AS is faintly heard LYMPHATICS: No lymphadenopathy CARDIAC: RRR, III/VI Systolic Crescendo rubs, gallops +1 radial pulse bilaterally RESPIRATORY:  Clear to auscultation without rales, wheezing or rhonchi  ABDOMEN: Soft, non-tender, non-distended MUSCULOSKELETAL:  No edema; No deformity  SKIN: Warm and dry NEUROLOGIC:  Alert and oriented x 3 PSYCHIATRIC:  Normal affect   ASSESSMENT:    1. Nonrheumatic aortic valve stenosis   2. Aortic atherosclerosis ([place])   3. Essential hypertension   4. Hyperlipidemia, unspecified hyperlipidemia type    PLAN:    In order of problems listed above:  P LFLGAS Moderate to Severe AS RBBB (relevant if TAVR) - Shared Decision Making:  Discussed the risks and benefits of invasive procedures (TAVR) and getting CT Scans:  Patient has no syncope or DOE and has a lot of hesitation about more invasive therapy- feels lucky to have made it this long:  We will repeat Echo in 6 months; patient will bring a friend to next meeting and we can discuss TAVR CT's or only medical management.  Patient is aware of red flag symptoms  Essential Hypertension - reasonable on norvasc 2.5; predominantly managed by Primary MD  Aortic Atherosclerosis Hyperlipidemia  Coronary Artery Calcifications -LDL goal less than 70 - would offer rosuvastatin 10 mg PO daily (patient has deferred) - gave education on dietary changes  6 months after echo follow up unless new symptoms or abnormal test results warranting change in plan  Would  be reasonable for  Video Visit Follow up Would be reasonable for  APP Follow up  Time Spent Directly with Patient:   I have spent a total of 60 with the patient reviewing hospital notes, telemetry, EKGs, labs and examining the patient as well as establishing an assessment and plan that was discussed personally with the patient.  > 50% of time was spent in direct patient care discussing the natural course of AS and options.   Medication Adjustments/Labs and Tests Ordered: Current medicines are reviewed at length with the patient today.  Concerns regarding medicines  are outlined above.  Orders Placed This Encounter  Procedures  . EKG 12-Lead  . ECHOCARDIOGRAM COMPLETE   No orders of the defined types were placed in this encounter.   Patient Instructions  Medication Instructions:  Your physician recommends that you continue on your current medications as directed. Please refer to the Current Medication list given to you today.  *If you need a refill on your cardiac medications before your next appointment, please call your pharmacy*   Lab Work: NONE If you have labs (blood work) drawn today and your tests are completely normal, you will receive your results only by: Marland Kitchen MyChart Message (if you have MyChart) OR . A paper copy in the mail If you have any lab test that is abnormal or we need to change your treatment, we will call you to review the results.   Testing/Procedures: Your physician has requested that you have an echocardiogram in 6 months. Echocardiography is a painless test that uses sound waves to create images of your heart. It provides your doctor with information about the size and shape of your heart and how well your heart's chambers and valves are working. This procedure takes approximately one hour. There are no restrictions for this procedure.     Follow-Up: At [facility], you and your health needs are our priority.  As part of our continuing mission to  provide you with exceptional heart care, we have created designated Provider Care Teams.  These Care Teams include your primary Cardiologist (physician) and Advanced Practice Providers (APPs -  Physician Assistants and Nurse Practitioners) who all work together to provide you with the care you need, when you need it.  We recommend signing up for the patient portal called "MyChart".  Sign up information is provided on this After Visit Summary.  MyChart is used to connect with patients for Virtual Visits (Telemedicine).  Patients are able to view lab/test results, encounter notes, upcoming appointments, etc.  Non-urgent messages can be sent to your provider as well.   To learn more about what you can do with MyChart, go to [email or web address].    Your next appointment:   7 month(s)  The format for your next appointment:   In Person  Provider:   You may see Rudean Haskell, MD  or one of the following Advanced Practice Providers on your designated Care Team:    Melina Copa, PA-C  Ermalinda Barrios, PA-C          Signed, Werner Lean, MD  06/26/2020 3:25 PM    [place]

## 2020-08-23 DIAGNOSIS — D2262 Melanocytic nevi of left upper limb, including shoulder: Secondary | ICD-10-CM | POA: Diagnosis not present

## 2020-08-23 DIAGNOSIS — L814 Other melanin hyperpigmentation: Secondary | ICD-10-CM | POA: Diagnosis not present

## 2020-08-23 DIAGNOSIS — D2272 Melanocytic nevi of left lower limb, including hip: Secondary | ICD-10-CM | POA: Diagnosis not present

## 2020-08-23 DIAGNOSIS — D1801 Hemangioma of skin and subcutaneous tissue: Secondary | ICD-10-CM | POA: Diagnosis not present

## 2020-08-23 DIAGNOSIS — D2271 Melanocytic nevi of right lower limb, including hip: Secondary | ICD-10-CM | POA: Diagnosis not present

## 2020-08-23 DIAGNOSIS — L821 Other seborrheic keratosis: Secondary | ICD-10-CM | POA: Diagnosis not present

## 2020-08-23 DIAGNOSIS — D225 Melanocytic nevi of trunk: Secondary | ICD-10-CM | POA: Diagnosis not present

## 2020-10-16 DIAGNOSIS — E871 Hypo-osmolality and hyponatremia: Secondary | ICD-10-CM | POA: Diagnosis not present

## 2020-10-16 DIAGNOSIS — E039 Hypothyroidism, unspecified: Secondary | ICD-10-CM | POA: Diagnosis not present

## 2020-10-16 DIAGNOSIS — L609 Nail disorder, unspecified: Secondary | ICD-10-CM | POA: Diagnosis not present

## 2020-10-16 DIAGNOSIS — I350 Nonrheumatic aortic (valve) stenosis: Secondary | ICD-10-CM | POA: Diagnosis not present

## 2020-10-19 DIAGNOSIS — M25774 Osteophyte, right foot: Secondary | ICD-10-CM | POA: Diagnosis not present

## 2020-10-19 DIAGNOSIS — M25775 Osteophyte, left foot: Secondary | ICD-10-CM | POA: Diagnosis not present

## 2020-10-19 DIAGNOSIS — B351 Tinea unguium: Secondary | ICD-10-CM | POA: Diagnosis not present

## 2020-11-28 DIAGNOSIS — Z23 Encounter for immunization: Secondary | ICD-10-CM | POA: Diagnosis not present

## 2020-12-25 ENCOUNTER — Ambulatory Visit (HOSPITAL_COMMUNITY): Payer: Medicare Other | Attending: Cardiovascular Disease

## 2020-12-25 ENCOUNTER — Other Ambulatory Visit: Payer: Self-pay

## 2020-12-25 DIAGNOSIS — I350 Nonrheumatic aortic (valve) stenosis: Secondary | ICD-10-CM | POA: Insufficient documentation

## 2020-12-25 LAB — ECHOCARDIOGRAM COMPLETE
AR max vel: 0.63 cm2
AV Area VTI: 0.62 cm2
AV Area mean vel: 0.58 cm2
AV Mean grad: 28.5 mmHg
AV Peak grad: 46.5 mmHg
Ao pk vel: 3.41 m/s
Area-P 1/2: 2.75 cm2
S' Lateral: 2.6 cm

## 2021-02-06 DIAGNOSIS — Z23 Encounter for immunization: Secondary | ICD-10-CM | POA: Diagnosis not present

## 2021-02-24 ENCOUNTER — Other Ambulatory Visit (HOSPITAL_COMMUNITY): Payer: Medicare Other

## 2021-02-24 ENCOUNTER — Emergency Department (HOSPITAL_COMMUNITY): Payer: Medicare Other

## 2021-02-24 ENCOUNTER — Encounter (HOSPITAL_COMMUNITY): Payer: Self-pay | Admitting: Family Medicine

## 2021-02-24 ENCOUNTER — Other Ambulatory Visit: Payer: Self-pay

## 2021-02-24 ENCOUNTER — Inpatient Hospital Stay (HOSPITAL_COMMUNITY)
Admission: EM | Admit: 2021-02-24 | Discharge: 2021-02-28 | DRG: 565 | Disposition: A | Discharge status: Skilled Nursing Facility | Payer: Medicare Other | Attending: Internal Medicine | Admitting: Internal Medicine

## 2021-02-24 DIAGNOSIS — W1809XA Striking against other object with subsequent fall, initial encounter: Secondary | ICD-10-CM | POA: Diagnosis present

## 2021-02-24 DIAGNOSIS — R531 Weakness: Secondary | ICD-10-CM | POA: Diagnosis not present

## 2021-02-24 DIAGNOSIS — I4510 Unspecified right bundle-branch block: Secondary | ICD-10-CM | POA: Diagnosis present

## 2021-02-24 DIAGNOSIS — E876 Hypokalemia: Secondary | ICD-10-CM | POA: Diagnosis not present

## 2021-02-24 DIAGNOSIS — E785 Hyperlipidemia, unspecified: Secondary | ICD-10-CM | POA: Diagnosis present

## 2021-02-24 DIAGNOSIS — N390 Urinary tract infection, site not specified: Secondary | ICD-10-CM | POA: Diagnosis present

## 2021-02-24 DIAGNOSIS — M25551 Pain in right hip: Secondary | ICD-10-CM | POA: Diagnosis not present

## 2021-02-24 DIAGNOSIS — S0990XA Unspecified injury of head, initial encounter: Secondary | ICD-10-CM | POA: Diagnosis not present

## 2021-02-24 DIAGNOSIS — Z8249 Family history of ischemic heart disease and other diseases of the circulatory system: Secondary | ICD-10-CM

## 2021-02-24 DIAGNOSIS — Z20822 Contact with and (suspected) exposure to covid-19: Secondary | ICD-10-CM | POA: Diagnosis present

## 2021-02-24 DIAGNOSIS — R339 Retention of urine, unspecified: Secondary | ICD-10-CM

## 2021-02-24 DIAGNOSIS — N179 Acute kidney failure, unspecified: Secondary | ICD-10-CM | POA: Diagnosis present

## 2021-02-24 DIAGNOSIS — I080 Rheumatic disorders of both mitral and aortic valves: Secondary | ICD-10-CM | POA: Diagnosis present

## 2021-02-24 DIAGNOSIS — E7800 Pure hypercholesterolemia, unspecified: Secondary | ICD-10-CM | POA: Diagnosis present

## 2021-02-24 DIAGNOSIS — Z885 Allergy status to narcotic agent status: Secondary | ICD-10-CM

## 2021-02-24 DIAGNOSIS — M25522 Pain in left elbow: Secondary | ICD-10-CM | POA: Diagnosis not present

## 2021-02-24 DIAGNOSIS — B965 Pseudomonas (aeruginosa) (mallei) (pseudomallei) as the cause of diseases classified elsewhere: Secondary | ICD-10-CM | POA: Diagnosis present

## 2021-02-24 DIAGNOSIS — Z884 Allergy status to anesthetic agent status: Secondary | ICD-10-CM | POA: Diagnosis not present

## 2021-02-24 DIAGNOSIS — I10 Essential (primary) hypertension: Secondary | ICD-10-CM | POA: Diagnosis present

## 2021-02-24 DIAGNOSIS — R011 Cardiac murmur, unspecified: Secondary | ICD-10-CM | POA: Diagnosis present

## 2021-02-24 DIAGNOSIS — Z887 Allergy status to serum and vaccine status: Secondary | ICD-10-CM | POA: Diagnosis not present

## 2021-02-24 DIAGNOSIS — Z831 Family history of other infectious and parasitic diseases: Secondary | ICD-10-CM

## 2021-02-24 DIAGNOSIS — R2681 Unsteadiness on feet: Secondary | ICD-10-CM | POA: Diagnosis not present

## 2021-02-24 DIAGNOSIS — R42 Dizziness and giddiness: Secondary | ICD-10-CM | POA: Diagnosis not present

## 2021-02-24 DIAGNOSIS — T796XXA Traumatic ischemia of muscle, initial encounter: Principal | ICD-10-CM | POA: Diagnosis present

## 2021-02-24 DIAGNOSIS — W1830XA Fall on same level, unspecified, initial encounter: Secondary | ICD-10-CM | POA: Diagnosis present

## 2021-02-24 DIAGNOSIS — M549 Dorsalgia, unspecified: Secondary | ICD-10-CM | POA: Diagnosis present

## 2021-02-24 DIAGNOSIS — Z85828 Personal history of other malignant neoplasm of skin: Secondary | ICD-10-CM | POA: Diagnosis not present

## 2021-02-24 DIAGNOSIS — R0902 Hypoxemia: Secondary | ICD-10-CM | POA: Diagnosis not present

## 2021-02-24 DIAGNOSIS — R1312 Dysphagia, oropharyngeal phase: Secondary | ICD-10-CM | POA: Diagnosis not present

## 2021-02-24 DIAGNOSIS — N281 Cyst of kidney, acquired: Secondary | ICD-10-CM | POA: Diagnosis not present

## 2021-02-24 DIAGNOSIS — M25512 Pain in left shoulder: Secondary | ICD-10-CM | POA: Diagnosis not present

## 2021-02-24 DIAGNOSIS — Z888 Allergy status to other drugs, medicaments and biological substances status: Secondary | ICD-10-CM | POA: Diagnosis not present

## 2021-02-24 DIAGNOSIS — Z79899 Other long term (current) drug therapy: Secondary | ICD-10-CM

## 2021-02-24 DIAGNOSIS — I959 Hypotension, unspecified: Secondary | ICD-10-CM | POA: Diagnosis not present

## 2021-02-24 DIAGNOSIS — I350 Nonrheumatic aortic (valve) stenosis: Secondary | ICD-10-CM

## 2021-02-24 DIAGNOSIS — W19XXXA Unspecified fall, initial encounter: Secondary | ICD-10-CM | POA: Diagnosis not present

## 2021-02-24 DIAGNOSIS — I700 Atherosclerosis of aorta: Secondary | ICD-10-CM | POA: Diagnosis present

## 2021-02-24 DIAGNOSIS — M6282 Rhabdomyolysis: Secondary | ICD-10-CM | POA: Diagnosis present

## 2021-02-24 DIAGNOSIS — E871 Hypo-osmolality and hyponatremia: Secondary | ICD-10-CM | POA: Diagnosis not present

## 2021-02-24 DIAGNOSIS — R262 Difficulty in walking, not elsewhere classified: Secondary | ICD-10-CM | POA: Diagnosis not present

## 2021-02-24 DIAGNOSIS — Z7401 Bed confinement status: Secondary | ICD-10-CM | POA: Diagnosis not present

## 2021-02-24 DIAGNOSIS — M25552 Pain in left hip: Secondary | ICD-10-CM | POA: Diagnosis not present

## 2021-02-24 DIAGNOSIS — I248 Other forms of acute ischemic heart disease: Secondary | ICD-10-CM | POA: Diagnosis present

## 2021-02-24 DIAGNOSIS — R410 Disorientation, unspecified: Secondary | ICD-10-CM | POA: Diagnosis not present

## 2021-02-24 DIAGNOSIS — M6281 Muscle weakness (generalized): Secondary | ICD-10-CM | POA: Diagnosis not present

## 2021-02-24 DIAGNOSIS — R778 Other specified abnormalities of plasma proteins: Secondary | ICD-10-CM | POA: Diagnosis not present

## 2021-02-24 DIAGNOSIS — R079 Chest pain, unspecified: Secondary | ICD-10-CM | POA: Diagnosis not present

## 2021-02-24 LAB — COMPREHENSIVE METABOLIC PANEL
ALT: 61 U/L — ABNORMAL HIGH (ref 0–44)
AST: 164 U/L — ABNORMAL HIGH (ref 15–41)
Albumin: 3.3 g/dL — ABNORMAL LOW (ref 3.5–5.0)
Alkaline Phosphatase: 54 U/L (ref 38–126)
Anion gap: 12 (ref 5–15)
BUN: 24 mg/dL — ABNORMAL HIGH (ref 8–23)
CO2: 22 mmol/L (ref 22–32)
Calcium: 9.3 mg/dL (ref 8.9–10.3)
Chloride: 103 mmol/L (ref 98–111)
Creatinine, Ser: 1.02 mg/dL — ABNORMAL HIGH (ref 0.44–1.00)
GFR, Estimated: 53 mL/min — ABNORMAL LOW (ref >60)
Glucose, Bld: 130 mg/dL — ABNORMAL HIGH (ref 70–99)
Potassium: 4 mmol/L (ref 3.5–5.1)
Sodium: 137 mmol/L (ref 135–145)
Total Bilirubin: 1.4 mg/dL — ABNORMAL HIGH (ref 0.3–1.2)
Total Protein: 6.4 g/dL — ABNORMAL LOW (ref 6.5–8.1)

## 2021-02-24 LAB — CBC WITH DIFFERENTIAL/PLATELET
Abs Immature Granulocytes: 0.13 10*3/uL — ABNORMAL HIGH (ref 0.00–0.07)
Basophils Absolute: 0 10*3/uL (ref 0.0–0.1)
Basophils Relative: 0 %
Eosinophils Absolute: 0 10*3/uL (ref 0.0–0.5)
Eosinophils Relative: 0 %
HCT: 44.8 % (ref 36.0–46.0)
Hemoglobin: 14.8 g/dL (ref 12.0–15.0)
Immature Granulocytes: 1 %
Lymphocytes Relative: 2 %
Lymphs Abs: 0.5 10*3/uL — ABNORMAL LOW (ref 0.7–4.0)
MCH: 27.7 pg (ref 26.0–34.0)
MCHC: 33 g/dL (ref 30.0–36.0)
MCV: 83.7 fL (ref 80.0–100.0)
Monocytes Absolute: 1.5 10*3/uL — ABNORMAL HIGH (ref 0.1–1.0)
Monocytes Relative: 7 %
Neutro Abs: 19 10*3/uL — ABNORMAL HIGH (ref 1.7–7.7)
Neutrophils Relative %: 90 %
Platelets: 432 10*3/uL — ABNORMAL HIGH (ref 150–400)
RBC: 5.35 MIL/uL — ABNORMAL HIGH (ref 3.87–5.11)
RDW: 14 % (ref 11.5–15.5)
WBC: 21.3 10*3/uL — ABNORMAL HIGH (ref 4.0–10.5)
nRBC: 0 % (ref 0.0–0.2)

## 2021-02-24 LAB — TROPONIN I (HIGH SENSITIVITY)
Troponin I (High Sensitivity): 2358 ng/L (ref <18)
Troponin I (High Sensitivity): 2618 ng/L (ref <18)

## 2021-02-24 LAB — RESP PANEL BY RT-PCR (FLU A&B, COVID) ARPGX2
Influenza A by PCR: NEGATIVE
Influenza B by PCR: NEGATIVE
SARS Coronavirus 2 by RT PCR: NEGATIVE

## 2021-02-24 LAB — CK: Total CK: 3898 U/L — ABNORMAL HIGH (ref 38–234)

## 2021-02-24 MED ORDER — LACTATED RINGERS IV BOLUS
1000.0000 mL | Freq: Once | INTRAVENOUS | Status: AC
  Administered 2021-02-24: 1000 mL via INTRAVENOUS

## 2021-02-24 MED ORDER — ACETAMINOPHEN 325 MG PO TABS: 650.0000 mg | ORAL_TABLET | Freq: Four times a day (QID) | ORAL | Status: DC | PRN

## 2021-02-24 MED ORDER — ONDANSETRON HCL 4 MG PO TABS: 4.0000 mg | ORAL_TABLET | Freq: Four times a day (QID) | ORAL | Status: DC | PRN

## 2021-02-24 MED ORDER — ONDANSETRON HCL 4 MG/2ML IJ SOLN: 4.0000 mg | Freq: Four times a day (QID) | INTRAMUSCULAR | Status: DC | PRN

## 2021-02-24 MED ORDER — ASPIRIN 325 MG PO TABS
325.0000 mg | ORAL_TABLET | Freq: Every day | ORAL | Status: DC
  Administered 2021-02-24: 325 mg via ORAL
  Filled 2021-02-24: qty 1

## 2021-02-24 MED ORDER — AMLODIPINE BESYLATE 5 MG PO TABS
2.5000 mg | ORAL_TABLET | Freq: Every day | ORAL | Status: DC
  Administered 2021-02-24: 2.5 mg via ORAL
  Filled 2021-02-24: qty 1

## 2021-02-24 MED ORDER — CALCIUM CARBONATE 1250 (500 CA) MG PO TABS
1.0000 | ORAL_TABLET | Freq: Every day | ORAL | Status: DC
Start: 2021-02-24 — End: 2021-02-28
  Administered 2021-02-24 – 2021-02-28 (×4): 500 mg via ORAL
  Filled 2021-02-24 (×3): qty 1

## 2021-02-24 MED ORDER — ACETAMINOPHEN 650 MG RE SUPP: 650.0000 mg | Freq: Four times a day (QID) | RECTAL | Status: DC | PRN

## 2021-02-24 MED ORDER — HEPARIN BOLUS VIA INFUSION
4000.0000 [IU] | Freq: Once | INTRAVENOUS | Status: AC
  Administered 2021-02-24: 4000 [IU] via INTRAVENOUS
  Filled 2021-02-24: qty 4000

## 2021-02-24 MED ORDER — SENNA 8.6 MG PO TABS
1.0000 | ORAL_TABLET | Freq: Two times a day (BID) | ORAL | Status: DC
  Administered 2021-02-24 – 2021-02-28 (×6): 8.6 mg via ORAL
  Filled 2021-02-24 (×6): qty 1

## 2021-02-24 MED ORDER — DOCUSATE SODIUM 100 MG PO CAPS
100.0000 mg | ORAL_CAPSULE | Freq: Two times a day (BID) | ORAL | Status: DC
  Administered 2021-02-24 – 2021-02-28 (×6): 100 mg via ORAL
  Filled 2021-02-24 (×6): qty 1

## 2021-02-24 MED ORDER — ASCORBIC ACID 500 MG PO TABS
1000.0000 mg | ORAL_TABLET | Freq: Every day | ORAL | Status: DC
  Administered 2021-02-24 – 2021-02-28 (×4): 1000 mg via ORAL
  Filled 2021-02-24 (×3): qty 2

## 2021-02-24 MED ORDER — HEPARIN (PORCINE) 25000 UT/250ML-% IV SOLN
900.0000 [IU]/h | INTRAVENOUS | Status: DC
  Administered 2021-02-24: 850 [IU]/h via INTRAVENOUS
  Administered 2021-02-25: 750 [IU]/h via INTRAVENOUS
  Filled 2021-02-24 (×2): qty 250

## 2021-02-24 MED ORDER — SODIUM CHLORIDE 0.9 % IV SOLN: INTRAVENOUS | Status: DC

## 2021-02-24 NOTE — ED Notes (Signed)
Earlier when ASA was admn PO, pt continuously coughed after having trouble swallowing the pill, EDP was made aware & stated she can keep having water as long as her HOB stays safely elevated. Pt is sleeping at this time & very sleepy, not alert enough to give more PO meds at this time. Will admn crushed in apple sauce if needed once she is awake.

## 2021-02-24 NOTE — ED Triage Notes (Signed)
Pt here d/t fall. Possibly down 3 days. Found by neighbors. A/O X4. Left arm weakness. Lives byself.   20LAC 500 NS 130/50 HR 80 CBG 161 SpO2 RA

## 2021-02-24 NOTE — ED Notes (Signed)
Pt denies pain to this RN, when EDP does exam she confirms pain in Left shoulder (red area seen to the area, & bruising under left FA), pain in bil hips, Right femur & discomfort over the bladder (per EDP) in lower abd.

## 2021-02-24 NOTE — H&P (Signed)
History and Physical    Cathy Silva:[id number] DOB: [birth year] DOA: 02/24/2021  PCP: Cathy Amel, MD   Patient coming from: Home  I have personally briefly reviewed patient's old medical records in [place]  Chief Complaint: S/p fall, unable to get up and remained on the floor for unknown duration.  HPI: Cathy Silva is a 87 y.o. female with medical history significant of hyperlipidemia, hypertension, mitral valve prolapse, history of basal cell carcinoma presented in the ED s/p mechanical fall.  Patient was unable to get up and remained on the floor for unknown duration.  Patient reports she lives by self and yesterday she tripped on the carpet and was unable to get up and remained on the floor.  Neighbors found her on the floor in the morning today and brought her here.  She was alert and oriented but she was not able to get up.  Patient fell on the left shoulder and has developed left shoulder bruise. Patient denies any chest pain, shortness of breath, dizziness, palpitations or prior prodromal symptoms.  Patient denies any fever, sick contacts, recent travel, nausea vomiting, diarrhea, headache.  ED Course: In the ED she was hemodynamically stable except hypertension. HR 90, RR 14, BP 149/69, SPO2 96%, temp 98.4 Labs include: Sodium 137, potassium 4.0, chloride 103, bicarb 22, glucose 130, BUN 24, creatinine 1.02, calcium 9.3, anion gap 12, albumin 3.3, AST 164, ALT 61, total protein 6.4, total bilirubin 1.4, CK 3898, troponin 2358, WBC 21.5 hemoglobin 14.8 hematocrit 48.8, platelet 422. 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 Correlate clinically. No evidence of hemorrhage. Xray left Elbow: Subtle lucency involving the superior corner of the olecranon most concerning for a nondisplaced fracture. Rest of the skeletal survey unremarkable for any fractures or dislocation.    Review of Systems:  Review of Systems   Constitutional: Negative.   HENT: Negative.    Eyes: Negative.   Respiratory: Negative.    Cardiovascular: Negative.   Gastrointestinal: Negative.   Genitourinary: Negative.   Musculoskeletal:  Positive for back pain, falls and neck pain.  Skin: Negative.   Neurological:  Positive for weakness.  Endo/Heme/Allergies:  Bruises/bleeds easily.  Psychiatric/Behavioral: Negative.     Past Medical History:  Diagnosis Date   Allergies    BCC (basal cell carcinoma), arm 2008   Left    Elevated cholesterol    Estrogen deficiency    Fracture    in back   Hx of scarlet fever    Hypercholesteremia    Hyponatremia    Insomnia    Low sodium levels    Melanoma in situ ([place]) 2009   Mitral valve prolapse    Murmur    Psychosocial problem    Thrombocytosis     Past Surgical History:  Procedure Laterality Date   BALLOON DILATION N/A 05/11/2015   Procedure: BALLOON DILATION;  Surgeon: Carol Ada, MD;  Location: [facility] ENDOSCOPY;  Service: Endoscopy;  Laterality: N/A;   DILATION AND CURETTAGE OF UTERUS  05/1984   Prolif   DILATION AND CURETTAGE OF UTERUS  06/1986   benign   ESOPHAGOGASTRODUODENOSCOPY Left 05/11/2015   Procedure: ESOPHAGOGASTRODUODENOSCOPY (EGD);  Surgeon: Carol Ada, MD;  Location: [facility] ENDOSCOPY;  Service: Endoscopy;  Laterality: Left;   EYE SURGERY     bilat cataract   TONSILLECTOMY     TOTAL ABDOMINAL HYSTERECTOMY W/ BILATERAL SALPINGOOPHORECTOMY  05/1987   Leiomyoma     reports that she has never smoked.  She has never used smokeless tobacco. She reports that she does not drink alcohol and does not use drugs.  Allergies  Allergen Reactions   Novocain [Procaine] Shortness Of Breath and Other (See Comments)    "Deathly allergic to this"   Codeine Other (See Comments)    Reaction not recalled by the patient- stated it was "a long time ago"   Pneumococcal Vaccines Other (See Comments)    Reaction not recalled by the patient- stated it was "a long time ago"    Pseudoephedrine Other (See Comments)    Reaction not recalled by the patient- stated it was "a long time ago"   Tetanus Toxoids Swelling and Other (See Comments)    Swelling at injecton site   Tetanus-Diphtheria Toxoids Td Swelling and Other (See Comments)    Swelling where injected    Family History  Problem Relation Age of Onset   Heart attack Maternal Grandmother    CAD Mother    Tuberculosis Father    Hemangiomas Brother     Family history reviewed and not pertinent .  Prior to Admission medications   Medication Sig Start Date End Date Taking? Authorizing Provider  amLODipine (NORVASC) 2.5 MG tablet Take 2.5 mg by mouth daily. 03/13/20  Yes [provider]  Ascorbic Acid (VITAMIN C) 1000 MG tablet Take 1,000 mg by mouth daily.   Yes [provider]  Calcium Carbonate-Vitamin D (CALCIUM + D PO) Take 1,200 mg by mouth.   Yes [provider]  feeding supplement (BOOST / RESOURCE BREEZE) LIQD Take 1 Container by mouth 3 (three) times daily between meals. Patient taking differently: Take 1 Container by mouth daily. 05/12/15  Yes Mikhail, [place], DO  Multiple Vitamins-Minerals (MULTIVITAMIN PO) Take 1 tablet by mouth daily with breakfast.   Yes [provider]  Omega-3 Fatty Acids (FISH OIL) 600 MG CAPS Take 600 mg by mouth in the morning and at bedtime.   Yes [provider]    Physical Exam: Vitals:   02/24/21 1400 02/24/21 1430 02/24/21 1445 02/24/21 1500  BP: (!) 181/79 (!) 154/70 (!) 146/69 (!) 150/102  Pulse: 99 (!) 101 99 94  Resp: (!) 28 (!) 23 (!) 24 (!) 22  SpO2: 99% 96% 97% 97%    Constitutional: Appears comfortable, not in any acute distress. Vitals:   02/24/21 1400 02/24/21 1430 02/24/21 1445 02/24/21 1500  BP: (!) 181/79 (!) 154/70 (!) 146/69 (!) 150/102  Pulse: 99 (!) 101 99 94  Resp: (!) 28 (!) 23 (!) 24 (!) 22  SpO2: 99% 96% 97% 97%   Eyes: PERRL, lids and conjunctivae normal ENMT: Mucous membranes are moist.  Posterior pharynx clear of any exudate or lesions.Normal dentition.  Neck: normal, supple, no masses, no thyromegaly Respiratory: Clear to auscultation bilaterally, no wheezing, no crackles, no accessory muscle use. Cardiovascular: Regular rate and rhythm, murmur +, S1-S2 heard. abdomen: Abdomen is soft, nontender, nondistended, BS+ Musculoskeletal: Shoulder bruising noted, tenderness +  restricted movements,  other extremities normal..  Good ROM, no contractures. Normal muscle tone.  Skin: no rashes, lesions, ulcers. No induration Neurologic: CN 2-12 grossly intact. Sensation intact, DTR normal. Strength 5/5 in all 4.  Psychiatric: Normal judgment and insight. Alert and oriented x 3. Normal mood.   Labs on Admission: I have personally reviewed following labs and imaging studies  CBC: Recent Labs  Lab 02/24/21 1107  WBC 21.3*  NEUTROABS 19.0*  HGB 14.8  HCT 44.8  MCV 83.7  PLT 432*   Basic  Metabolic Panel: Recent Labs  Lab 02/24/21 1107  NA 137  K 4.0  CL 103  CO2 22  GLUCOSE 130*  BUN 24*  CREATININE 1.02*  CALCIUM 9.3   GFR: CrCl cannot be calculated (Unknown ideal weight.). Liver Function Tests: Recent Labs  Lab 02/24/21 1107  AST 164*  ALT 61*  ALKPHOS 54  BILITOT 1.4*  PROT 6.4*  ALBUMIN 3.3*   No results for input(s): LIPASE, AMYLASE in the last 168 hours. No results for input(s): AMMONIA in the last 168 hours. Coagulation Profile: No results for input(s): INR, PROTIME in the last 168 hours. Cardiac Enzymes: Recent Labs  Lab 02/24/21 1107  CKTOTAL 3,898*   BNP (last 3 results) No results for input(s): PROBNP in the last 8760 hours. HbA1C: No results for input(s): HGBA1C in the last 72 hours. CBG: No results for input(s): GLUCAP in the last 168 hours. Lipid Profile: No results for input(s): CHOL, HDL, LDLCALC, TRIG, CHOLHDL, LDLDIRECT in the last 72 hours. Thyroid Function Tests: No results for input(s): TSH, T4TOTAL, FREET4, T3FREE, THYROIDAB  in the last 72 hours. Anemia Panel: No results for input(s): VITAMINB12, FOLATE, FERRITIN, TIBC, IRON, RETICCTPCT in the last 72 hours. Urine analysis:    Component Value Date/Time   COLORURINE YELLOW 05/07/2015 [place] 05/07/2015 1449   LABSPEC 1.029 05/07/2015 1449   PHURINE 7.0 05/07/2015 1449   GLUCOSEU NEGATIVE 05/07/2015 1449   HGBUR SMALL (A) 05/07/2015 1449   BILIRUBINUR NEGATIVE 05/07/2015 1449   KETONESUR 40 (A) 05/07/2015 1449   PROTEINUR 100 (A) 05/07/2015 1449   NITRITE NEGATIVE 05/07/2015 1449   LEUKOCYTESUR NEGATIVE 05/07/2015 1449    Radiological Exams on Admission: DG Elbow Complete Left  Result Date: 02/24/2021 CLINICAL DATA:  Status post fall, elbow pain EXAM: LEFT ELBOW - COMPLETE 3+ VIEW COMPARISON:  None. FINDINGS: Subtle lucency involving the superior corner of the olecranon most concerning for a nondisplaced fracture. No other fracture or dislocation. No aggressive osseous lesion. Normal alignment. Soft tissue are unremarkable. No radiopaque foreign body or soft tissue emphysema. IMPRESSION: 1. Subtle lucency involving the superior corner of the olecranon most concerning for a nondisplaced fracture. Electronically Signed   By: Kathreen Devoid M.D.   On: 02/24/2021 12:44   CT Head Wo Contrast  Result Date: 02/24/2021 CLINICAL DATA:  87 year old female with acute fall and head injury. Initial encounter. EXAM: CT HEAD WITHOUT CONTRAST TECHNIQUE: Contiguous axial images were obtained from the base of the skull through the vertex without intravenous contrast. COMPARISON:  None. FINDINGS: Brain: Obscuration of a portion of the RIGHT insular cortex is noted (series 3: Image 15-16) compatible with an infarct of uncertain chronicity, but appears more remote. No evidence of hemorrhage, hydrocephalus, extra-axial collection or mass lesion/mass effect. Atrophy identified. Mild periventricular white matter hypodensities likely represent chronic small vessel white  matter ischemic changes. Vascular: Carotid and vertebral atherosclerotic calcifications are noted. Skull: No acute abnormality. Sinuses/Orbits: No acute abnormality Other: None IMPRESSION: 1. 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 Correlate clinically. No evidence of hemorrhage. 2. Atrophy and probable chronic small vessel white matter ischemic changes. Electronically Signed   By: Margarette Canada M.D.   On: 02/24/2021 12:01   DG Shoulder Left  Result Date: 02/24/2021 CLINICAL DATA:  Status post fall, shoulder pain EXAM: LEFT SHOULDER - 2+ VIEW COMPARISON:  None. FINDINGS: No acute fracture or dislocation. No aggressive osseous lesion. Normal alignment. Generalized osteopenia. Mild osteoarthritis of the glenohumeral joint and  acromioclavicular joint. Soft tissue are unremarkable. No radiopaque foreign body or soft tissue emphysema. IMPRESSION: No acute osseous injury of the left shoulder. Electronically Signed   By: Kathreen Devoid M.D.   On: 02/24/2021 12:45   DG Hip Unilat With Pelvis 2-3 Views Left  Result Date: 02/24/2021 CLINICAL DATA:  Status post fall, bilateral hip pain EXAM: DG HIP (WITH OR WITHOUT PELVIS) 2-3V RIGHT; LEFT FEMUR 2 VIEWS; RIGHT FEMUR 2 VIEWS; DG HIP (WITH OR WITHOUT PELVIS) 2-3V LEFT COMPARISON:  None. FINDINGS: No acute fracture or dislocation. No aggressive osseous lesion. Normal alignment. Generalized osteopenia. Mild osteoarthritis of the hips bilaterally. Moderate left medial femorotibial compartment osteoarthritis. Mild osteoarthritis of the right medial and lateral femorotibial compartments as well as the left lateral femorotibial compartment. Soft tissue are unremarkable. No radiopaque foreign body or soft tissue emphysema. Peripheral vascular atherosclerotic disease. IMPRESSION: 1.  No acute osseous injury of the right hip. 2.  No acute osseous injury of the left hip. 3.  No acute osseous injury of the right femur. 4.  No acute osseous injury  of the left femur. 5. Given the patient's age and osteopenia, if there is persistent clinical concern for an occult hip fracture, a MRI of the hip is recommended for increased sensitivity. Electronically Signed   By: Kathreen Devoid M.D.   On: 02/24/2021 12:52   DG Hip Unilat With Pelvis 2-3 Views Right  Result Date: 02/24/2021 CLINICAL DATA:  Status post fall, bilateral hip pain EXAM: DG HIP (WITH OR WITHOUT PELVIS) 2-3V RIGHT; LEFT FEMUR 2 VIEWS; RIGHT FEMUR 2 VIEWS; DG HIP (WITH OR WITHOUT PELVIS) 2-3V LEFT COMPARISON:  None. FINDINGS: No acute fracture or dislocation. No aggressive osseous lesion. Normal alignment. Generalized osteopenia. Mild osteoarthritis of the hips bilaterally. Moderate left medial femorotibial compartment osteoarthritis. Mild osteoarthritis of the right medial and lateral femorotibial compartments as well as the left lateral femorotibial compartment. Soft tissue are unremarkable. No radiopaque foreign body or soft tissue emphysema. Peripheral vascular atherosclerotic disease. IMPRESSION: 1.  No acute osseous injury of the right hip. 2.  No acute osseous injury of the left hip. 3.  No acute osseous injury of the right femur. 4.  No acute osseous injury of the left femur. 5. Given the patient's age and osteopenia, if there is persistent clinical concern for an occult hip fracture, a MRI of the hip is recommended for increased sensitivity. Electronically Signed   By: Kathreen Devoid M.D.   On: 02/24/2021 12:52   DG FEMUR MIN 2 VIEWS LEFT  Result Date: 02/24/2021 CLINICAL DATA:  Status post fall, bilateral hip pain EXAM: DG HIP (WITH OR WITHOUT PELVIS) 2-3V RIGHT; LEFT FEMUR 2 VIEWS; RIGHT FEMUR 2 VIEWS; DG HIP (WITH OR WITHOUT PELVIS) 2-3V LEFT COMPARISON:  None. FINDINGS: No acute fracture or dislocation. No aggressive osseous lesion. Normal alignment. Generalized osteopenia. Mild osteoarthritis of the hips bilaterally. Moderate left medial femorotibial compartment osteoarthritis. Mild  osteoarthritis of the right medial and lateral femorotibial compartments as well as the left lateral femorotibial compartment. Soft tissue are unremarkable. No radiopaque foreign body or soft tissue emphysema. Peripheral vascular atherosclerotic disease. IMPRESSION: 1.  No acute osseous injury of the right hip. 2.  No acute osseous injury of the left hip. 3.  No acute osseous injury of the right femur. 4.  No acute osseous injury of the left femur. 5. Given the patient's age and osteopenia, if there is persistent clinical concern for an occult hip fracture, a MRI of the hip  is recommended for increased sensitivity. Electronically Signed   By: Kathreen Devoid M.D.   On: 02/24/2021 12:52   DG FEMUR, MIN 2 VIEWS RIGHT  Result Date: 02/24/2021 CLINICAL DATA:  Status post fall, bilateral hip pain EXAM: DG HIP (WITH OR WITHOUT PELVIS) 2-3V RIGHT; LEFT FEMUR 2 VIEWS; RIGHT FEMUR 2 VIEWS; DG HIP (WITH OR WITHOUT PELVIS) 2-3V LEFT COMPARISON:  None. FINDINGS: No acute fracture or dislocation. No aggressive osseous lesion. Normal alignment. Generalized osteopenia. Mild osteoarthritis of the hips bilaterally. Moderate left medial femorotibial compartment osteoarthritis. Mild osteoarthritis of the right medial and lateral femorotibial compartments as well as the left lateral femorotibial compartment. Soft tissue are unremarkable. No radiopaque foreign body or soft tissue emphysema. Peripheral vascular atherosclerotic disease. IMPRESSION: 1.  No acute osseous injury of the right hip. 2.  No acute osseous injury of the left hip. 3.  No acute osseous injury of the right femur. 4.  No acute osseous injury of the left femur. 5. Given the patient's age and osteopenia, if there is persistent clinical concern for an occult hip fracture, a MRI of the hip is recommended for increased sensitivity. Electronically Signed   By: Kathreen Devoid M.D.   On: 02/24/2021 12:52    EKG: Independently reviewed.  Sinus rhythm,  RBBB  Assessment/Plan Principal Problem:   Rhabdomyolysis Active Problems:   Essential hypertension   Back pain   Nonrheumatic aortic valve stenosis   Hyperlipidemia   Rhabdomyolysis s/p mechanical fall. Patient presented s/p fall, unable to get up,  remain on the floor for unknown duration. Patient denies any LOC, prodromal symptoms before fall, head trauma CK 3898, Trop 2358.  Continue to trend CK. Patient received 2 L of LR , continue IV gentle hydration. Serum creatinine normal, continue to monitor. skeletal work-up unremarkable.  Elevated troponin could be secondary to fall. NSTEMI: Patient presented s/p fall with elevated troponins. Continue heparin gtt, continue to trend troponins. Patient denies any chest pain, EKG shows RBBB,  No ST-T wave changes. Cardiology consulted,  recommended to continue aspirin and heparin.  Elevated liver enzymes: This could be secondary to fall Continue to trend liver enzymes.  Leukocytosis: Could be reactive. There is no signs of any infection. Continue to trend.  Heart murmur: Obtain 2D echocardiogram.  Left shoulder  pain: X-ray concerning for nondisplaced fracture Continue pain control.    DVT prophylaxis: Heparin gtt Code Status:  Full code. Family Communication: Dtr at bed side.  Disposition Plan:   Inpatient   Consults called:  Cardio [place]. Admission status: Inpatient   Shawna Clamp MD [facility]   If 7PM-7AM, please contact night-coverage   02/24/2021, 3:08 PM

## 2021-02-24 NOTE — ED Notes (Signed)
Paged MD reference bladder scan result

## 2021-02-24 NOTE — ED Notes (Signed)
EDP has been made aware of pt's critical troponin.

## 2021-02-24 NOTE — ED Notes (Signed)
Received verbal report from Brittany O RN at this time 

## 2021-02-24 NOTE — Progress Notes (Signed)
ANTICOAGULATION CONSULT NOTE - Initial Consult  Pharmacy Consult for heparin Indication: chest pain/ACS  Allergies  Allergen Reactions   Novocain [Procaine] Shortness Of Breath and Other (See Comments)    "Deathly allergic to this"   Codeine Other (See Comments)    Reaction not recalled by the patient- stated it was "a long time ago"   Pneumococcal Vaccines Other (See Comments)    Reaction not recalled by the patient- stated it was "a long time ago"   Pseudoephedrine Other (See Comments)    Reaction not recalled by the patient- stated it was "a long time ago"   Tetanus Toxoids Swelling and Other (See Comments)    Swelling at injecton site   Tetanus-Diphtheria Toxoids Td Swelling and Other (See Comments)    Swelling where injected    Patient Measurements:   Heparin Dosing Weight: 65 kg   Vital Signs: BP: 181/79 (10/29 1400) Pulse Rate: 99 (10/29 1400)  Labs: Recent Labs    02/24/21 1107  HGB 14.8  HCT 44.8  PLT 432*  CREATININE 1.02*  CKTOTAL 3,898*  TROPONINIHS 2,358*    CrCl cannot be calculated (Unknown ideal weight.).   Medical History: Past Medical History:  Diagnosis Date   Allergies    BCC (basal cell carcinoma), arm 2008   Left    Elevated cholesterol    Estrogen deficiency    Fracture    in back   Hx of scarlet fever    Hypercholesteremia    Hyponatremia    Insomnia    Low sodium levels    Melanoma in situ ([place]) 2009   Mitral valve prolapse    Murmur    Psychosocial problem    Thrombocytosis     Medications:  (Not in a hospital admission)   Assessment: 84 YOF who presents with pain in L shoulder found to have elevate troponin. Pharmacy consulted to start IV heparin. H/H wnl, Plt elevated. SCr mildly elevated   Goal of Therapy:  Heparin level 0.3-0.7 units/ml Monitor platelets by anticoagulation protocol: Yes   Plan:  -Heparin 4000 units IV bolus followed by heparin infusion at 850 units/hr -F/u 8 hr anti-Xa level -Monitor daily  anti-Xa level, CBC and s/s of bleeding   Albertina Parr, PharmD., BCPS, BCCCP Clinical Pharmacist Please refer to [facility] for unit-specific pharmacist

## 2021-02-24 NOTE — ED Provider Notes (Signed)
[facility]ERGENCY DEPARTMENT Provider Note   CSN: [id number] Arrival date & time: 02/24/21  1055     History Chief Complaint  Patient presents with   Cathy Silva is a 87 y.o. female who presents to the emergency department after being found down for an unknown amount of time.  Patient states that she fell and has been on the ground for 4 days.  The patient's daughter states that she was seen yesterday walking and may have been down for approximately 24 hours.  Patient unsure of loss of consciousness.  She states that she has not been able to use the restroom since she has been on the ground.  She currently denies chest pain, shortness of breath, abdominal pain, nausea, vomiting or other systemic symptoms.  She does endorse left shoulder pain bilateral hip and femur pain, elbow pain.  She is alert and oriented answering all questions appropriately.   Fall Pertinent negatives include no chest pain, no abdominal pain and no shortness of breath.      Past Medical History:  Diagnosis Date   Allergies    BCC (basal cell carcinoma), arm 2008   Left    Elevated cholesterol    Estrogen deficiency    Fracture    in back   Hx of scarlet fever    Hypercholesteremia    Hyponatremia    Insomnia    Low sodium levels    Melanoma in situ ([place]) 2009   Mitral valve prolapse    Murmur    Psychosocial problem    Thrombocytosis     Patient Active Problem List   Diagnosis Date Noted   Rhabdomyolysis 02/24/2021   Nonrheumatic aortic valve stenosis 06/26/2020   Aortic atherosclerosis ([place]) 06/26/2020   Hyperlipidemia 06/26/2020   RBBB 06/26/2020   Hyponatremia 05/07/2015   Essential hypertension 05/07/2015   Nausea & vomiting 05/07/2015   Back pain 05/07/2015   Mid back pain     Past Surgical History:  Procedure Laterality Date   BALLOON DILATION N/A 05/11/2015   Procedure: BALLOON DILATION;  Surgeon: Carol Ada, MD;  Location: [facility];   Service: Endoscopy;  Laterality: N/A;   DILATION AND CURETTAGE OF UTERUS  05/1984   Prolif   DILATION AND CURETTAGE OF UTERUS  06/1986   benign   ESOPHAGOGASTRODUODENOSCOPY Left 05/11/2015   Procedure: ESOPHAGOGASTRODUODENOSCOPY (EGD);  Surgeon: Carol Ada, MD;  Location[facility];  Service: Endoscopy;  Laterality: Left;   EYE SURGERY     bilat cataract   TONSILLECTOMY     TOTAL ABDOMINAL HYSTERECTOMY W/ BILATERAL SALPINGOOPHORECTOMY  05/1987   Leiomyoma     OB History     Gravida  0   Para  0   Term  0   Preterm  0   AB  0   Living  0      SAB  0   IAB  0   Ectopic  0   Multiple  0   Live Births              Family History  Problem Relation Age of Onset   Heart attack Maternal Grandmother    CAD Mother    Tuberculosis Father    Hemangiomas Brother     Social History   Tobacco Use   Smoking status: Never   Smokeless tobacco: Never  Substance Use Topics   Alcohol use: No    Alcohol/week: 0.0 standard drinks   Drug use:  No    Home Medications Prior to Admission medications   Medication Sig Start Date End Date Taking? Authorizing Provider  amLODipine (NORVASC) 2.5 MG tablet Take 2.5 mg by mouth daily. 03/13/20  Yes [provider]  Ascorbic Acid (VITAMIN C) 1000 MG tablet Take 1,000 mg by mouth daily.   Yes [provider]  Calcium Carbonate-Vitamin D (CALCIUM + D PO) Take 1,200 mg by mouth.   Yes [provider]  feeding supplement (BOOST / RESOURCE BREEZE) LIQD Take 1 Container by mouth 3 (three) times daily between meals. Patient taking differently: Take 1 Container by mouth daily. 05/12/15  Yes Mikhail, [place], DO  Multiple Vitamins-Minerals (MULTIVITAMIN PO) Take 1 tablet by mouth daily with breakfast.   Yes [provider]  Omega-3 Fatty Acids (FISH OIL) 600 MG CAPS Take 600 mg by mouth in the morning and at bedtime.   Yes [provider]    Allergies    Novocain [procaine], Codeine,  Pneumococcal vaccines, Pseudoephedrine, Tetanus toxoids, and Tetanus-diphtheria toxoids td  Review of Systems   Review of Systems  Constitutional:  Negative for chills and fever.  HENT:  Negative for ear pain and sore throat.   Eyes:  Negative for pain and visual disturbance.  Respiratory:  Negative for cough and shortness of breath.   Cardiovascular:  Negative for chest pain and palpitations.  Gastrointestinal:  Negative for abdominal pain and vomiting.  Genitourinary:  Negative for dysuria and hematuria.  Musculoskeletal:  Positive for arthralgias and myalgias. Negative for back pain.  Skin:  Negative for color change and rash.  Neurological:  Negative for seizures and syncope.  All other systems reviewed and are negative.  Physical Exam Updated Vital Signs BP (!) 150/102   Pulse 94   Resp (!) 22   LMP 05/31/1987   SpO2 97%   Physical Exam Vitals and nursing note reviewed.  Constitutional:      General: She is not in acute distress.    Appearance: She is well-developed.  HENT:     Head: Normocephalic and atraumatic.  Eyes:     Conjunctiva/sclera: Conjunctivae normal.  Cardiovascular:     Rate and Rhythm: Normal rate and regular rhythm.     Heart sounds: No murmur heard. Pulmonary:     Effort: Pulmonary effort is normal. No respiratory distress.     Breath sounds: Normal breath sounds.  Abdominal:     Palpations: Abdomen is soft.     Tenderness: There is no abdominal tenderness.  Musculoskeletal:        General: Tenderness (Bilateral hips, bilateral femur, elbows, shoulder) present.     Cervical back: Neck supple.  Skin:    General: Skin is warm and dry.  Neurological:     Mental Status: She is alert.    ED Results / Procedures / Treatments   Labs (all labs ordered are listed, but only abnormal results are displayed) Labs Reviewed  COMPREHENSIVE METABOLIC PANEL - Abnormal; Notable for the following components:      Result Value   Glucose, Bld 130 (*)    BUN  24 (*)    Creatinine, Ser 1.02 (*)    Total Protein 6.4 (*)    Albumin 3.3 (*)    AST 164 (*)    ALT 61 (*)    Total Bilirubin 1.4 (*)    GFR, Estimated 53 (*)    All other components within normal limits  CBC WITH DIFFERENTIAL/PLATELET - Abnormal; Notable for the following components:  WBC 21.3 (*)    RBC 5.35 (*)    Platelets 432 (*)    Neutro Abs 19.0 (*)    Lymphs Abs 0.5 (*)    Monocytes Absolute 1.5 (*)    Abs Immature Granulocytes 0.13 (*)    All other components within normal limits  CK - Abnormal; Notable for the following components:   Total CK 3,898 (*)    All other components within normal limits  TROPONIN I (HIGH SENSITIVITY) - Abnormal; Notable for the following components:   Troponin I (High Sensitivity) 2,358 (*)    All other components within normal limits  URINE CULTURE  URINALYSIS, ROUTINE W REFLEX MICROSCOPIC  RAPID URINE DRUG SCREEN, HOSP PERFORMED  HEPARIN LEVEL (UNFRACTIONATED)  TROPONIN I (HIGH SENSITIVITY)    EKG EKG Interpretation  Date/Time:  Saturday February 24 2021 11:14:33 EDT Ventricular Rate:  93 PR Interval:  174 QRS Duration: 134 QT Interval:  391 QTC Calculation: 487 R Axis:   78 Text Interpretation: Sinus rhythm Left atrial enlargement Right bundle branch block Confirmed by [place] ([id number]) on 02/24/2021 1:27:10 PM  Radiology DG Elbow Complete Left  Result Date: 02/24/2021 CLINICAL DATA:  Status post fall, elbow pain EXAM: LEFT ELBOW - COMPLETE 3+ VIEW COMPARISON:  None. FINDINGS: Subtle lucency involving the superior corner of the olecranon most concerning for a nondisplaced fracture. No other fracture or dislocation. No aggressive osseous lesion. Normal alignment. Soft tissue are unremarkable. No radiopaque foreign body or soft tissue emphysema. IMPRESSION: 1. Subtle lucency involving the superior corner of the olecranon most concerning for a nondisplaced fracture. Electronically Signed   By: Kathreen Devoid M.D.   On: 02/24/2021  12:44   CT Head Wo Contrast  Result Date: 02/24/2021 CLINICAL DATA:  87 year old female with acute fall and head injury. Initial encounter. EXAM: CT HEAD WITHOUT CONTRAST TECHNIQUE: Contiguous axial images were obtained from the base of the skull through the vertex without intravenous contrast. COMPARISON:  None. FINDINGS: Brain: Obscuration of a portion of the RIGHT insular cortex is noted (series 3: Image 15-16) compatible with an infarct of uncertain chronicity, but appears more remote. No evidence of hemorrhage, hydrocephalus, extra-axial collection or mass lesion/mass effect. Atrophy identified. Mild periventricular white matter hypodensities likely represent chronic small vessel white matter ischemic changes. Vascular: Carotid and vertebral atherosclerotic calcifications are noted. Skull: No acute abnormality. Sinuses/Orbits: No acute abnormality Other: None IMPRESSION: 1. 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 Correlate clinically. No evidence of hemorrhage. 2. Atrophy and probable chronic small vessel white matter ischemic changes. Electronically Signed   By: Margarette Canada M.D.   On: 02/24/2021 12:01   DG Shoulder Left  Result Date: 02/24/2021 CLINICAL DATA:  Status post fall, shoulder pain EXAM: LEFT SHOULDER - 2+ VIEW COMPARISON:  None. FINDINGS: No acute fracture or dislocation. No aggressive osseous lesion. Normal alignment. Generalized osteopenia. Mild osteoarthritis of the glenohumeral joint and acromioclavicular joint. Soft tissue are unremarkable. No radiopaque foreign body or soft tissue emphysema. IMPRESSION: No acute osseous injury of the left shoulder. Electronically Signed   By: Kathreen Devoid M.D.   On: 02/24/2021 12:45   DG Hip Unilat With Pelvis 2-3 Views Left  Result Date: 02/24/2021 CLINICAL DATA:  Status post fall, bilateral hip pain EXAM: DG HIP (WITH OR WITHOUT PELVIS) 2-3V RIGHT; LEFT FEMUR 2 VIEWS; RIGHT FEMUR 2 VIEWS; DG HIP (WITH OR  WITHOUT PELVIS) 2-3V LEFT COMPARISON:  None. FINDINGS: No acute fracture or dislocation. No aggressive osseous lesion.  Normal alignment. Generalized osteopenia. Mild osteoarthritis of the hips bilaterally. Moderate left medial femorotibial compartment osteoarthritis. Mild osteoarthritis of the right medial and lateral femorotibial compartments as well as the left lateral femorotibial compartment. Soft tissue are unremarkable. No radiopaque foreign body or soft tissue emphysema. Peripheral vascular atherosclerotic disease. IMPRESSION: 1.  No acute osseous injury of the right hip. 2.  No acute osseous injury of the left hip. 3.  No acute osseous injury of the right femur. 4.  No acute osseous injury of the left femur. 5. Given the patient's age and osteopenia, if there is persistent clinical concern for an occult hip fracture, a MRI of the hip is recommended for increased sensitivity. Electronically Signed   By: Kathreen Devoid M.D.   On: 02/24/2021 12:52   DG Hip Unilat With Pelvis 2-3 Views Right  Result Date: 02/24/2021 CLINICAL DATA:  Status post fall, bilateral hip pain EXAM: DG HIP (WITH OR WITHOUT PELVIS) 2-3V RIGHT; LEFT FEMUR 2 VIEWS; RIGHT FEMUR 2 VIEWS; DG HIP (WITH OR WITHOUT PELVIS) 2-3V LEFT COMPARISON:  None. FINDINGS: No acute fracture or dislocation. No aggressive osseous lesion. Normal alignment. Generalized osteopenia. Mild osteoarthritis of the hips bilaterally. Moderate left medial femorotibial compartment osteoarthritis. Mild osteoarthritis of the right medial and lateral femorotibial compartments as well as the left lateral femorotibial compartment. Soft tissue are unremarkable. No radiopaque foreign body or soft tissue emphysema. Peripheral vascular atherosclerotic disease. IMPRESSION: 1.  No acute osseous injury of the right hip. 2.  No acute osseous injury of the left hip. 3.  No acute osseous injury of the right femur. 4.  No acute osseous injury of the left femur. 5. Given the patient's  age and osteopenia, if there is persistent clinical concern for an occult hip fracture, a MRI of the hip is recommended for increased sensitivity. Electronically Signed   By: Kathreen Devoid M.D.   On: 02/24/2021 12:52   DG FEMUR MIN 2 VIEWS LEFT  Result Date: 02/24/2021 CLINICAL DATA:  Status post fall, bilateral hip pain EXAM: DG HIP (WITH OR WITHOUT PELVIS) 2-3V RIGHT; LEFT FEMUR 2 VIEWS; RIGHT FEMUR 2 VIEWS; DG HIP (WITH OR WITHOUT PELVIS) 2-3V LEFT COMPARISON:  None. FINDINGS: No acute fracture or dislocation. No aggressive osseous lesion. Normal alignment. Generalized osteopenia. Mild osteoarthritis of the hips bilaterally. Moderate left medial femorotibial compartment osteoarthritis. Mild osteoarthritis of the right medial and lateral femorotibial compartments as well as the left lateral femorotibial compartment. Soft tissue are unremarkable. No radiopaque foreign body or soft tissue emphysema. Peripheral vascular atherosclerotic disease. IMPRESSION: 1.  No acute osseous injury of the right hip. 2.  No acute osseous injury of the left hip. 3.  No acute osseous injury of the right femur. 4.  No acute osseous injury of the left femur. 5. Given the patient's age and osteopenia, if there is persistent clinical concern for an occult hip fracture, a MRI of the hip is recommended for increased sensitivity. Electronically Signed   By: Kathreen Devoid M.D.   On: 02/24/2021 12:52   DG FEMUR, MIN 2 VIEWS RIGHT  Result Date: 02/24/2021 CLINICAL DATA:  Status post fall, bilateral hip pain EXAM: DG HIP (WITH OR WITHOUT PELVIS) 2-3V RIGHT; LEFT FEMUR 2 VIEWS; RIGHT FEMUR 2 VIEWS; DG HIP (WITH OR WITHOUT PELVIS) 2-3V LEFT COMPARISON:  None. FINDINGS: No acute fracture or dislocation. No aggressive osseous lesion. Normal alignment. Generalized osteopenia. Mild osteoarthritis of the hips bilaterally. Moderate left medial femorotibial compartment osteoarthritis. Mild osteoarthritis of the right  medial and lateral  femorotibial compartments as well as the left lateral femorotibial compartment. Soft tissue are unremarkable. No radiopaque foreign body or soft tissue emphysema. Peripheral vascular atherosclerotic disease. IMPRESSION: 1.  No acute osseous injury of the right hip. 2.  No acute osseous injury of the left hip. 3.  No acute osseous injury of the right femur. 4.  No acute osseous injury of the left femur. 5. Given the patient's age and osteopenia, if there is persistent clinical concern for an occult hip fracture, a MRI of the hip is recommended for increased sensitivity. Electronically Signed   By: Kathreen Devoid M.D.   On: 02/24/2021 12:52    Procedures Procedures   Medications Ordered in ED Medications  aspirin tablet 325 mg (325 mg Oral Given 02/24/21 1358)  heparin ADULT infusion 100 units/mL (25000 units/267mL) (850 Units/hr Intravenous New Bag/Given 02/24/21 1501)  amLODipine (NORVASC) tablet 2.5 mg (has no administration in time range)  ascorbic acid (VITAMIN C) tablet 1,000 mg (has no administration in time range)  calcium carbonate (OS-CAL - dosed in mg of elemental calcium) tablet 500 mg of elemental calcium (has no administration in time range)  0.9 %  sodium chloride infusion (has no administration in time range)  acetaminophen (TYLENOL) tablet 650 mg (has no administration in time range)    Or  acetaminophen (TYLENOL) suppository 650 mg (has no administration in time range)  docusate sodium (COLACE) capsule 100 mg (has no administration in time range)  senna (SENOKOT) tablet 8.6 mg (has no administration in time range)  ondansetron (ZOFRAN) tablet 4 mg (has no administration in time range)    Or  ondansetron (ZOFRAN) injection 4 mg (has no administration in time range)  lactated ringers bolus 1,000 mL (0 mLs Intravenous Stopped 02/24/21 1551)  heparin bolus via infusion 4,000 Units (4,000 Units Intravenous Bolus from Bag 02/24/21 1504)    ED Course  I have reviewed the triage vital  signs and the nursing notes.  Pertinent labs & imaging results that were available during my care of the patient were reviewed by me and considered in my medical decision making (see chart for details).    MDM Rules/Calculators/A&P                           Patient seen emergency department for evaluation of a fall.  Physical exam reveals tenderness over multiple joints as described above.  There appears to be erythema over the left shoulder and over the left knee consistent with the beginning stages of a pressure ulcer from being down.  Laboratory evaluation with a significant leukocytosis to 21.3, AST 164, ALT 61, troponin elevated to 2358, CK 3898.  ECG nonischemic.  Patient started on fluid resuscitation and cardiology was consulted who believes her troponin leak is likely from the early stages of rhabdomyolysis.  Recommending repeat Trope.  Trauma imaging unremarkable.  Patient started on a heparin drip and given aspirin and admitted to medicine. Final Clinical Impression(s) / ED Diagnoses Final diagnoses:  Fall    Rx / DC Orders ED Discharge Orders     None        Shanikka Wonders, Debe Coder, MD 02/24/21 [id number]

## 2021-02-24 NOTE — ED Notes (Signed)
Pt was unable to urinate on a bed pan.

## 2021-02-25 ENCOUNTER — Inpatient Hospital Stay (HOSPITAL_COMMUNITY): Payer: Medicare Other

## 2021-02-25 DIAGNOSIS — R079 Chest pain, unspecified: Secondary | ICD-10-CM | POA: Diagnosis not present

## 2021-02-25 DIAGNOSIS — T796XXA Traumatic ischemia of muscle, initial encounter: Secondary | ICD-10-CM | POA: Diagnosis not present

## 2021-02-25 LAB — URINALYSIS, ROUTINE W REFLEX MICROSCOPIC
Bilirubin Urine: NEGATIVE
Glucose, UA: NEGATIVE mg/dL
Ketones, ur: 5 mg/dL — AB
Nitrite: NEGATIVE
Protein, ur: 100 mg/dL — AB
Specific Gravity, Urine: 1.02 (ref 1.005–1.030)
WBC, UA: >50 WBC/hpf — ABNORMAL HIGH (ref 0–5)
pH: 5 (ref 5.0–8.0)

## 2021-02-25 LAB — COMPREHENSIVE METABOLIC PANEL
ALT: 53 U/L — ABNORMAL HIGH (ref 0–44)
AST: 117 U/L — ABNORMAL HIGH (ref 15–41)
Albumin: 2.5 g/dL — ABNORMAL LOW (ref 3.5–5.0)
Alkaline Phosphatase: 42 U/L (ref 38–126)
Anion gap: 11 (ref 5–15)
BUN: 26 mg/dL — ABNORMAL HIGH (ref 8–23)
CO2: 20 mmol/L — ABNORMAL LOW (ref 22–32)
Calcium: 8.5 mg/dL — ABNORMAL LOW (ref 8.9–10.3)
Chloride: 103 mmol/L (ref 98–111)
Creatinine, Ser: 0.79 mg/dL (ref 0.44–1.00)
GFR, Estimated: >60 mL/min (ref >60)
Glucose, Bld: 98 mg/dL (ref 70–99)
Potassium: 3.7 mmol/L (ref 3.5–5.1)
Sodium: 134 mmol/L — ABNORMAL LOW (ref 135–145)
Total Bilirubin: 1.2 mg/dL (ref 0.3–1.2)
Total Protein: 5 g/dL — ABNORMAL LOW (ref 6.5–8.1)

## 2021-02-25 LAB — ECHOCARDIOGRAM COMPLETE
AV Mean grad: 21 mmHg
AV Peak grad: 40.4 mmHg
Ao pk vel: 3.18 m/s
Height: 66 in
S' Lateral: 2.2 cm
Weight: 2240 oz

## 2021-02-25 LAB — RAPID URINE DRUG SCREEN, HOSP PERFORMED
Amphetamines: NOT DETECTED
Barbiturates: NOT DETECTED
Benzodiazepines: NOT DETECTED
Cocaine: NOT DETECTED
Opiates: NOT DETECTED
Tetrahydrocannabinol: NOT DETECTED

## 2021-02-25 LAB — CBC
HCT: 36.7 % (ref 36.0–46.0)
Hemoglobin: 12.1 g/dL (ref 12.0–15.0)
MCH: 27.6 pg (ref 26.0–34.0)
MCHC: 33 g/dL (ref 30.0–36.0)
MCV: 83.6 fL (ref 80.0–100.0)
Platelets: 346 10*3/uL (ref 150–400)
RBC: 4.39 MIL/uL (ref 3.87–5.11)
RDW: 14 % (ref 11.5–15.5)
WBC: 18.1 10*3/uL — ABNORMAL HIGH (ref 4.0–10.5)
nRBC: 0 % (ref 0.0–0.2)

## 2021-02-25 LAB — MAGNESIUM: Magnesium: 2.1 mg/dL (ref 1.7–2.4)

## 2021-02-25 LAB — HEPARIN LEVEL (UNFRACTIONATED)
Heparin Unfractionated: 0.33 IU/mL (ref 0.30–0.70)
Heparin Unfractionated: 0.72 IU/mL — ABNORMAL HIGH (ref 0.30–0.70)

## 2021-02-25 LAB — CK: Total CK: 1718 U/L — ABNORMAL HIGH (ref 38–234)

## 2021-02-25 LAB — PHOSPHORUS: Phosphorus: 3.2 mg/dL (ref 2.5–4.6)

## 2021-02-25 LAB — TROPONIN I (HIGH SENSITIVITY): Troponin I (High Sensitivity): 1597 ng/L (ref <18)

## 2021-02-25 MED ORDER — AMLODIPINE BESYLATE 5 MG PO TABS
5.0000 mg | ORAL_TABLET | Freq: Every day | ORAL | Status: DC
  Administered 2021-02-26 – 2021-02-28 (×3): 5 mg via ORAL
  Filled 2021-02-25 (×2): qty 1

## 2021-02-25 MED ORDER — SODIUM CHLORIDE 0.9 % IV SOLN
1.0000 g | Freq: Every day | INTRAVENOUS | Status: DC
  Administered 2021-02-25 – 2021-02-28 (×4): 1 g via INTRAVENOUS
  Filled 2021-02-25 (×5): qty 10

## 2021-02-25 NOTE — ED Notes (Signed)
Glory Rosebush niece [phone] call with any changes

## 2021-02-25 NOTE — Progress Notes (Signed)
ANTICOAGULATION CONSULT NOTE - Follow Up Consult  Pharmacy Consult for heparin Indication:  elevated troponin in setting of fall/rhabdo  Labs: Recent Labs    02/24/21 1107 02/24/21 1307 02/24/21 2330  HGB 14.8  --   --   HCT 44.8  --   --   PLT 432*  --   --   HEPARINUNFRC  --   --  0.72*  CREATININE 1.02*  --   --   CKTOTAL 3,898*  --   --   TROPONINIHS 2,358* 2,618*  --     Assessment: 87yo female slightly supratherapeutic on heparin with initial dosing for elevated troponin; no infusion issues or signs of bleeding per RN.  Goal of Therapy:  Heparin level 0.3-0.7 units/ml   Plan:  Will decrease heparin infusion by 1-2 units/kg/hr to 750 units/hr and check level in 8 hours.    Wynona Neat, PharmD, BCPS  02/25/2021,12:25 AM
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
## 2021-02-25 NOTE — Progress Notes (Signed)
PROGRESS NOTE    Cathy Silva  STM:[id number] DOB: Mar 10, 1930 DOA: 02/24/2021 PCP: [name], MD    Brief Narrative:  This 87 years old female with PMH significant for hyperlipidemia, hypertension and mitral valve prolapse,  history of basal cell carcinoma presented s/p mechanical fall.  Patient was unable to get up and remained on the floor for unknown duration.  Patient is admitted for rhabdomyolysis secondary to prolonged contact on the floor.  She is also found to have elevated troponin could be secondary to rhabdomyolysis.  Patient is started on heparin gtt. cardiology consulted echocardiogram LVEF 65 to 60%.  Assessment & Plan:   Principal Problem:   Rhabdomyolysis Active Problems:   Essential hypertension   Back pain   Nonrheumatic aortic valve stenosis   Hyperlipidemia  Rhabdomyolysis s/p mechanical fall. Patient presented s/p fall, unable to get up,  remain on the floor for unknown duration. Patient denies any LOC, prodromal symptoms before fall, head trauma CK 3898 > 1718, Continue to trend CK. Patient received 2 L of LR , continue IV gentle hydration. Serum creatinine normal, continue to monitor. Skeletal work-up unremarkable.   Elevated troponin could be secondary to fall. NSTEMI: Patient presented s/p fall with elevated troponins. Continue heparin gtt, continue to trend troponins. Trop 8921 >1941 >1597 Patient denies any chest pain, EKG shows RBBB,  No ST-T wave changes. Cardiology consulted,  recommended to continue aspirin and heparin.   Elevated liver enzymes: This could be secondary to fall LFTs trending down.   Leukocytosis: Could be reactive. There is no signs of any infection. Continue to trend.   Heart murmur: 2d echo: LVEF 60 to 65%.  No regional wall motion abnormalities. No evidence of mitral stenosis or regurgitation.   Left shoulder  pain: X-ray concerning for nondisplaced fracture Continue pain control.  UTI: UA consistent  with UTI patient reports increased urinary frequency. Start ceftriaxone, follow-up urine cultures.   DVT prophylaxis: Heparin gtt Code Status: Full code. Family Communication: No family at bedside Disposition Plan:   Status is: Inpatient  Remains inpatient appropriate because: Rhabdomyolysis and elevated troponins  Anticipated discharge to SNF in 1 to 2 days.  Consultants:  Cardiology  Procedures: Echo  Antimicrobials: Anti-infectives (From admission, onward)    Start     Dose/Rate Route Frequency Ordered Stop   02/25/21 0615  cefTRIAXone (ROCEPHIN) 1 g in sodium chloride 0.9 % 100 mL IVPB        1 g 200 mL/hr over 30 Minutes Intravenous Daily 02/25/21 0603        Subjective: Patient was seen and examined at bedside.  Overnight events noted. Patient reports feeling better.  She reports slept well last night.  Pain is controlled.  Objective: Vitals:   02/25/21 0800 02/25/21 0815 02/25/21 0911 02/25/21 1212  BP: (!) 165/70 (!) 153/78 136/65 (!) 144/66  Pulse: 83 92 92 97  Resp: (!) 21 16 16 16   Temp:   98.6 F (37 C) 97.6 F (36.4 C)  TempSrc:   Oral Oral  SpO2: 94% 97% 96% 100%  Weight:      Height:        Intake/Output Summary (Last 24 hours) at 02/25/2021 1526 Last data filed at 02/25/2021 0702 Gross per 24 hour  Intake 100.2 ml  Output 750 ml  Net -649.8 ml   Filed Weights   02/24/21 2141  Weight: 63.5 kg    Examination:  General exam: Appears comfortable, not in any acute distress.  Deconditioned Respiratory system: Clear  to auscultation. Respiratory effort normal. Cardiovascular system: S1-S2 heard, regular rate and rhythm, murmur+ Gastrointestinal system: Abdomen is soft, nontender, nondistended, BS+ Central nervous system: Alert and oriented. No focal neurological deficits. Extremities: Left shoulder bruising noted, tenderness+, restricted movements+ Skin: No rashes, lesions or ulcers Psychiatry: Judgement and insight appear normal. Mood &  affect appropriate.     Data Reviewed: I have personally reviewed following labs and imaging studies  CBC: Recent Labs  Lab 02/24/21 1107 02/25/21 0300  WBC 21.3* 18.1*  NEUTROABS 19.0*  --   HGB 14.8 12.1  HCT 44.8 36.7  MCV 83.7 83.6  PLT 432* 967   Basic Metabolic Panel: Recent Labs  Lab 02/24/21 1107 02/25/21 0300  NA 137 134*  K 4.0 3.7  CL 103 103  CO2 22 20*  GLUCOSE 130* 98  BUN 24* 26*  CREATININE 1.02* 0.79  CALCIUM 9.3 8.5*  MG  --  2.1  PHOS  --  3.2   GFR: Estimated Creatinine Clearance: 44.6 mL/min (by C-G formula based on SCr of 0.79 mg/dL). Liver Function Tests: Recent Labs  Lab 02/24/21 1107 02/25/21 0300  AST 164* 117*  ALT 61* 53*  ALKPHOS 54 42  BILITOT 1.4* 1.2  PROT 6.4* 5.0*  ALBUMIN 3.3* 2.5*   No results for input(s): LIPASE, AMYLASE in the last 168 hours. No results for input(s): AMMONIA in the last 168 hours. Coagulation Profile: No results for input(s): INR, PROTIME in the last 168 hours. Cardiac Enzymes: Recent Labs  Lab 02/24/21 1107 02/25/21 0300  CKTOTAL 3,898* 1,718*   BNP (last 3 results) No results for input(s): PROBNP in the last 8760 hours. HbA1C: No results for input(s): HGBA1C in the last 72 hours. CBG: No results for input(s): GLUCAP in the last 168 hours. Lipid Profile: No results for input(s): CHOL, HDL, LDLCALC, TRIG, CHOLHDL, LDLDIRECT in the last 72 hours. Thyroid Function Tests: No results for input(s): TSH, T4TOTAL, FREET4, T3FREE, THYROIDAB in the last 72 hours. Anemia Panel: No results for input(s): VITAMINB12, FOLATE, FERRITIN, TIBC, IRON, RETICCTPCT in the last 72 hours. Sepsis Labs: No results for input(s): PROCALCITON, LATICACIDVEN in the last 168 hours.  Recent Results (from the past 240 hour(s))  Resp Panel by RT-PCR (Flu A&B, Covid) Nasopharyngeal Swab     Status: None   Collection Time: 02/24/21  9:56 PM   Specimen: Nasopharyngeal Swab; Nasopharyngeal(NP) swabs in vial transport  medium  Result Value Ref Range Status   SARS Coronavirus 2 by RT PCR NEGATIVE NEGATIVE Final    Comment: (NOTE) SARS-CoV-2 target nucleic acids are NOT DETECTED.  The SARS-CoV-2 RNA is generally detectable in upper respiratory specimens during the acute phase of infection. The lowest concentration of SARS-CoV-2 viral copies this assay can detect is 138 copies/mL. A negative result does not preclude SARS-Cov-2 infection and should not be used as the sole basis for treatment or other patient management decisions. A negative result may occur with  improper specimen collection/handling, submission of specimen other than nasopharyngeal swab, presence of viral mutation(s) within the areas targeted by this assay, and inadequate number of viral copies(<138 copies/mL). A negative result must be combined with clinical observations, patient history, and epidemiological information. The expected result is Negative.  Fact Sheet for Patients:  EntrepreneurPulse.com.au  Fact Sheet for Healthcare Providers:  IncredibleEmployment.be  This test is no t yet approved or cleared by the [place] FDA and  has been authorized for detection and/or diagnosis of SARS-CoV-2 by FDA under an Emergency Use Authorization (EUA).  This EUA will remain  in effect (meaning this test can be used) for the duration of the COVID-19 declaration under Section 564(b)(1) of the Act, 21 U.S.C.section 360bbb-3(b)(1), unless the authorization is terminated  or revoked sooner.       Influenza A by PCR NEGATIVE NEGATIVE Final   Influenza B by PCR NEGATIVE NEGATIVE Final    Comment: (NOTE) The Xpert Xpress SARS-CoV-2/FLU/RSV plus assay is intended as an aid in the diagnosis of influenza from Nasopharyngeal swab specimens and should not be used as a sole basis for treatment. Nasal washings and aspirates are unacceptable for Xpert Xpress SARS-CoV-2/FLU/RSV testing.  Fact Sheet for  Patients: EntrepreneurPulse.com.au  Fact Sheet for Healthcare Providers: IncredibleEmployment.be  This test is not yet approved or cleared by the [place] FDA and has been authorized for detection and/or diagnosis of SARS-CoV-2 by FDA under an Emergency Use Authorization (EUA). This EUA will remain in effect (meaning this test can be used) for the duration of the COVID-19 declaration under Section 564(b)(1) of the Act, 21 U.S.C. section 360bbb-3(b)(1), unless the authorization is terminated or revoked.  Performed at [facility], [place] [address] [place], [place] [address]     Radiology Studies: DG Elbow Complete Left  Result Date: 02/24/2021 CLINICAL DATA:  Status post fall, elbow pain EXAM: LEFT ELBOW - COMPLETE 3+ VIEW COMPARISON:  None. FINDINGS: Subtle lucency involving the superior corner of the olecranon most concerning for a nondisplaced fracture. No other fracture or dislocation. No aggressive osseous lesion. Normal alignment. Soft tissue are unremarkable. No radiopaque foreign body or soft tissue emphysema. IMPRESSION: 1. Subtle lucency involving the superior corner of the olecranon most concerning for a nondisplaced fracture. Electronically Signed   By: Kathreen Devoid M.D.   On: 02/24/2021 12:44   CT Head Wo Contrast  Result Date: 02/24/2021 CLINICAL DATA:  87 year old female with acute fall and head injury. Initial encounter. EXAM: CT HEAD WITHOUT CONTRAST TECHNIQUE: Contiguous axial images were obtained from the base of the skull through the vertex without intravenous contrast. COMPARISON:  None. FINDINGS: Brain: Obscuration of a portion of the RIGHT insular cortex is noted (series 3: Image 15-16) compatible with an infarct of uncertain chronicity, but appears more remote. No evidence of hemorrhage, hydrocephalus, extra-axial collection or mass lesion/mass effect. Atrophy identified. Mild periventricular white matter hypodensities  likely represent chronic small vessel white matter ischemic changes. Vascular: Carotid and vertebral atherosclerotic calcifications are noted. Skull: No acute abnormality. Sinuses/Orbits: No acute abnormality Other: None IMPRESSION: 1. 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 Correlate clinically. No evidence of hemorrhage. 2. Atrophy and probable chronic small vessel white matter ischemic changes. Electronically Signed   By: Margarette Canada M.D.   On: 02/24/2021 12:01   DG Shoulder Left  Result Date: 02/24/2021 CLINICAL DATA:  Status post fall, shoulder pain EXAM: LEFT SHOULDER - 2+ VIEW COMPARISON:  None. FINDINGS: No acute fracture or dislocation. No aggressive osseous lesion. Normal alignment. Generalized osteopenia. Mild osteoarthritis of the glenohumeral joint and acromioclavicular joint. Soft tissue are unremarkable. No radiopaque foreign body or soft tissue emphysema. IMPRESSION: No acute osseous injury of the left shoulder. Electronically Signed   By: Kathreen Devoid M.D.   On: 02/24/2021 12:45   ECHOCARDIOGRAM COMPLETE  Result Date: 02/25/2021    ECHOCARDIOGRAM REPORT   Patient Name:   Cathy Silva Date of Exam: 02/25/2021 Medical Rec #:  [id number]          Height:  66.0 in Accession #:    [id number]         Weight:       140.0 lb Date of Birth:  May 08, 1929          BSA:          1.719 m Patient Age:    40 years           BP:           173/67 mmHg Patient Gender: F                  HR:           80 bpm. Exam Location:  Inpatient Procedure: 2D Echo, Color Doppler and Cardiac Doppler Indications:    R07.9* Chest pain, unspecified  History:        Patient has prior history of Echocardiogram examinations, most                 recent 12/25/2020. Aortic Valve Disease; Risk                 Factors:Hypertension and Dyslipidemia.  Sonographer:    Glo Herring Referring Phys: [place]  1. Left ventricular ejection fraction, by estimation, is  60 to 65%. The left ventricle has normal function. The left ventricle has no regional wall motion abnormalities. There is severe concentric left ventricular hypertrophy. Indeterminate diastolic filling due to E-A fusion.  2. Right ventricular systolic function is normal. The right ventricular size is normal. Tricuspid regurgitation signal is inadequate for assessing PA pressure.  3. The mitral valve is degenerative. No evidence of mitral valve regurgitation. No evidence of mitral stenosis.  4. The aortic valve is calcified. There is severe calcifcation of the aortic valve. There is severe thickening of the aortic valve. Aortic valve regurgitation is not visualized. Moderate aortic valve stenosis. Aortic valve mean gradient measures 21.0 mmHg. Aortic valve Vmax measures 3.18 m/s.  5. The inferior vena cava is normal in size with <50% respiratory variability, suggesting right atrial pressure of 8 mmHg. FINDINGS  Left Ventricle: Left ventricular ejection fraction, by estimation, is 60 to 65%. The left ventricle has normal function. The left ventricle has no regional wall motion abnormalities. The left ventricular internal cavity size was normal in size. There is  severe concentric left ventricular hypertrophy. Indeterminate diastolic filling due to E-A fusion. Right Ventricle: The right ventricular size is normal. No increase in right ventricular wall thickness. Right ventricular systolic function is normal. Tricuspid regurgitation signal is inadequate for assessing PA pressure. Left Atrium: Left atrial size was normal in size. Right Atrium: Right atrial size was normal in size. Pericardium: There is no evidence of pericardial effusion. Mitral Valve: The mitral valve is degenerative in appearance. There is mild thickening of the mitral valve leaflet(s). There is moderate calcification of the anterior mitral valve leaflet(s). Mild to moderate mitral annular calcification. No evidence of mitral valve regurgitation. No  evidence of mitral valve stenosis. Tricuspid Valve: The tricuspid valve is normal in structure. Tricuspid valve regurgitation is trivial. No evidence of tricuspid stenosis. Aortic Valve: The aortic valve is calcified. There is severe calcifcation of the aortic valve. There is severe thickening of the aortic valve. Aortic valve regurgitation is not visualized. Moderate aortic stenosis is present. Aortic valve mean gradient measures 21.0 mmHg. Aortic valve peak gradient measures 40.4 mmHg. Pulmonic Valve: The pulmonic valve was normal in structure. Pulmonic valve regurgitation is trivial. No evidence of pulmonic stenosis. Aorta: The  aortic root is normal in size and structure. Venous: The inferior vena cava is normal in size with less than 50% respiratory variability, suggesting right atrial pressure of 8 mmHg. IAS/Shunts: No atrial level shunt detected by color flow Doppler.  LEFT VENTRICLE PLAX 2D LVIDd:         2.90 cm LVIDs:         2.20 cm LV PW:         1.50 cm LV IVS:        1.50 cm  IVC IVC diam: 1.30 cm LEFT ATRIUM             Index LA diam:        3.20 cm 1.86 cm/m LA Vol (A2C):   35.3 ml 20.54 ml/m LA Vol (A4C):   40.0 ml 23.28 ml/m LA Biplane Vol: 38.6 ml 22.46 ml/m  AORTIC VALVE AV Vmax:           318.00 cm/s AV Vmean:          218.000 cm/s AV VTI:            0.538 m AV Peak Grad:      40.4 mmHg AV Mean Grad:      21.0 mmHg LVOT Vmax:         80.68 cm/s LVOT Vmean:        56.260 cm/s LVOT VTI:          0.158 m LVOT/AV VTI ratio: 0.29  AORTA Ao Root diam: 3.40 cm Ao Asc diam:  2.70 cm  SHUNTS Systemic VTI: 0.16 m Fransico Him MD Electronically signed by Fransico Him MD Signature Date/Time: 02/25/2021/1:57:48 PM    Final    DG Hip Unilat With Pelvis 2-3 Views Left  Result Date: 02/24/2021 CLINICAL DATA:  Status post fall, bilateral hip pain EXAM: DG HIP (WITH OR WITHOUT PELVIS) 2-3V RIGHT; LEFT FEMUR 2 VIEWS; RIGHT FEMUR 2 VIEWS; DG HIP (WITH OR WITHOUT PELVIS) 2-3V LEFT COMPARISON:  None.  FINDINGS: No acute fracture or dislocation. No aggressive osseous lesion. Normal alignment. Generalized osteopenia. Mild osteoarthritis of the hips bilaterally. Moderate left medial femorotibial compartment osteoarthritis. Mild osteoarthritis of the right medial and lateral femorotibial compartments as well as the left lateral femorotibial compartment. Soft tissue are unremarkable. No radiopaque foreign body or soft tissue emphysema. Peripheral vascular atherosclerotic disease. IMPRESSION: 1.  No acute osseous injury of the right hip. 2.  No acute osseous injury of the left hip. 3.  No acute osseous injury of the right femur. 4.  No acute osseous injury of the left femur. 5. Given the patient's age and osteopenia, if there is persistent clinical concern for an occult hip fracture, a MRI of the hip is recommended for increased sensitivity. Electronically Signed   By: Kathreen Devoid M.D.   On: 02/24/2021 12:52   DG Hip Unilat With Pelvis 2-3 Views Right  Result Date: 02/24/2021 CLINICAL DATA:  Status post fall, bilateral hip pain EXAM: DG HIP (WITH OR WITHOUT PELVIS) 2-3V RIGHT; LEFT FEMUR 2 VIEWS; RIGHT FEMUR 2 VIEWS; DG HIP (WITH OR WITHOUT PELVIS) 2-3V LEFT COMPARISON:  None. FINDINGS: No acute fracture or dislocation. No aggressive osseous lesion. Normal alignment. Generalized osteopenia. Mild osteoarthritis of the hips bilaterally. Moderate left medial femorotibial compartment osteoarthritis. Mild osteoarthritis of the right medial and lateral femorotibial compartments as well as the left lateral femorotibial compartment. Soft tissue are unremarkable. No radiopaque foreign body or soft tissue emphysema. Peripheral vascular atherosclerotic disease. IMPRESSION: 1.  No acute osseous injury of the right hip. 2.  No acute osseous injury of the left hip. 3.  No acute osseous injury of the right femur. 4.  No acute osseous injury of the left femur. 5. Given the patient's age and osteopenia, if there is persistent  clinical concern for an occult hip fracture, a MRI of the hip is recommended for increased sensitivity. Electronically Signed   By: Kathreen Devoid M.D.   On: 02/24/2021 12:52   DG FEMUR MIN 2 VIEWS LEFT  Result Date: 02/24/2021 CLINICAL DATA:  Status post fall, bilateral hip pain EXAM: DG HIP (WITH OR WITHOUT PELVIS) 2-3V RIGHT; LEFT FEMUR 2 VIEWS; RIGHT FEMUR 2 VIEWS; DG HIP (WITH OR WITHOUT PELVIS) 2-3V LEFT COMPARISON:  None. FINDINGS: No acute fracture or dislocation. No aggressive osseous lesion. Normal alignment. Generalized osteopenia. Mild osteoarthritis of the hips bilaterally. Moderate left medial femorotibial compartment osteoarthritis. Mild osteoarthritis of the right medial and lateral femorotibial compartments as well as the left lateral femorotibial compartment. Soft tissue are unremarkable. No radiopaque foreign body or soft tissue emphysema. Peripheral vascular atherosclerotic disease. IMPRESSION: 1.  No acute osseous injury of the right hip. 2.  No acute osseous injury of the left hip. 3.  No acute osseous injury of the right femur. 4.  No acute osseous injury of the left femur. 5. Given the patient's age and osteopenia, if there is persistent clinical concern for an occult hip fracture, a MRI of the hip is recommended for increased sensitivity. Electronically Signed   By: Kathreen Devoid M.D.   On: 02/24/2021 12:52   DG FEMUR, MIN 2 VIEWS RIGHT  Result Date: 02/24/2021 CLINICAL DATA:  Status post fall, bilateral hip pain EXAM: DG HIP (WITH OR WITHOUT PELVIS) 2-3V RIGHT; LEFT FEMUR 2 VIEWS; RIGHT FEMUR 2 VIEWS; DG HIP (WITH OR WITHOUT PELVIS) 2-3V LEFT COMPARISON:  None. FINDINGS: No acute fracture or dislocation. No aggressive osseous lesion. Normal alignment. Generalized osteopenia. Mild osteoarthritis of the hips bilaterally. Moderate left medial femorotibial compartment osteoarthritis. Mild osteoarthritis of the right medial and lateral femorotibial compartments as well as the left lateral  femorotibial compartment. Soft tissue are unremarkable. No radiopaque foreign body or soft tissue emphysema. Peripheral vascular atherosclerotic disease. IMPRESSION: 1.  No acute osseous injury of the right hip. 2.  No acute osseous injury of the left hip. 3.  No acute osseous injury of the right femur. 4.  No acute osseous injury of the left femur. 5. Given the patient's age and osteopenia, if there is persistent clinical concern for an occult hip fracture, a MRI of the hip is recommended for increased sensitivity. Electronically Signed   By: Kathreen Devoid M.D.   On: 02/24/2021 12:52     Scheduled Meds:  amLODipine  5 mg Oral Daily   vitamin C  1,000 mg Oral Daily   aspirin  325 mg Oral Daily   calcium carbonate  1 tablet Oral Daily   docusate sodium  100 mg Oral BID   senna  1 tablet Oral BID   Continuous Infusions:  sodium chloride 75 mL/hr at 02/25/21 0608   cefTRIAXone (ROCEPHIN)  IV Stopped (02/25/21 0702)   heparin 750 Units/hr (02/25/21 0025)     LOS: 1 day    Time spent: 25 mins    Antavia Tandy, MD [facility]   If 7PM-7AM, please contact night-coverage

## 2021-02-25 NOTE — Progress Notes (Signed)
ANTICOAGULATION CONSULT NOTE - Follow Up   Pharmacy Consult for heparin Indication: chest pain/ACS  Patient Measurements: Height: 5\' 6"  (167.6 cm) Weight: 63.5 kg (140 lb) IBW/kg (Calculated) : 59.3 Heparin Dosing Weight: 65 kg   Vital Signs: Temp: 98.6 F (37 C) (10/30 0911) Temp Source: Oral (10/30 0911) BP: 136/65 (10/30 0911) Pulse Rate: 92 (10/30 0911)  Labs: Recent Labs    02/24/21 1107 02/24/21 1307 02/24/21 2330 02/25/21 0300 02/25/21 0958  HGB 14.8  --   --  12.1  --   HCT 44.8  --   --  36.7  --   PLT 432*  --   --  346  --   HEPARINUNFRC  --   --  0.72*  --  0.33  CREATININE 1.02*  --   --  0.79  --   CKTOTAL 9,407*  --   --  1,718*  --   TROPONINIHS 2,358* 2,618*  --   --   --      Estimated Creatinine Clearance: 44.6 mL/min (by C-G formula based on SCr of 0.79 mg/dL).   Medical History: Past Medical History:  Diagnosis Date   Allergies    BCC (basal cell carcinoma), arm 2008   Left    Elevated cholesterol    Estrogen deficiency    Fracture    in back   Hx of scarlet fever    Hypercholesteremia    Hyponatremia    Insomnia    Low sodium levels    Melanoma in situ ([place]) 2009   Mitral valve prolapse    Murmur    Psychosocial problem    Thrombocytosis     Medications:   Assessment: 73 YOF who presents with pain in L shoulder found to have elevate troponin. Pharmacy consulted to start IV heparin. H/H wnl, Plt elevated. Scr 0.79 and Hgb stable at 12.1. Repeat HL this AM is 0.33 which is therapeutic.   Goal of Therapy:  Heparin level 0.3-0.7 units/ml Monitor platelets by anticoagulation protocol: Yes   Plan:  Continue heparin 750 units/hr  Daily HL and CBC  Monitor for s/sx of bleed    Cephus Slater, PharmD, [facility] Pharmacy Resident [phone] 02/25/2021 11:13 AM

## 2021-02-26 DIAGNOSIS — I350 Nonrheumatic aortic (valve) stenosis: Secondary | ICD-10-CM

## 2021-02-26 DIAGNOSIS — T796XXA Traumatic ischemia of muscle, initial encounter: Secondary | ICD-10-CM | POA: Diagnosis not present

## 2021-02-26 DIAGNOSIS — I10 Essential (primary) hypertension: Secondary | ICD-10-CM

## 2021-02-26 DIAGNOSIS — R778 Other specified abnormalities of plasma proteins: Secondary | ICD-10-CM | POA: Diagnosis not present

## 2021-02-26 LAB — HEPARIN LEVEL (UNFRACTIONATED)
Heparin Unfractionated: 0.15 IU/mL — ABNORMAL LOW (ref 0.30–0.70)
Heparin Unfractionated: 0.32 IU/mL (ref 0.30–0.70)

## 2021-02-26 LAB — BASIC METABOLIC PANEL
Anion gap: 6 (ref 5–15)
BUN: 23 mg/dL (ref 8–23)
CO2: 22 mmol/L (ref 22–32)
Calcium: 8 mg/dL — ABNORMAL LOW (ref 8.9–10.3)
Chloride: 107 mmol/L (ref 98–111)
Creatinine, Ser: 0.62 mg/dL (ref 0.44–1.00)
GFR, Estimated: >60 mL/min (ref >60)
Glucose, Bld: 106 mg/dL — ABNORMAL HIGH (ref 70–99)
Potassium: 3.7 mmol/L (ref 3.5–5.1)
Sodium: 135 mmol/L (ref 135–145)

## 2021-02-26 LAB — CBC
HCT: 32 % — ABNORMAL LOW (ref 36.0–46.0)
Hemoglobin: 10.6 g/dL — ABNORMAL LOW (ref 12.0–15.0)
MCH: 27.9 pg (ref 26.0–34.0)
MCHC: 33.1 g/dL (ref 30.0–36.0)
MCV: 84.2 fL (ref 80.0–100.0)
Platelets: 309 10*3/uL (ref 150–400)
RBC: 3.8 MIL/uL — ABNORMAL LOW (ref 3.87–5.11)
RDW: 14.1 % (ref 11.5–15.5)
WBC: 13 10*3/uL — ABNORMAL HIGH (ref 4.0–10.5)
nRBC: 0 % (ref 0.0–0.2)

## 2021-02-26 LAB — CK: Total CK: 943 U/L — ABNORMAL HIGH (ref 38–234)

## 2021-02-26 MED ORDER — ASPIRIN EC 81 MG PO TBEC
81.0000 mg | DELAYED_RELEASE_TABLET | Freq: Every day | ORAL | Status: DC
  Administered 2021-02-27 – 2021-02-28 (×2): 81 mg via ORAL
  Filled 2021-02-26 (×2): qty 1

## 2021-02-26 MED ORDER — CARVEDILOL 6.25 MG PO TABS
6.2500 mg | ORAL_TABLET | Freq: Two times a day (BID) | ORAL | Status: DC
  Administered 2021-02-26 – 2021-02-28 (×5): 6.25 mg via ORAL
  Filled 2021-02-26 (×5): qty 1

## 2021-02-26 NOTE — Consult Note (Signed)
Cardiology Consultation:   Patient ID: Cathy Silva MRN: [id number]; DOB: Oct 19, 1929  Admit date: 02/24/2021 Date of Consult: 02/26/2021  PCP:  [name], MD   [place] Providers Cardiologist:  Werner Lean, MD   {   Patient Profile:   Cathy Silva is a 87 y.o. female with a hx of aortic stenosis, hypertension, hyperlipidemia, aortic atherosclerosis and possible mitral valve prolapse who is being seen 02/26/2021 for the evaluation of Elevated troponin  at the request of Dr.Kumar.  Establish care with Dr. Gasper Sells February 2022 for finding of moderate to severe aortic stenosis on echocardiogram.  EKG with right bundle branch block.  No history of syncope or dyspnea on exertion.  Patient was hesitant about invasive procedure.  Plan was to repeat echo in 6 months.  History of Present Illness:   Ms. Feeser found on floor by neighbors and brought here for further evaluation.  Today patient reports that on Thursday 10/27 went out for shopping.  She was putting everything together and next thing remember waking up on floor but unable to get up.  Patient lives by herself.  Does household chores and still drives.  Due to no activity, neighbor call police on [address] and found her on the floor and brought to hospital for further evaluation.  Was admitted for further evaluation.  Treated with IV heparin for elevated troponin and antibiotic for UTI.  Left shoulder x-ray concerning for nondisplaced fracture.  CK and troponin has been improved.  Patient denies chest pain, shortness of breath, orthopnea, PND, lower extremity edema or melena.  CK total 3898>>1718>>943 Hs-troponin 2358>>2618>>1597  Echo 02/25/21 1. Left ventricular ejection fraction, by estimation, is 60 to 65%. The  left ventricle has normal function. The left ventricle has no regional  wall motion abnormalities. There is severe concentric left ventricular  hypertrophy. Indeterminate diastolic   filling due to E-A fusion.   2. Right ventricular systolic function is normal. The right ventricular  size is normal. Tricuspid regurgitation signal is inadequate for assessing  PA pressure.   3. The mitral valve is degenerative. No evidence of mitral valve  regurgitation. No evidence of mitral stenosis.   4. The aortic valve is calcified. There is severe calcifcation of the  aortic valve. There is severe thickening of the aortic valve. Aortic valve  regurgitation is not visualized. Moderate aortic valve stenosis. Aortic  valve mean gradient measures 21.0  mmHg. Aortic valve Vmax measures 3.18 m/s.   5. The inferior vena cava is normal in size with <50%  Echo 12/25/20 1. Left ventricular ejection fraction, by estimation, is 55 to 60%. The  left ventricle has normal function. The left ventricle has no regional  wall motion abnormalities. There is moderate asymmetric left ventricular  hypertrophy of the basal-septal  segment. Left ventricular diastolic parameters are consistent with Grade I  diastolic dysfunction (impaired relaxation). Elevated left ventricular  end-diastolic pressure.   2. Right ventricular systolic function is normal. The right ventricular  size is normal. There is normal pulmonary artery systolic pressure.   3. The mitral valve is degenerative. Trivial mitral valve regurgitation.  No evidence of mitral stenosis. Moderate mitral annular calcification.   4. The aortic valve is tricuspid. There is severe calcifcation of the  aortic valve. There is severe thickening of the aortic valve. Aortic valve  regurgitation is not visualized. Moderate aortic valve stenosis. Aortic  valve area, by VTI measures 0.62  cm. Aortic valve mean gradient measures 28.5 mmHg. Aortic valve Vmax  measures 3.41 m/s.   5. The inferior vena cava is normal in size with greater than 50%  respiratory variability, suggesting right atrial pressure of 3 mmHg.     Past Medical History:  Diagnosis  Date   Allergies    BCC (basal cell carcinoma), arm 2008   Left    Elevated cholesterol    Estrogen deficiency    Fracture    in back   Hx of scarlet fever    Hypercholesteremia    Hyponatremia    Insomnia    Low sodium levels    Melanoma in situ ([place]) 2009   Mitral valve prolapse    Murmur    Psychosocial problem    Thrombocytosis     Past Surgical History:  Procedure Laterality Date   BALLOON DILATION N/A 05/11/2015   Procedure: BALLOON DILATION;  Surgeon: Carol Ada, MD;  Location[facility] ENDOSCOPY;  Service: Endoscopy;  Laterality: N/A;   DILATION AND CURETTAGE OF UTERUS  05/1984   Prolif   DILATION AND CURETTAGE OF UTERUS  06/1986   benign   ESOPHAGOGASTRODUODENOSCOPY Left 05/11/2015   Procedure: ESOPHAGOGASTRODUODENOSCOPY (EGD);  Surgeon: Carol Ada, MD;  Location: [facility] ENDOSCOPY;  Service: Endoscopy;  Laterality: Left;   EYE SURGERY     bilat cataract   TONSILLECTOMY     TOTAL ABDOMINAL HYSTERECTOMY W/ BILATERAL SALPINGOOPHORECTOMY  05/1987   Leiomyoma     Inpatient Medications: Scheduled Meds:  amLODipine  5 mg Oral Daily   vitamin C  1,000 mg Oral Daily   aspirin  325 mg Oral Daily   calcium carbonate  1 tablet Oral Daily   carvedilol  6.25 mg Oral BID WC   docusate sodium  100 mg Oral BID   senna  1 tablet Oral BID   Continuous Infusions:  sodium chloride 75 mL/hr at 02/25/21 2023   cefTRIAXone (ROCEPHIN)  IV 1 g (02/26/21 0624)   heparin 900 Units/hr (02/26/21 0314)   PRN Meds: acetaminophen **OR** acetaminophen, ondansetron **OR** ondansetron (ZOFRAN) IV  Allergies:    Allergies  Allergen Reactions   Novocain [Procaine] Shortness Of Breath and Other (See Comments)    "Deathly allergic to this"   Codeine Other (See Comments)    Reaction not recalled by the patient- stated it was "a long time ago"   Pneumococcal Vaccines Other (See Comments)    Reaction not recalled by the patient- stated it was "a long time ago"   Pseudoephedrine Other (See  Comments)    Reaction not recalled by the patient- stated it was "a long time ago"   Tetanus Toxoids Swelling and Other (See Comments)    Swelling at injecton site   Tetanus-Diphtheria Toxoids Td Swelling and Other (See Comments)    Swelling where injected    Social History:   Social History   Socioeconomic History   Marital status: Widowed    Spouse name: Not on file   Number of children: Not on file   Years of education: Not on file   Highest education level: Not on file  Occupational History   Not on file  Tobacco Use   Smoking status: Never   Smokeless tobacco: Never  Substance and Sexual Activity   Alcohol use: No    Alcohol/week: 0.0 standard drinks   Drug use: No   Sexual activity: Not Currently    Birth control/protection: Surgical    Comment: TVH  Other Topics Concern   Not on file  Social History Narrative   Not on file  Social Determinants of Health   Financial Resource Strain: Not on file  Food Insecurity: Not on file  Transportation Needs: Not on file  Physical Activity: Not on file  Stress: Not on file  Social Connections: Not on file  Intimate Partner Violence: Not on file    Family History:    Family History  Problem Relation Age of Onset   Heart attack Maternal Grandmother    CAD Mother    Tuberculosis Father    Hemangiomas Brother      ROS:  Please see the history of present illness.  All other ROS reviewed and negative.     Physical Exam/Data:   Vitals:   02/25/21 2349 02/26/21 0430 02/26/21 0715 02/26/21 0757  BP: (!) 162/62 (!) 185/76 (!) 169/81 (!) 136/47  Pulse: 82 83 85 79  Resp: 20 17 20 16   Temp: 98.1 F (36.7 C) 98 F (36.7 C) 97.8 F (36.6 C) 97.7 F (36.5 C)  TempSrc: Oral Oral Oral Oral  SpO2: 97% 96% 96% 99%  Weight:      Height:        Intake/Output Summary (Last 24 hours) at 02/26/2021 1056 Last data filed at 02/26/2021 0647 Gross per 24 hour  Intake 3039.36 ml  Output 800 ml  Net 2239.36 ml   Last 3  Weights 02/24/2021 06/26/2020 07/31/2017  Weight (lbs) 140 lb 144 lb 149 lb  Weight (kg) 63.504 kg 65.318 kg 67.586 kg     Body mass index is 22.6 kg/m.  General:  Well nourished, well developed, in no acute distress HEENT: normal Neck: no JVD Vascular: No carotid bruits; Distal pulses 2+ bilaterally Cardiac:  normal S1, S2; RRR; 3/6 systolic murmur  Lungs:  clear to auscultation bilaterally, no wheezing, rhonchi or rales  Abd: soft, nontender, no hepatomegaly  Ext: no edema Musculoskeletal:  No deformities, BUE and BLE strength normal and equal Skin: warm and dry  Neuro:  CNs 2-12 intact, no focal abnormalities noted Psych:  Normal affect   EKG:  The EKG was personally reviewed and demonstrates:  Sinus rhythm, RBBB Telemetry:  Telemetry was personally reviewed and demonstrates: Sinus rhythm, PAC, PVC syncope/fall  Relevant CV Studies:  Echo 05/10/20 1. Left ventricular ejection fraction, by estimation, is 60 to 65%. The  left ventricle has normal function. The left ventricle has no regional  wall motion abnormalities. There is moderate hypertrophy of the basal  septum and mild concentric hypertrophy  of the rest of the LV segments. Left ventricular diastolic parameters are  consistent with Grade I diastolic dysfunction (impaired relaxation).  Elevated left atrial pressure.   2. Right ventricular systolic function is normal. The right ventricular  size is normal.   3. The mitral valve is degenerative. Mild mitral valve regurgitation.  Moderate mitral annular calcification. There is mild thickening and  calcification of the mitral valve leaflets, most notably of the anterior  leaflet.   4. The aortic valve is tricuspid. There is severe calcifcation of the  aortic valve. There is severe thickening of the aortic valve. Aortic valve  regurgitation is trivial. Moderate aortic valve stenosis. Aortic valve  mean gradient measures 18.0 mmHg.  Aortic valve peak gradient measures 31.4  mmHg. Aortic valve area, by VTI  measures 0.90 cm. DOI 0.29.   Laboratory Data:  High Sensitivity Troponin:   Recent Labs  Lab 02/24/21 1107 02/24/21 1307 02/25/21 0958  TROPONINIHS 2,358* 2,618* 1,597*     Chemistry Recent Labs  Lab 02/24/21 1107 02/25/21 0300 02/26/21  0121  NA 137 134* 135  K 4.0 3.7 3.7  CL 103 103 107  CO2 22 20* 22  GLUCOSE 130* 98 106*  BUN 24* 26* 23  CREATININE 1.02* 0.79 0.62  CALCIUM 9.3 8.5* 8.0*  MG  --  2.1  --   GFRNONAA 53* >60 >60  ANIONGAP 12 11 6     Recent Labs  Lab 02/24/21 1107 02/25/21 0300  PROT 6.4* 5.0*  ALBUMIN 3.3* 2.5*  AST 164* 117*  ALT 61* 53*  ALKPHOS 54 42  BILITOT 1.4* 1.2   Lipids No results for input(s): CHOL, TRIG, HDL, LABVLDL, LDLCALC, CHOLHDL in the last 168 hours.  Hematology Recent Labs  Lab 02/24/21 1107 02/25/21 0300 02/26/21 0121  WBC 21.3* 18.1* 13.0*  RBC 5.35* 4.39 3.80*  HGB 14.8 12.1 10.6*  HCT 44.8 36.7 32.0*  MCV 83.7 83.6 84.2  MCH 27.7 27.6 27.9  MCHC 33.0 33.0 33.1  RDW 14.0 14.0 14.1  PLT 432* 346 309   Thyroid No results for input(s): TSH, FREET4 in the last 168 hours.  BNPNo results for input(s): BNP, PROBNP in the last 168 hours.  DDimer No results for input(s): DDIMER in the last 168 hours.   Radiology/Studies:  DG Elbow Complete Left  Result Date: 02/24/2021 CLINICAL DATA:  Status post fall, elbow pain EXAM: LEFT ELBOW - COMPLETE 3+ VIEW COMPARISON:  None. FINDINGS: Subtle lucency involving the superior corner of the olecranon most concerning for a nondisplaced fracture. No other fracture or dislocation. No aggressive osseous lesion. Normal alignment. Soft tissue are unremarkable. No radiopaque foreign body or soft tissue emphysema. IMPRESSION: 1. Subtle lucency involving the superior corner of the olecranon most concerning for a nondisplaced fracture. Electronically Signed   By: Kathreen Devoid M.D.   On: 02/24/2021 12:44   CT Head Wo Contrast  Result Date:  02/24/2021 CLINICAL DATA:  87 year old female with acute fall and head injury. Initial encounter. EXAM: CT HEAD WITHOUT CONTRAST TECHNIQUE: Contiguous axial images were obtained from the base of the skull through the vertex without intravenous contrast. COMPARISON:  None. FINDINGS: Brain: Obscuration of a portion of the RIGHT insular cortex is noted (series 3: Image 15-16) compatible with an infarct of uncertain chronicity, but appears more remote. No evidence of hemorrhage, hydrocephalus, extra-axial collection or mass lesion/mass effect. Atrophy identified. Mild periventricular white matter hypodensities likely represent chronic small vessel white matter ischemic changes. Vascular: Carotid and vertebral atherosclerotic calcifications are noted. Skull: No acute abnormality. Sinuses/Orbits: No acute abnormality Other: None IMPRESSION: 1. 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 Correlate clinically. No evidence of hemorrhage. 2. Atrophy and probable chronic small vessel white matter ischemic changes. Electronically Signed   By: Margarette Canada M.D.   On: 02/24/2021 12:01   DG Shoulder Left  Result Date: 02/24/2021 CLINICAL DATA:  Status post fall, shoulder pain EXAM: LEFT SHOULDER - 2+ VIEW COMPARISON:  None. FINDINGS: No acute fracture or dislocation. No aggressive osseous lesion. Normal alignment. Generalized osteopenia. Mild osteoarthritis of the glenohumeral joint and acromioclavicular joint. Soft tissue are unremarkable. No radiopaque foreign body or soft tissue emphysema. IMPRESSION: No acute osseous injury of the left shoulder. Electronically Signed   By: Kathreen Devoid M.D.   On: 02/24/2021 12:45   ECHOCARDIOGRAM COMPLETE  Result Date: 02/25/2021    ECHOCARDIOGRAM REPORT   Patient Name:   VERNEAL WIERS Date of Exam: 02/25/2021 Medical Rec #:  [id number]          Height:  66.0 in Accession #:    [id number]         Weight:       140.0 lb Date of Birth:   Sep 26, 1929          BSA:          1.719 m Patient Age:    44 years           BP:           173/67 mmHg Patient Gender: F                  HR:           80 bpm. Exam Location:  Inpatient Procedure: 2D Echo, Color Doppler and Cardiac Doppler Indications:    R07.9* Chest pain, unspecified  History:        Patient has prior history of Echocardiogram examinations, most                 recent 12/25/2020. Aortic Valve Disease; Risk                 Factors:Hypertension and Dyslipidemia.  Sonographer:    Glo Herring Referring Phys: [place]  1. Left ventricular ejection fraction, by estimation, is 60 to 65%. The left ventricle has normal function. The left ventricle has no regional wall motion abnormalities. There is severe concentric left ventricular hypertrophy. Indeterminate diastolic filling due to E-A fusion.  2. Right ventricular systolic function is normal. The right ventricular size is normal. Tricuspid regurgitation signal is inadequate for assessing PA pressure.  3. The mitral valve is degenerative. No evidence of mitral valve regurgitation. No evidence of mitral stenosis.  4. The aortic valve is calcified. There is severe calcifcation of the aortic valve. There is severe thickening of the aortic valve. Aortic valve regurgitation is not visualized. Moderate aortic valve stenosis. Aortic valve mean gradient measures 21.0 mmHg. Aortic valve Vmax measures 3.18 m/s.  5. The inferior vena cava is normal in size with <50% respiratory variability, suggesting right atrial pressure of 8 mmHg. FINDINGS  Left Ventricle: Left ventricular ejection fraction, by estimation, is 60 to 65%. The left ventricle has normal function. The left ventricle has no regional wall motion abnormalities. The left ventricular internal cavity size was normal in size. There is  severe concentric left ventricular hypertrophy. Indeterminate diastolic filling due to E-A fusion. Right Ventricle: The right ventricular size is  normal. No increase in right ventricular wall thickness. Right ventricular systolic function is normal. Tricuspid regurgitation signal is inadequate for assessing PA pressure. Left Atrium: Left atrial size was normal in size. Right Atrium: Right atrial size was normal in size. Pericardium: There is no evidence of pericardial effusion. Mitral Valve: The mitral valve is degenerative in appearance. There is mild thickening of the mitral valve leaflet(s). There is moderate calcification of the anterior mitral valve leaflet(s). Mild to moderate mitral annular calcification. No evidence of mitral valve regurgitation. No evidence of mitral valve stenosis. Tricuspid Valve: The tricuspid valve is normal in structure. Tricuspid valve regurgitation is trivial. No evidence of tricuspid stenosis. Aortic Valve: The aortic valve is calcified. There is severe calcifcation of the aortic valve. There is severe thickening of the aortic valve. Aortic valve regurgitation is not visualized. Moderate aortic stenosis is present. Aortic valve mean gradient measures 21.0 mmHg. Aortic valve peak gradient measures 40.4 mmHg. Pulmonic Valve: The pulmonic valve was normal in structure. Pulmonic valve regurgitation is trivial. No evidence of pulmonic stenosis. Aorta: The  aortic root is normal in size and structure. Venous: The inferior vena cava is normal in size with less than 50% respiratory variability, suggesting right atrial pressure of 8 mmHg. IAS/Shunts: No atrial level shunt detected by color flow Doppler.  LEFT VENTRICLE PLAX 2D LVIDd:         2.90 cm LVIDs:         2.20 cm LV PW:         1.50 cm LV IVS:        1.50 cm  IVC IVC diam: 1.30 cm LEFT ATRIUM             Index LA diam:        3.20 cm 1.86 cm/m LA Vol (A2C):   35.3 ml 20.54 ml/m LA Vol (A4C):   40.0 ml 23.28 ml/m LA Biplane Vol: 38.6 ml 22.46 ml/m  AORTIC VALVE AV Vmax:           318.00 cm/s AV Vmean:          218.000 cm/s AV VTI:            0.538 m AV Peak Grad:      40.4  mmHg AV Mean Grad:      21.0 mmHg LVOT Vmax:         80.68 cm/s LVOT Vmean:        56.260 cm/s LVOT VTI:          0.158 m LVOT/AV VTI ratio: 0.29  AORTA Ao Root diam: 3.40 cm Ao Asc diam:  2.70 cm  SHUNTS Systemic VTI: 0.16 m Fransico Him MD Electronically signed by Fransico Him MD Signature Date/Time: 02/25/2021/1:57:48 PM    Final    DG Hip Unilat With Pelvis 2-3 Views Left  Result Date: 02/24/2021 CLINICAL DATA:  Status post fall, bilateral hip pain EXAM: DG HIP (WITH OR WITHOUT PELVIS) 2-3V RIGHT; LEFT FEMUR 2 VIEWS; RIGHT FEMUR 2 VIEWS; DG HIP (WITH OR WITHOUT PELVIS) 2-3V LEFT COMPARISON:  None. FINDINGS: No acute fracture or dislocation. No aggressive osseous lesion. Normal alignment. Generalized osteopenia. Mild osteoarthritis of the hips bilaterally. Moderate left medial femorotibial compartment osteoarthritis. Mild osteoarthritis of the right medial and lateral femorotibial compartments as well as the left lateral femorotibial compartment. Soft tissue are unremarkable. No radiopaque foreign body or soft tissue emphysema. Peripheral vascular atherosclerotic disease. IMPRESSION: 1.  No acute osseous injury of the right hip. 2.  No acute osseous injury of the left hip. 3.  No acute osseous injury of the right femur. 4.  No acute osseous injury of the left femur. 5. Given the patient's age and osteopenia, if there is persistent clinical concern for an occult hip fracture, a MRI of the hip is recommended for increased sensitivity. Electronically Signed   By: Kathreen Devoid M.D.   On: 02/24/2021 12:52   DG Hip Unilat With Pelvis 2-3 Views Right  Result Date: 02/24/2021 CLINICAL DATA:  Status post fall, bilateral hip pain EXAM: DG HIP (WITH OR WITHOUT PELVIS) 2-3V RIGHT; LEFT FEMUR 2 VIEWS; RIGHT FEMUR 2 VIEWS; DG HIP (WITH OR WITHOUT PELVIS) 2-3V LEFT COMPARISON:  None. FINDINGS: No acute fracture or dislocation. No aggressive osseous lesion. Normal alignment. Generalized osteopenia. Mild osteoarthritis  of the hips bilaterally. Moderate left medial femorotibial compartment osteoarthritis. Mild osteoarthritis of the right medial and lateral femorotibial compartments as well as the left lateral femorotibial compartment. Soft tissue are unremarkable. No radiopaque foreign body or soft tissue emphysema. Peripheral vascular atherosclerotic disease. IMPRESSION: 1.  No acute osseous injury of the right hip. 2.  No acute osseous injury of the left hip. 3.  No acute osseous injury of the right femur. 4.  No acute osseous injury of the left femur. 5. Given the patient's age and osteopenia, if there is persistent clinical concern for an occult hip fracture, a MRI of the hip is recommended for increased sensitivity. Electronically Signed   By: Kathreen Devoid M.D.   On: 02/24/2021 12:52   DG FEMUR MIN 2 VIEWS LEFT  Result Date: 02/24/2021 CLINICAL DATA:  Status post fall, bilateral hip pain EXAM: DG HIP (WITH OR WITHOUT PELVIS) 2-3V RIGHT; LEFT FEMUR 2 VIEWS; RIGHT FEMUR 2 VIEWS; DG HIP (WITH OR WITHOUT PELVIS) 2-3V LEFT COMPARISON:  None. FINDINGS: No acute fracture or dislocation. No aggressive osseous lesion. Normal alignment. Generalized osteopenia. Mild osteoarthritis of the hips bilaterally. Moderate left medial femorotibial compartment osteoarthritis. Mild osteoarthritis of the right medial and lateral femorotibial compartments as well as the left lateral femorotibial compartment. Soft tissue are unremarkable. No radiopaque foreign body or soft tissue emphysema. Peripheral vascular atherosclerotic disease. IMPRESSION: 1.  No acute osseous injury of the right hip. 2.  No acute osseous injury of the left hip. 3.  No acute osseous injury of the right femur. 4.  No acute osseous injury of the left femur. 5. Given the patient's age and osteopenia, if there is persistent clinical concern for an occult hip fracture, a MRI of the hip is recommended for increased sensitivity. Electronically Signed   By: Kathreen Devoid M.D.   On:  02/24/2021 12:52   DG FEMUR, MIN 2 VIEWS RIGHT  Result Date: 02/24/2021 CLINICAL DATA:  Status post fall, bilateral hip pain EXAM: DG HIP (WITH OR WITHOUT PELVIS) 2-3V RIGHT; LEFT FEMUR 2 VIEWS; RIGHT FEMUR 2 VIEWS; DG HIP (WITH OR WITHOUT PELVIS) 2-3V LEFT COMPARISON:  None. FINDINGS: No acute fracture or dislocation. No aggressive osseous lesion. Normal alignment. Generalized osteopenia. Mild osteoarthritis of the hips bilaterally. Moderate left medial femorotibial compartment osteoarthritis. Mild osteoarthritis of the right medial and lateral femorotibial compartments as well as the left lateral femorotibial compartment. Soft tissue are unremarkable. No radiopaque foreign body or soft tissue emphysema. Peripheral vascular atherosclerotic disease. IMPRESSION: 1.  No acute osseous injury of the right hip. 2.  No acute osseous injury of the left hip. 3.  No acute osseous injury of the right femur. 4.  No acute osseous injury of the left femur. 5. Given the patient's age and osteopenia, if there is persistent clinical concern for an occult hip fracture, a MRI of the hip is recommended for increased sensitivity. Electronically Signed   By: Kathreen Devoid M.D.   On: 02/24/2021 12:52     Assessment and Plan:   Syncope/fall -Unclear event.  She could have syncope given underlying aortic stenosis but is in moderate range. -No arrhythmia on telemetry -Would not recommended to drive for 57-months  2.  Elevated troponin -Hs-troponin 2358>>2618>>1597 -No chest pain or shortness of breath -Likely demand in setting of rhabdomyolysis/being down for unknown period of time -Currently on IV heparin>> DC per MD  3.  Aortic stenosis -In moderate range by echocardiogram this admission. Aortic  valve mean gradient measures 21.0  mmHg. Aortic valve Vmax measures 3.18 m/s.  -Patient unsure about invasive procedure.  Otherwise per primary team.    For questions or updates, please contact [facility] Please  consult [email or web address] for contact info under    Signed, Leanor Kail, PA  02/26/2021 10:56 AM

## 2021-02-26 NOTE — Progress Notes (Signed)
Pt BP was 185/76 and pt has no PRN, pt was also pt was unable to urinate. Bladder scanner showed 784 mL. On call provider notified, order received for cor Coreg 6.25 mg PO twice daily and one time order for in and out cath. 800 mL of urine removed via straight cath.

## 2021-02-26 NOTE — Progress Notes (Signed)
ANTICOAGULATION CONSULT NOTE - Follow Up   Pharmacy Consult for heparin Indication: chest pain/ACS  Patient Measurements: Height: 5\' 6"  (167.6 cm) Weight: 63.5 kg (140 lb) IBW/kg (Calculated) : 59.3 Heparin Dosing Weight: 65 kg   Vital Signs: Temp: 97.7 F (36.5 C) (10/31 0757) Temp Source: Oral (10/31 0757) BP: 136/47 (10/31 0757) Pulse Rate: 79 (10/31 0757)  Labs: Recent Labs    02/24/21 1107 02/24/21 1307 02/24/21 2330 02/25/21 0300 02/25/21 0958 02/26/21 0121 02/26/21 1055  HGB 14.8  --   --  12.1  --  10.6*  --   HCT 44.8  --   --  36.7  --  32.0*  --   PLT 432*  --   --  346  --  309  --   HEPARINUNFRC  --   --    < >  --  0.33 0.15* 0.32  CREATININE 1.02*  --   --  0.79  --  0.62  --   CKTOTAL 8,416*  --   --  1,718*  --  943*  --   TROPONINIHS 2,358* 2,618*  --   --  1,597*  --   --    < > = values in this interval not displayed.     Estimated Creatinine Clearance: 44.6 mL/min (by C-G formula based on SCr of 0.62 mg/dL).  Assessment: 10 YOF who presents with pain in L shoulder found to have elevated troponin. Pharmacy consulted to start IV heparin.   Heparin level therapeutic at 0.32 on gtt at 900 units/hr. No bleeding reported. Hgb down to 10.6, platelets are normal.   Goal of Therapy:  Heparin level 0.3-0.7 units/ml Monitor platelets by anticoagulation protocol: Yes   Plan:  Continue heparin infusion at 900 units/hr  Daily heparin level and CBC Monitor for signs/symptoms of bleeding   Thank you for involving pharmacy in this patient's care.  Renold Genta, PharmD, BCPS Clinical Pharmacist Clinical phone for 02/26/2021 until 3p is [phone] 02/26/2021 1:06 PM  **Pharmacist phone directory can be found on [email or web address] listed under [facility]**

## 2021-02-26 NOTE — Progress Notes (Signed)
PROGRESS NOTE    Cathy Silva  WLN:[id number] DOB: [birth year] DOA: 02/24/2021 PCP: [name], MD    Brief Narrative:  This 87 years old female with PMH significant for hyperlipidemia, hypertension and mitral valve prolapse,  history of basal cell carcinoma presented s/p mechanical fall.  Patient was unable to get up and remained on the floor for unknown duration.  Patient is admitted for rhabdomyolysis secondary to prolonged contact on the floor.  She is also found to have elevated troponin could be secondary to rhabdomyolysis.  Patient is started on heparin gtt. cardiology consulted,  echocardiogram LVEF 65 to 60%.  No plans for any ischemic work-up at this point.  Assessment & Plan:   Principal Problem:   Rhabdomyolysis Active Problems:   Essential hypertension   Back pain   Nonrheumatic aortic valve stenosis   Hyperlipidemia  Rhabdomyolysis s/p mechanical fall. Patient presented s/p fall, unable to get up,  remain on the floor for unknown duration. Patient denies any LOC, prodromal symptoms before fall, head trauma CK 3898 > 1718 > 943 , Continue to trend CK. Patient received 2 L of LR , continue IV gentle hydration. Serum creatinine normal, continue to monitor. Skeletal work-up unremarkable.   Elevated troponin could be secondary to fall. Patient presented s/p fall with elevated troponins. Completed heparin x 48 hrs Trop 2358 >2618 >1597 Patient denies any chest pain, EKG shows RBBB,  No ST-T wave changes. Cardiology consulted, continue aspirin. No plans for any invasive work-up at this time.   Elevated liver enzymes: This could be secondary to fall LFTs trending down.   Leukocytosis: Could be reactive. There is no signs of any infection. Continue to trend.   Heart murmur: 2d echo: LVEF 60 to 65%.  No regional wall motion abnormalities. No evidence of mitral stenosis or regurgitation.   Left shoulder  pain: X-ray concerning for nondisplaced  fracture Continue pain control.    UTI: UA consistent with UTI patient reports increased urinary frequency. Continue ceftriaxone x 5 days.   DVT prophylaxis: Heparin gtt Code Status: Full code. Family Communication: No family at bedside Disposition Plan:   Status is: Inpatient  Remains inpatient appropriate because: Rhabdomyolysis and elevated troponins  Anticipated discharge to SNF in 1 to 2 days.  Consultants:  Cardiology  Procedures: Echo  Antimicrobials: Anti-infectives (From admission, onward)    Start     Dose/Rate Route Frequency Ordered Stop   02/25/21 0615  cefTRIAXone (ROCEPHIN) 1 g in sodium chloride 0.9 % 100 mL IVPB        1 g 200 mL/hr over 30 Minutes Intravenous Daily 02/25/21 0603        Subjective: Patient was seen and examined at bedside.  Overnight events noted. She reports feeling much better.  She slept well last night.  She reports left shoulder pain.  Objective: Vitals:   02/25/21 2349 02/26/21 0430 02/26/21 0715 02/26/21 0757  BP: (!) 162/62 (!) 185/76 (!) 169/81 (!) 136/47  Pulse: 82 83 85 79  Resp: 20 17 20 16   Temp: 98.1 F (36.7 C) 98 F (36.7 C) 97.8 F (36.6 C) 97.7 F (36.5 C)  TempSrc: Oral Oral Oral Oral  SpO2: 97% 96% 96% 99%  Weight:      Height:        Intake/Output Summary (Last 24 hours) at 02/26/2021 1619 Last data filed at 02/26/2021 0647 Gross per 24 hour  Intake 2739.36 ml  Output 800 ml  Net 1939.36 ml   [employer]  02/24/21 2141  Weight: 63.5 kg    Examination:  General exam: Appears comfortable, not in any acute distress.  Deconditioned Respiratory system: Clear to auscultation. Respiratory effort normal. RR 15 Cardiovascular system: S1-S2 heard, regular rate and rhythm, murmur+ Gastrointestinal system: Abdomen is soft, nontender, nondistended, BS+ Central nervous system: Alert and oriented. No focal neurological deficits. Extremities: Left shoulder bruising noted, tenderness+, restricted  movements+ Skin: No rashes, lesions or ulcers Psychiatry: Judgement and insight appear normal. Mood & affect appropriate.     Data Reviewed: I have personally reviewed following labs and imaging studies  CBC: Recent Labs  Lab 02/24/21 1107 02/25/21 0300 02/26/21 0121  WBC 21.3* 18.1* 13.0*  NEUTROABS 19.0*  --   --   HGB 14.8 12.1 10.6*  HCT 44.8 36.7 32.0*  MCV 83.7 83.6 84.2  PLT 432* 346 233   Basic Metabolic Panel: Recent Labs  Lab 02/24/21 1107 02/25/21 0300 02/26/21 0121  NA 137 134* 135  K 4.0 3.7 3.7  CL 103 103 107  CO2 22 20* 22  GLUCOSE 130* 98 106*  BUN 24* 26* 23  CREATININE 1.02* 0.79 0.62  CALCIUM 9.3 8.5* 8.0*  MG  --  2.1  --   PHOS  --  3.2  --    GFR: Estimated Creatinine Clearance: 44.6 mL/min (by C-G formula based on SCr of 0.62 mg/dL). Liver Function Tests: Recent Labs  Lab 02/24/21 1107 02/25/21 0300  AST 164* 117*  ALT 61* 53*  ALKPHOS 54 42  BILITOT 1.4* 1.2  PROT 6.4* 5.0*  ALBUMIN 3.3* 2.5*   No results for input(s): LIPASE, AMYLASE in the last 168 hours. No results for input(s): AMMONIA in the last 168 hours. Coagulation Profile: No results for input(s): INR, PROTIME in the last 168 hours. Cardiac Enzymes: Recent Labs  Lab 02/24/21 1107 02/25/21 0300 02/26/21 0121  CKTOTAL 3,898* 1,718* 943*   BNP (last 3 results) No results for input(s): PROBNP in the last 8760 hours. HbA1C: No results for input(s): HGBA1C in the last 72 hours. CBG: No results for input(s): GLUCAP in the last 168 hours. Lipid Profile: No results for input(s): CHOL, HDL, LDLCALC, TRIG, CHOLHDL, LDLDIRECT in the last 72 hours. Thyroid Function Tests: No results for input(s): TSH, T4TOTAL, FREET4, T3FREE, THYROIDAB in the last 72 hours. Anemia Panel: No results for input(s): VITAMINB12, FOLATE, FERRITIN, TIBC, IRON, RETICCTPCT in the last 72 hours. Sepsis Labs: No results for input(s): PROCALCITON, LATICACIDVEN in the last 168 hours.  Recent  Results (from the past 240 hour(s))  Resp Panel by RT-PCR (Flu A&B, Covid) Nasopharyngeal Swab     Status: None   Collection Time: 02/24/21  9:56 PM   Specimen: Nasopharyngeal Swab; Nasopharyngeal(NP) swabs in vial transport medium  Result Value Ref Range Status   SARS Coronavirus 2 by RT PCR NEGATIVE NEGATIVE Final    Comment: (NOTE) SARS-CoV-2 target nucleic acids are NOT DETECTED.  The SARS-CoV-2 RNA is generally detectable in upper respiratory specimens during the acute phase of infection. The lowest concentration of SARS-CoV-2 viral copies this assay can detect is 138 copies/mL. A negative result does not preclude SARS-Cov-2 infection and should not be used as the sole basis for treatment or other patient management decisions. A negative result may occur with  improper specimen collection/handling, submission of specimen other than nasopharyngeal swab, presence of viral mutation(s) within the areas targeted by this assay, and inadequate number of viral copies(<138 copies/mL). A negative result must be combined with clinical observations, patient history, and  epidemiological information. The expected result is Negative.  Fact Sheet for Patients:  EntrepreneurPulse.com.au  Fact Sheet for Healthcare Providers:  IncredibleEmployment.be  This test is no t yet approved or cleared by the [place] FDA and  has been authorized for detection and/or diagnosis of SARS-CoV-2 by FDA under an Emergency Use Authorization (EUA). This EUA will remain  in effect (meaning this test can be used) for the duration of the COVID-19 declaration under Section 564(b)(1) of the Act, 21 U.S.C.section 360bbb-3(b)(1), unless the authorization is terminated  or revoked sooner.       Influenza A by PCR NEGATIVE NEGATIVE Final   Influenza B by PCR NEGATIVE NEGATIVE Final    Comment: (NOTE) The Xpert Xpress SARS-CoV-2/FLU/RSV plus assay is intended as an aid in the  diagnosis of influenza from Nasopharyngeal swab specimens and should not be used as a sole basis for treatment. Nasal washings and aspirates are unacceptable for Xpert Xpress SARS-CoV-2/FLU/RSV testing.  Fact Sheet for Patients: EntrepreneurPulse.com.au  Fact Sheet for Healthcare Providers: IncredibleEmployment.be  This test is not yet approved or cleared by the [place] FDA and has been authorized for detection and/or diagnosis of SARS-CoV-2 by FDA under an Emergency Use Authorization (EUA). This EUA will remain in effect (meaning this test can be used) for the duration of the COVID-19 declaration under Section 564(b)(1) of the Act, 21 U.S.C. section 360bbb-3(b)(1), unless the authorization is terminated or revoked.  Performed at [facility], [place] [address], [place], [place] [address]     Radiology Studies: ECHOCARDIOGRAM COMPLETE  Result Date: 02/25/2021    ECHOCARDIOGRAM REPORT   Patient Name:   BLENDA WISECUP Date of Exam: 02/25/2021 Medical Rec #:  [id number]          Height:       66.0 in Accession #:    [id number]         Weight:       140.0 lb Date of Birth:  [birth year]          BSA:          1.719 m Patient Age:    31 years           BP:           173/67 mmHg Patient Gender: F                  HR:           80 bpm. Exam Location:  Inpatient Procedure: 2D Echo, Color Doppler and Cardiac Doppler Indications:    R07.9* Chest pain, unspecified  History:        Patient has prior history of Echocardiogram examinations, most                 recent 12/25/2020. Aortic Valve Disease; Risk                 Factors:Hypertension and Dyslipidemia.  Sonographer:    Glo Herring Referring Phys: Stanton  1. Left ventricular ejection fraction, by estimation, is 60 to 65%. The left ventricle has normal function. The left ventricle has no regional wall motion abnormalities. There is severe concentric left ventricular  hypertrophy. Indeterminate diastolic filling due to E-A fusion.  2. Right ventricular systolic function is normal. The right ventricular size is normal. Tricuspid regurgitation signal is inadequate for assessing PA pressure.  3. The mitral valve is degenerative. No evidence of mitral valve regurgitation. No evidence of mitral stenosis.  4.  The aortic valve is calcified. There is severe calcifcation of the aortic valve. There is severe thickening of the aortic valve. Aortic valve regurgitation is not visualized. Moderate aortic valve stenosis. Aortic valve mean gradient measures 21.0 mmHg. Aortic valve Vmax measures 3.18 m/s.  5. The inferior vena cava is normal in size with <50% respiratory variability, suggesting right atrial pressure of 8 mmHg. FINDINGS  Left Ventricle: Left ventricular ejection fraction, by estimation, is 60 to 65%. The left ventricle has normal function. The left ventricle has no regional wall motion abnormalities. The left ventricular internal cavity size was normal in size. There is  severe concentric left ventricular hypertrophy. Indeterminate diastolic filling due to E-A fusion. Right Ventricle: The right ventricular size is normal. No increase in right ventricular wall thickness. Right ventricular systolic function is normal. Tricuspid regurgitation signal is inadequate for assessing PA pressure. Left Atrium: Left atrial size was normal in size. Right Atrium: Right atrial size was normal in size. Pericardium: There is no evidence of pericardial effusion. Mitral Valve: The mitral valve is degenerative in appearance. There is mild thickening of the mitral valve leaflet(s). There is moderate calcification of the anterior mitral valve leaflet(s). Mild to moderate mitral annular calcification. No evidence of mitral valve regurgitation. No evidence of mitral valve stenosis. Tricuspid Valve: The tricuspid valve is normal in structure. Tricuspid valve regurgitation is trivial. No evidence of  tricuspid stenosis. Aortic Valve: The aortic valve is calcified. There is severe calcifcation of the aortic valve. There is severe thickening of the aortic valve. Aortic valve regurgitation is not visualized. Moderate aortic stenosis is present. Aortic valve mean gradient measures 21.0 mmHg. Aortic valve peak gradient measures 40.4 mmHg. Pulmonic Valve: The pulmonic valve was normal in structure. Pulmonic valve regurgitation is trivial. No evidence of pulmonic stenosis. Aorta: The aortic root is normal in size and structure. Venous: The inferior vena cava is normal in size with less than 50% respiratory variability, suggesting right atrial pressure of 8 mmHg. IAS/Shunts: No atrial level shunt detected by color flow Doppler.  LEFT VENTRICLE PLAX 2D LVIDd:         2.90 cm LVIDs:         2.20 cm LV PW:         1.50 cm LV IVS:        1.50 cm  IVC IVC diam: 1.30 cm LEFT ATRIUM             Index LA diam:        3.20 cm 1.86 cm/m LA Vol (A2C):   35.3 ml 20.54 ml/m LA Vol (A4C):   40.0 ml 23.28 ml/m LA Biplane Vol: 38.6 ml 22.46 ml/m  AORTIC VALVE AV Vmax:           318.00 cm/s AV Vmean:          218.000 cm/s AV VTI:            0.538 m AV Peak Grad:      40.4 mmHg AV Mean Grad:      21.0 mmHg LVOT Vmax:         80.68 cm/s LVOT Vmean:        56.260 cm/s LVOT VTI:          0.158 m LVOT/AV VTI ratio: 0.29  AORTA Ao Root diam: 3.40 cm Ao Asc diam:  2.70 cm  SHUNTS Systemic VTI: 0.16 m Fransico Him MD Electronically signed by Fransico Him MD Signature Date/Time: 02/25/2021/1:57:48 PM    Final  Scheduled Meds:  amLODipine  5 mg Oral Daily   vitamin C  1,000 mg Oral Daily   [START ON 02/27/2021] aspirin EC  81 mg Oral Daily   calcium carbonate  1 tablet Oral Daily   carvedilol  6.25 mg Oral BID WC   docusate sodium  100 mg Oral BID   senna  1 tablet Oral BID   Continuous Infusions:  sodium chloride 75 mL/hr at 02/26/21 1444   cefTRIAXone (ROCEPHIN)  IV 1 g (02/26/21 0624)     LOS: 2 days    Time spent:  25 mins    Zoriyah Scheidegger, MD [facility]   If 7PM-7AM, please contact night-coverage
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
## 2021-02-26 NOTE — Progress Notes (Signed)
Mobility Specialist Progress Note:   02/26/21 1200  Mobility  Activity Ambulated in room  Level of Assistance Minimal assist, patient does 75% or more  Assistive Device Front wheel walker  Distance Ambulated (ft) 30 ft  Mobility Ambulated with assistance in room  Mobility Response Tolerated well  Mobility performed by Mobility specialist  Bed Position Chair  $Mobility charge 1 Mobility   Pt c/o generalized weakness during ambulation, otherwise asx. Pt back to chair for lunch.   Nelta Numbers Mobility Specialist  Phone [phone]

## 2021-02-26 NOTE — Progress Notes (Signed)
ANTICOAGULATION CONSULT NOTE - Follow Up   Pharmacy Consult for heparin Indication: chest pain/ACS  Patient Measurements: Height: 5\' 6"  (167.6 cm) Weight: 63.5 kg (140 lb) IBW/kg (Calculated) : 59.3 Heparin Dosing Weight: 65 kg   Vital Signs: Temp: 98.1 F (36.7 C) (10/30 2349) Temp Source: Oral (10/30 2349) BP: 162/62 (10/30 2349) Pulse Rate: 82 (10/30 2349)  Labs: Recent Labs    02/24/21 1107 02/24/21 1307 02/24/21 2330 02/25/21 0300 02/25/21 0958 02/26/21 0121  HGB 14.8  --   --  12.1  --  10.6*  HCT 44.8  --   --  36.7  --  32.0*  PLT 432*  --   --  346  --  309  HEPARINUNFRC  --   --  0.72*  --  0.33 0.15*  CREATININE 1.02*  --   --  0.79  --  0.62  CKTOTAL 3,898*  --   --  1,718*  --  943*  TROPONINIHS 2,358* 2,618*  --   --  1,597*  --      Estimated Creatinine Clearance: 44.6 mL/min (by C-G formula based on SCr of 0.62 mg/dL).  Assessment: 87 YOF who presents with pain in L shoulder found to have elevated troponin. Pharmacy consulted to start IV heparin.   Heparin level subtherapeutic (0.15) on gtt at 750 units/hr. No issues with line or bleeding reported per RN. Hgb down to 10.6.   Goal of Therapy:  Heparin level 0.3-0.7 units/ml Monitor platelets by anticoagulation protocol: Yes   Plan:  Increase heparin infusion to 900 units/hr  F/u 8 hr heparin level  Sherlon Handing, PharmD, BCPS Please see amion for complete clinical pharmacist phone list 02/26/2021 3:07 AM

## 2021-02-26 NOTE — [place] FL2 (Signed)
[place] MEDICAID FL2 LEVEL OF CARE SCREENING TOOL     IDENTIFICATION  Patient Name: GRIER CZERWINSKI Birthdate: 1929/06/25 Sex: female Admission Date (Current Location): 02/24/2021  [facility] and [place] Number:  Herbalist and Address:  [facility]. [facility], [place] [address], [place], [place] [address]      Provider Number: [id number]  Attending Physician Name and Address:  Shawna Clamp, MD  Relative Name and Phone Number:       Current Level of Care:   Recommended Level of Care: [place] Prior Approval Number:    Date Approved/Denied:   PASRR Number: [id number]  Discharge Plan: SNF    Current Diagnoses: Patient Active Problem List   Diagnosis Date Noted   Rhabdomyolysis 02/24/2021   Nonrheumatic aortic valve stenosis 06/26/2020   Aortic atherosclerosis ([place]) 06/26/2020   Hyperlipidemia 06/26/2020   RBBB 06/26/2020   Hyponatremia 05/07/2015   Essential hypertension 05/07/2015   Nausea & vomiting 05/07/2015   Back pain 05/07/2015   Mid back pain     Orientation RESPIRATION BLADDER Height & Weight     Self, Time, Place, Situation  Normal Incontinent, External catheter Weight: 140 lb (63.5 kg) Height:  5\' 6"  (167.6 cm)  BEHAVIORAL SYMPTOMS/MOOD NEUROLOGICAL BOWEL NUTRITION STATUS      Incontinent Diet  AMBULATORY STATUS COMMUNICATION OF NEEDS Skin   Limited Assist Verbally                         Personal Care Assistance Level of Assistance  Feeding, Bathing, Dressing Bathing Assistance: Limited assistance Feeding assistance: Independent Dressing Assistance: Limited assistance     Functional Limitations Info  Sight, Speech, Hearing Sight Info: Impaired Hearing Info: Impaired Speech Info: Adequate    SPECIAL CARE FACTORS FREQUENCY  PT (By licensed PT), OT (By licensed OT)     PT Frequency: 5x a week OT Frequency: 5x a week            Contractures Contractures Info: Not present     Additional Factors Info  Code Status, Allergies Code Status Info: full Allergies Info: [id number]           Current Medications (02/26/2021):  This is the current hospital active medication list Current Facility-Administered Medications  Medication Dose Route Frequency Provider Last Rate Last Admin   0.9 %  sodium chloride infusion   Intravenous Continuous Shawna Clamp, MD 75 mL/hr at 02/26/21 1444 Restarted at 02/26/21 1444   acetaminophen (TYLENOL) tablet 650 mg  650 mg Oral Q6H PRN Shawna Clamp, MD       Or   acetaminophen (TYLENOL) suppository 650 mg  650 mg Rectal Q6H PRN Shawna Clamp, MD       amLODipine (NORVASC) tablet 5 mg  5 mg Oral Daily Shawna Clamp, MD       ascorbic acid (VITAMIN C) tablet 1,000 mg  1,000 mg Oral Daily Shawna Clamp, MD   1,000 mg at 02/24/21 1653   [START ON 02/27/2021] aspirin EC tablet 81 mg  81 mg Oral Daily Buford Dresser, MD       calcium carbonate (OS-CAL - dosed in mg of elemental calcium) tablet 500 mg of elemental calcium  1 tablet Oral Daily Shawna Clamp, MD   500 mg of elemental calcium at 02/24/21 1653   carvedilol (COREG) tablet 6.25 mg  6.25 mg Oral BID WC Chotiner, Yevonne Aline, MD   6.25 mg at 02/26/21 0716   cefTRIAXone (ROCEPHIN) 1  g in sodium chloride 0.9 % 100 mL IVPB  1 g Intravenous Q0600 Shawna Clamp, MD 200 mL/hr at 02/26/21 0624 1 g at 02/26/21 0624   docusate sodium (COLACE) capsule 100 mg  100 mg Oral BID Shawna Clamp, MD   100 mg at 02/25/21 2131   ondansetron (ZOFRAN) tablet 4 mg  4 mg Oral Q6H PRN Shawna Clamp, MD       Or   ondansetron [facility]) injection 4 mg  4 mg Intravenous Q6H PRN Shawna Clamp, MD       senna Donavan Burnet) tablet 8.6 mg  1 tablet Oral BID Shawna Clamp, MD   8.6 mg at 02/25/21 2131     Discharge Medications: Please see discharge summary for a list of discharge medications.  Relevant Imaging Results:  Relevant Lab Results:   Additional Information SSN  [id number]  Emeterio Reeve, LCSW

## 2021-02-26 NOTE — Plan of Care (Signed)

## 2021-02-26 NOTE — Progress Notes (Signed)
Pt unable to void. Bladder scanned 770ml. #16 foley inserted with 614ml straw, cloudy with sediment returned.

## 2021-02-27 ENCOUNTER — Inpatient Hospital Stay (HOSPITAL_COMMUNITY): Payer: Medicare Other

## 2021-02-27 DIAGNOSIS — T796XXA Traumatic ischemia of muscle, initial encounter: Secondary | ICD-10-CM | POA: Diagnosis not present

## 2021-02-27 LAB — RESP PANEL BY RT-PCR (FLU A&B, COVID) ARPGX2
Influenza A by PCR: NEGATIVE
Influenza B by PCR: NEGATIVE
SARS Coronavirus 2 by RT PCR: NEGATIVE

## 2021-02-27 LAB — BASIC METABOLIC PANEL
Anion gap: 6 (ref 5–15)
BUN: 19 mg/dL (ref 8–23)
CO2: 19 mmol/L — ABNORMAL LOW (ref 22–32)
Calcium: 7.7 mg/dL — ABNORMAL LOW (ref 8.9–10.3)
Chloride: 108 mmol/L (ref 98–111)
Creatinine, Ser: 0.59 mg/dL (ref 0.44–1.00)
GFR, Estimated: >60 mL/min (ref >60)
Glucose, Bld: 98 mg/dL (ref 70–99)
Potassium: 3.6 mmol/L (ref 3.5–5.1)
Sodium: 133 mmol/L — ABNORMAL LOW (ref 135–145)

## 2021-02-27 LAB — CBC
HCT: 30.8 % — ABNORMAL LOW (ref 36.0–46.0)
Hemoglobin: 10.1 g/dL — ABNORMAL LOW (ref 12.0–15.0)
MCH: 27.9 pg (ref 26.0–34.0)
MCHC: 32.8 g/dL (ref 30.0–36.0)
MCV: 85.1 fL (ref 80.0–100.0)
Platelets: 305 10*3/uL (ref 150–400)
RBC: 3.62 MIL/uL — ABNORMAL LOW (ref 3.87–5.11)
RDW: 14.1 % (ref 11.5–15.5)
WBC: 10.3 10*3/uL (ref 4.0–10.5)
nRBC: 0 % (ref 0.0–0.2)

## 2021-02-27 LAB — CK: Total CK: 642 U/L — ABNORMAL HIGH (ref 38–234)

## 2021-02-27 LAB — HEPARIN LEVEL (UNFRACTIONATED): Heparin Unfractionated: <0.1 IU/mL — ABNORMAL LOW (ref 0.30–0.70)

## 2021-02-27 MED ORDER — TAMSULOSIN HCL 0.4 MG PO CAPS
0.4000 mg | ORAL_CAPSULE | Freq: Every day | ORAL | Status: DC
  Administered 2021-02-27 – 2021-02-28 (×2): 0.4 mg via ORAL
  Filled 2021-02-27 (×2): qty 1

## 2021-02-27 MED ORDER — CHLORHEXIDINE GLUCONATE CLOTH 2 % EX PADS
6.0000 | MEDICATED_PAD | Freq: Every day | CUTANEOUS | Status: DC
  Administered 2021-02-27 – 2021-02-28 (×2): 6 via TOPICAL

## 2021-02-27 NOTE — Care Management Important Message (Signed)
Important Message  Patient Details  Name: Cathy Silva MRN: [id number] Date of Birth: [birth year]   Medicare Important Message Given:  Yes     Marialena Wollen 02/27/2021, 2:47 PM

## 2021-02-27 NOTE — TOC Initial Note (Signed)
Transition of Care [facility]) - Initial/Assessment Note    Patient Details  Name: Cathy Silva MRN: [id number] Date of Birth: 1929-07-15  [facility]) CM/SW Contact:    Emeterio Reeve, LCSW Phone Number: 02/27/2021, 4:24 PM  Clinical Narrative:                  CSW received SNF consult. CSW met with pt at bedside. CSW introduced self and explained role at the hospital. Pt reports that PTA sh was living at hom alone. Pt was independent with mobility and ADL's. Pt was still driving.   CSW reviewed PT/OT recommendations for SNF. Pt reports she is fine with SNF for rehab. Pt gave CSW permission to fax out to facilities in the area. Pt has no preference of facility at this time. CSW gave pt medicare.gov rating list to review. CSW explained insurance auth process. Pt reports they are covid vaccinated.  4:00 [facility]. [facility] can take pt at discharge.   CSW will continue to follow.   Expected Discharge Plan: Skilled Nursing Facility Barriers to Discharge: Continued Medical Work up   Patient Goals and CMS Choice Patient states their goals for this hospitalization and ongoing recovery are:: to get stronger CMS Medicare.gov Compare Post Acute Care list provided to:: Patient Choice offered to / list presented to : Patient  Expected Discharge Plan and Services Expected Discharge Plan: [place] arrangements for the past 2 months: Single Family Home                                      Prior Living Arrangements/Services Living arrangements for the past 2 months: Single Family Home Lives with:: Self Patient language and need for interpreter reviewed:: Yes Do you feel safe going back to the place where you live?: Yes      Need for Family Participation in Patient Care: Yes (Comment) Care giver support system in place?: Yes (comment)   Criminal Activity/Legal Involvement Pertinent to Current  Situation/Hospitalization: No - Comment as needed  Activities of Daily Living      Permission Sought/Granted Permission sought to share information with : Facility Art therapist granted to share information with : Yes, Verbal Permission Granted     Permission granted to share info w AGENCY: SNF        Emotional Assessment Appearance:: Appears stated age Attitude/Demeanor/Rapport: Engaged Affect (typically observed): Happy, Pleasant, Appropriate Orientation: : Oriented to Self, Oriented to Place, Oriented to  Time, Oriented to Situation Alcohol / Substance Use: Not Applicable Psych Involvement: No (comment)  Admission diagnosis:  Rhabdomyolysis [M62.82] Fall [W19.XXXA] Patient Active Problem List   Diagnosis Date Noted   Rhabdomyolysis 02/24/2021   Nonrheumatic aortic valve stenosis 06/26/2020   Aortic atherosclerosis ([place]) 06/26/2020   Hyperlipidemia 06/26/2020   RBBB 06/26/2020   Hyponatremia 05/07/2015   Essential hypertension 05/07/2015   Nausea & vomiting 05/07/2015   Back pain 05/07/2015   Mid back pain    PCP:  [name], MD Pharmacy:   [place], [place] [address] Phone: [phone] Fax: [phone]     Social Determinants of Health (SDOH) Interventions    Readmission Risk Interventions No flowsheet data found.  Emeterio Reeve, LCSW Clinical Social Worker

## 2021-02-27 NOTE — Progress Notes (Signed)
Mobility Specialist Progress Note:   02/27/21 1500  Mobility  Activity Ambulated in room  Level of Assistance Contact guard assist, steadying assist  Assistive Device Front wheel walker  Distance Ambulated (ft) 65 ft  Mobility Ambulated with assistance in room  Mobility Response Tolerated well  Mobility performed by Mobility specialist  $Mobility charge 1 Mobility   Pt asx during ambulation. Showed improved strength from yesterday. Pt left in bed with RN present.   Nelta Numbers Mobility Specialist  Phone [phone]

## 2021-02-27 NOTE — Progress Notes (Signed)
PROGRESS NOTE    Cathy Silva  GDJ:[id number] DOB: 05/24/1929 DOA: 02/24/2021 PCP: [name], MD    Brief Narrative:  This 87 years old female with PMH significant for hyperlipidemia, hypertension and mitral valve prolapse,  history of basal cell carcinoma presented s/p mechanical fall.  Patient was unable to get up and remained on the floor for unknown duration.  Patient is admitted for rhabdomyolysis secondary to prolonged contact on the floor.  She is also found to have elevated troponin could be secondary to rhabdomyolysis.  Patient is started on heparin gtt. cardiology consulted,  echocardiogram LVEF 65 to 60%.  No plans for any ischemic work-up at this point. She has developed Urinary retention requiring foley catheterization.  Assessment & Plan:   Principal Problem:   Rhabdomyolysis Active Problems:   Essential hypertension   Back pain   Nonrheumatic aortic valve stenosis   Hyperlipidemia  Rhabdomyolysis s/p mechanical fall. Patient presented s/p fall, unable to get up,  remain on the floor for unknown duration. Patient denies any LOC, prodromal symptoms before fall, head trauma CK 3898 > 1718 > 943 > 642, Continue to trend CK. Patient received 2 L of LR , continue IV gentle hydration. Serum creatinine normal, continue to monitor. Skeletal work-up unremarkable. CT head: unremarkable.   Elevated troponin could be secondary to fall. Patient presented s/p fall with elevated troponins. Completed heparin x 48 hrs. Trop 6222 >9798 >1597 Patient denies any chest pain, EKG shows RBBB,  No ST-T wave changes. Cardiology consulted, continue aspirin. No plans for any invasive work-up at this time. Recommended outpatient follow-up.   Elevated liver enzymes: This could be secondary to fall LFTs trending down.   Leukocytosis: > Resolved. Could be reactive. There is no signs of any infection. Continue to trend.   Heart murmur: 2d echo: LVEF 60 to 65%.  No regional  wall motion abnormalities. No evidence of mitral stenosis or regurgitation.   Left shoulder  pain: X-ray concerning for nondisplaced fracture. Continue pain control.  Outpatient follow up.  UTI: Patient reported increased urinary frequency. Continue ceftriaxone x 5 days.  Acute urinary retention: Patient has developed urinary retention,  In and out cath for more than 24 hours now requiring Foley catheterization. Start Flomax.,  Discussed with Dr. Ernst Bowler. Patient can be discharged to skilled nursing facility with Foley catheter and outpatient follow-up. Renal ultrasound pending  DVT prophylaxis: Heparin gtt Code Status: Full code. Family Communication: No family at bedside Disposition Plan:   Status is: Inpatient  Remains inpatient appropriate because: Rhabdomyolysis and elevated troponins.  Patient is medically clear awaiting insurance authorization for SNF  Anticipated discharge to SNF in 1 to 2 days.  Consultants:  Cardiology  Procedures: Echo  Antimicrobials: Anti-infectives (From admission, onward)    Start     Dose/Rate Route Frequency Ordered Stop   02/25/21 0615  cefTRIAXone (ROCEPHIN) 1 g in sodium chloride 0.9 % 100 mL IVPB        1 g 200 mL/hr over 30 Minutes Intravenous Daily 02/25/21 0603 03/01/21 2359      Subjective: Patient was seen and examined at bedside.  Overnight events noted. She reports feeling much better.  She is concerned why she has developed urinary retention. She reports feeling better otherwise.  Objective: Vitals:   02/26/21 1619 02/26/21 2005 02/27/21 0333 02/27/21 0914  BP: (!) 149/69 (!) 127/51 (!) 179/65 (!) 124/51  Pulse: 74 86 89 92  Resp: 16 20 20 17   Temp: 98.5 F (36.9 C) 97.8  F (36.6 C) 98.6 F (37 C) 98.2 F (36.8 C)  TempSrc: Oral Axillary Axillary Oral  SpO2: 97% 98% 96% 95%  Weight:      Height:        Intake/Output Summary (Last 24 hours) at 02/27/2021 1413 Last data filed at 02/27/2021 1230 Gross per 24  hour  Intake 1815 ml  Output 1620 ml  Net 195 ml   Filed Weights   02/24/21 2141  Weight: 63.5 kg    Examination:  General exam: Appears comfortable, not in any acute distress.  Deconditioned Respiratory system: Clear to auscultation. Respiratory effort normal. RR 13 Cardiovascular system: S1-S2 heard, regular rate and rhythm, murmur+ Gastrointestinal system: Abdomen is soft, nontender, nondistended, BS+ Central nervous system: Alert and oriented. No focal neurological deficits. Extremities: Left shoulder bruising noted, tenderness improved , restricted movements+ Skin: No rashes, lesions or ulcers Psychiatry: Judgement and insight appear normal. Mood & affect appropriate.     Data Reviewed: I have personally reviewed following labs and imaging studies  CBC: Recent Labs  Lab 02/24/21 1107 02/25/21 0300 02/26/21 0121 02/27/21 0106  WBC 21.3* 18.1* 13.0* 10.3  NEUTROABS 19.0*  --   --   --   HGB 14.8 12.1 10.6* 10.1*  HCT 44.8 36.7 32.0* 30.8*  MCV 83.7 83.6 84.2 85.1  PLT 432* 346 309 937   Basic Metabolic Panel: Recent Labs  Lab 02/24/21 1107 02/25/21 0300 02/26/21 0121 02/27/21 0106  NA 137 134* 135 133*  K 4.0 3.7 3.7 3.6  CL 103 103 107 108  CO2 22 20* 22 19*  GLUCOSE 130* 98 106* 98  BUN 24* 26* 23 19  CREATININE 1.02* 0.79 0.62 0.59  CALCIUM 9.3 8.5* 8.0* 7.7*  MG  --  2.1  --   --   PHOS  --  3.2  --   --    GFR: Estimated Creatinine Clearance: 44.6 mL/min (by C-G formula based on SCr of 0.59 mg/dL). Liver Function Tests: Recent Labs  Lab 02/24/21 1107 02/25/21 0300  AST 164* 117*  ALT 61* 53*  ALKPHOS 54 42  BILITOT 1.4* 1.2  PROT 6.4* 5.0*  ALBUMIN 3.3* 2.5*   No results for input(s): LIPASE, AMYLASE in the last 168 hours. No results for input(s): AMMONIA in the last 168 hours. Coagulation Profile: No results for input(s): INR, PROTIME in the last 168 hours. Cardiac Enzymes: Recent Labs  Lab 02/24/21 1107 02/25/21 0300  02/26/21 0121 02/27/21 0106  CKTOTAL 3,898* 1,718* 943* 642*   BNP (last 3 results) No results for input(s): PROBNP in the last 8760 hours. HbA1C: No results for input(s): HGBA1C in the last 72 hours. CBG: No results for input(s): GLUCAP in the last 168 hours. Lipid Profile: No results for input(s): CHOL, HDL, LDLCALC, TRIG, CHOLHDL, LDLDIRECT in the last 72 hours. Thyroid Function Tests: No results for input(s): TSH, T4TOTAL, FREET4, T3FREE, THYROIDAB in the last 72 hours. Anemia Panel: No results for input(s): VITAMINB12, FOLATE, FERRITIN, TIBC, IRON, RETICCTPCT in the last 72 hours. Sepsis Labs: No results for input(s): PROCALCITON, LATICACIDVEN in the last 168 hours.  Recent Results (from the past 240 hour(s))  Resp Panel by RT-PCR (Flu A&B, Covid) Nasopharyngeal Swab     Status: None   Collection Time: 02/24/21  9:56 PM   Specimen: Nasopharyngeal Swab; Nasopharyngeal(NP) swabs in vial transport medium  Result Value Ref Range Status   SARS Coronavirus 2 by RT PCR NEGATIVE NEGATIVE Final    Comment: (NOTE) SARS-CoV-2 target nucleic acids  are NOT DETECTED.  The SARS-CoV-2 RNA is generally detectable in upper respiratory specimens during the acute phase of infection. The lowest concentration of SARS-CoV-2 viral copies this assay can detect is 138 copies/mL. A negative result does not preclude SARS-Cov-2 infection and should not be used as the sole basis for treatment or other patient management decisions. A negative result may occur with  improper specimen collection/handling, submission of specimen other than nasopharyngeal swab, presence of viral mutation(s) within the areas targeted by this assay, and inadequate number of viral copies(<138 copies/mL). A negative result must be combined with clinical observations, patient history, and epidemiological information. The expected result is Negative.  Fact Sheet for Patients:  EntrepreneurPulse.com.au  Fact  Sheet for Healthcare Providers:  IncredibleEmployment.be  This test is no t yet approved or cleared by the [place] FDA and  has been authorized for detection and/or diagnosis of SARS-CoV-2 by FDA under an Emergency Use Authorization (EUA). This EUA will remain  in effect (meaning this test can be used) for the duration of the COVID-19 declaration under Section 564(b)(1) of the Act, 21 U.S.C.section 360bbb-3(b)(1), unless the authorization is terminated  or revoked sooner.       Influenza A by PCR NEGATIVE NEGATIVE Final   Influenza B by PCR NEGATIVE NEGATIVE Final    Comment: (NOTE) The Xpert Xpress SARS-CoV-2/FLU/RSV plus assay is intended as an aid in the diagnosis of influenza from Nasopharyngeal swab specimens and should not be used as a sole basis for treatment. Nasal washings and aspirates are unacceptable for Xpert Xpress SARS-CoV-2/FLU/RSV testing.  Fact Sheet for Patients: EntrepreneurPulse.com.au  Fact Sheet for Healthcare Providers: IncredibleEmployment.be  This test is not yet approved or cleared by the [place] FDA and has been authorized for detection and/or diagnosis of SARS-CoV-2 by FDA under an Emergency Use Authorization (EUA). This EUA will remain in effect (meaning this test can be used) for the duration of the COVID-19 declaration under Section 564(b)(1) of the Act, 21 U.S.C. section 360bbb-3(b)(1), unless the authorization is terminated or revoked.  Performed at [facility], [place] [address], [place], [place] [address]   Urine Culture     Status: Abnormal (Preliminary result)   Collection Time: 02/25/21  2:18 AM   Specimen: Urine, Catheterized  Result Value Ref Range Status   Specimen Description URINE, CATHETERIZED  Final   Special Requests   Final    NONE Performed at [facility], [place] [address], [place], [place] [address]    Culture >=100,000 COLONIES/mL GRAM NEGATIVE  RODS (A)  Final   Report Status PENDING  Incomplete    Radiology Studies: No results found.   Scheduled Meds:  amLODipine  5 mg Oral Daily   vitamin C  1,000 mg Oral Daily   aspirin EC  81 mg Oral Daily   calcium carbonate  1 tablet Oral Daily   carvedilol  6.25 mg Oral BID WC   Chlorhexidine Gluconate Cloth  6 each Topical Q0600   docusate sodium  100 mg Oral BID   senna  1 tablet Oral BID   tamsulosin  0.4 mg Oral Daily   Continuous Infusions:  sodium chloride 75 mL/hr at 02/27/21 0548   cefTRIAXone (ROCEPHIN)  IV 1 g (02/27/21 0546)     LOS: 3 days    Time spent: 25 mins    Gizel Riedlinger, MD [facility]   If 7PM-7AM, please contact night-coverage

## 2021-02-28 DIAGNOSIS — R410 Disorientation, unspecified: Secondary | ICD-10-CM | POA: Diagnosis not present

## 2021-02-28 DIAGNOSIS — E860 Dehydration: Secondary | ICD-10-CM | POA: Diagnosis present

## 2021-02-28 DIAGNOSIS — I350 Nonrheumatic aortic (valve) stenosis: Secondary | ICD-10-CM | POA: Diagnosis not present

## 2021-02-28 DIAGNOSIS — N139 Obstructive and reflux uropathy, unspecified: Secondary | ICD-10-CM | POA: Diagnosis present

## 2021-02-28 DIAGNOSIS — R2681 Unsteadiness on feet: Secondary | ICD-10-CM | POA: Diagnosis not present

## 2021-02-28 DIAGNOSIS — Z515 Encounter for palliative care: Secondary | ICD-10-CM | POA: Diagnosis not present

## 2021-02-28 DIAGNOSIS — E785 Hyperlipidemia, unspecified: Secondary | ICD-10-CM | POA: Diagnosis present

## 2021-02-28 DIAGNOSIS — D72825 Bandemia: Secondary | ICD-10-CM | POA: Diagnosis not present

## 2021-02-28 DIAGNOSIS — R1312 Dysphagia, oropharyngeal phase: Secondary | ICD-10-CM | POA: Diagnosis not present

## 2021-02-28 DIAGNOSIS — R29707 NIHSS score 7: Secondary | ICD-10-CM | POA: Diagnosis present

## 2021-02-28 DIAGNOSIS — M6282 Rhabdomyolysis: Secondary | ICD-10-CM | POA: Diagnosis not present

## 2021-02-28 DIAGNOSIS — R778 Other specified abnormalities of plasma proteins: Secondary | ICD-10-CM | POA: Diagnosis not present

## 2021-02-28 DIAGNOSIS — I4510 Unspecified right bundle-branch block: Secondary | ICD-10-CM | POA: Diagnosis not present

## 2021-02-28 DIAGNOSIS — N179 Acute kidney failure, unspecified: Secondary | ICD-10-CM | POA: Diagnosis not present

## 2021-02-28 DIAGNOSIS — I639 Cerebral infarction, unspecified: Secondary | ICD-10-CM | POA: Diagnosis not present

## 2021-02-28 DIAGNOSIS — T796XXS Traumatic ischemia of muscle, sequela: Secondary | ICD-10-CM | POA: Diagnosis not present

## 2021-02-28 DIAGNOSIS — I63233 Cerebral infarction due to unspecified occlusion or stenosis of bilateral carotid arteries: Secondary | ICD-10-CM | POA: Diagnosis not present

## 2021-02-28 DIAGNOSIS — Z66 Do not resuscitate: Secondary | ICD-10-CM | POA: Diagnosis present

## 2021-02-28 DIAGNOSIS — G8191 Hemiplegia, unspecified affecting right dominant side: Secondary | ICD-10-CM | POA: Diagnosis present

## 2021-02-28 DIAGNOSIS — I248 Other forms of acute ischemic heart disease: Secondary | ICD-10-CM | POA: Diagnosis not present

## 2021-02-28 DIAGNOSIS — Z831 Family history of other infectious and parasitic diseases: Secondary | ICD-10-CM | POA: Diagnosis not present

## 2021-02-28 DIAGNOSIS — R54 Age-related physical debility: Secondary | ICD-10-CM | POA: Diagnosis present

## 2021-02-28 DIAGNOSIS — Z20822 Contact with and (suspected) exposure to covid-19: Secondary | ICD-10-CM | POA: Diagnosis present

## 2021-02-28 DIAGNOSIS — Z7189 Other specified counseling: Secondary | ICD-10-CM | POA: Diagnosis not present

## 2021-02-28 DIAGNOSIS — R339 Retention of urine, unspecified: Secondary | ICD-10-CM | POA: Diagnosis not present

## 2021-02-28 DIAGNOSIS — E7800 Pure hypercholesterolemia, unspecified: Secondary | ICD-10-CM | POA: Diagnosis present

## 2021-02-28 DIAGNOSIS — I480 Paroxysmal atrial fibrillation: Secondary | ICD-10-CM | POA: Diagnosis present

## 2021-02-28 DIAGNOSIS — R7989 Other specified abnormal findings of blood chemistry: Secondary | ICD-10-CM | POA: Diagnosis not present

## 2021-02-28 DIAGNOSIS — Z7982 Long term (current) use of aspirin: Secondary | ICD-10-CM | POA: Diagnosis not present

## 2021-02-28 DIAGNOSIS — Z888 Allergy status to other drugs, medicaments and biological substances status: Secondary | ICD-10-CM | POA: Diagnosis not present

## 2021-02-28 DIAGNOSIS — I63413 Cerebral infarction due to embolism of bilateral middle cerebral arteries: Secondary | ICD-10-CM | POA: Diagnosis not present

## 2021-02-28 DIAGNOSIS — R64 Cachexia: Secondary | ICD-10-CM | POA: Diagnosis present

## 2021-02-28 DIAGNOSIS — I700 Atherosclerosis of aorta: Secondary | ICD-10-CM | POA: Diagnosis not present

## 2021-02-28 DIAGNOSIS — T796XXA Traumatic ischemia of muscle, initial encounter: Principal | ICD-10-CM

## 2021-02-28 DIAGNOSIS — R338 Other retention of urine: Secondary | ICD-10-CM | POA: Diagnosis not present

## 2021-02-28 DIAGNOSIS — Z8249 Family history of ischemic heart disease and other diseases of the circulatory system: Secondary | ICD-10-CM | POA: Diagnosis not present

## 2021-02-28 DIAGNOSIS — T796XXD Traumatic ischemia of muscle, subsequent encounter: Secondary | ICD-10-CM | POA: Diagnosis not present

## 2021-02-28 DIAGNOSIS — D649 Anemia, unspecified: Secondary | ICD-10-CM | POA: Diagnosis present

## 2021-02-28 DIAGNOSIS — R531 Weakness: Secondary | ICD-10-CM | POA: Diagnosis not present

## 2021-02-28 DIAGNOSIS — E222 Syndrome of inappropriate secretion of antidiuretic hormone: Secondary | ICD-10-CM | POA: Diagnosis not present

## 2021-02-28 DIAGNOSIS — I63533 Cerebral infarction due to unspecified occlusion or stenosis of bilateral posterior cerebral arteries: Secondary | ICD-10-CM | POA: Diagnosis not present

## 2021-02-28 DIAGNOSIS — I63512 Cerebral infarction due to unspecified occlusion or stenosis of left middle cerebral artery: Secondary | ICD-10-CM | POA: Diagnosis present

## 2021-02-28 DIAGNOSIS — J9811 Atelectasis: Secondary | ICD-10-CM | POA: Diagnosis present

## 2021-02-28 DIAGNOSIS — R4781 Slurred speech: Secondary | ICD-10-CM | POA: Diagnosis present

## 2021-02-28 DIAGNOSIS — G319 Degenerative disease of nervous system, unspecified: Secondary | ICD-10-CM | POA: Diagnosis not present

## 2021-02-28 DIAGNOSIS — R262 Difficulty in walking, not elsewhere classified: Secondary | ICD-10-CM | POA: Diagnosis not present

## 2021-02-28 DIAGNOSIS — I10 Essential (primary) hypertension: Secondary | ICD-10-CM | POA: Diagnosis present

## 2021-02-28 DIAGNOSIS — G9340 Encephalopathy, unspecified: Secondary | ICD-10-CM | POA: Diagnosis not present

## 2021-02-28 DIAGNOSIS — G9341 Metabolic encephalopathy: Secondary | ICD-10-CM | POA: Diagnosis present

## 2021-02-28 DIAGNOSIS — R0902 Hypoxemia: Secondary | ICD-10-CM | POA: Diagnosis not present

## 2021-02-28 DIAGNOSIS — I080 Rheumatic disorders of both mitral and aortic valves: Secondary | ICD-10-CM | POA: Diagnosis not present

## 2021-02-28 DIAGNOSIS — I6300 Cerebral infarction due to thrombosis of unspecified precerebral artery: Secondary | ICD-10-CM | POA: Diagnosis not present

## 2021-02-28 DIAGNOSIS — E876 Hypokalemia: Secondary | ICD-10-CM | POA: Diagnosis not present

## 2021-02-28 DIAGNOSIS — M6281 Muscle weakness (generalized): Secondary | ICD-10-CM | POA: Diagnosis not present

## 2021-02-28 DIAGNOSIS — E871 Hypo-osmolality and hyponatremia: Secondary | ICD-10-CM | POA: Diagnosis not present

## 2021-02-28 DIAGNOSIS — Z7401 Bed confinement status: Secondary | ICD-10-CM | POA: Diagnosis not present

## 2021-02-28 DIAGNOSIS — R29810 Facial weakness: Secondary | ICD-10-CM | POA: Diagnosis present

## 2021-02-28 DIAGNOSIS — N390 Urinary tract infection, site not specified: Secondary | ICD-10-CM | POA: Diagnosis not present

## 2021-02-28 DIAGNOSIS — R4701 Aphasia: Secondary | ICD-10-CM | POA: Diagnosis present

## 2021-02-28 LAB — BASIC METABOLIC PANEL
Anion gap: 5 (ref 5–15)
BUN: 11 mg/dL (ref 8–23)
CO2: 22 mmol/L (ref 22–32)
Calcium: 7.8 mg/dL — ABNORMAL LOW (ref 8.9–10.3)
Chloride: 104 mmol/L (ref 98–111)
Creatinine, Ser: 0.63 mg/dL (ref 0.44–1.00)
GFR, Estimated: >60 mL/min (ref >60)
Glucose, Bld: 102 mg/dL — ABNORMAL HIGH (ref 70–99)
Potassium: 3.3 mmol/L — ABNORMAL LOW (ref 3.5–5.1)
Sodium: 131 mmol/L — ABNORMAL LOW (ref 135–145)

## 2021-02-28 LAB — URINE CULTURE: Culture: >=100000 — AB

## 2021-02-28 LAB — CK: Total CK: 388 U/L — ABNORMAL HIGH (ref 38–234)

## 2021-02-28 MED ORDER — AMLODIPINE BESYLATE 5 MG PO TABS
5.0000 mg | ORAL_TABLET | Freq: Every day | ORAL | 0 refills | Status: DC
Start: 2021-03-01 — End: 2021-03-13

## 2021-02-28 MED ORDER — LEVOFLOXACIN 250 MG PO TABS: 250.0000 mg | ORAL_TABLET | Freq: Every day | ORAL | 0 refills | Status: DC

## 2021-02-28 MED ORDER — POTASSIUM CHLORIDE CRYS ER 20 MEQ PO TBCR
40.0000 meq | EXTENDED_RELEASE_TABLET | Freq: Once | ORAL | Status: AC
  Administered 2021-02-28: 40 meq via ORAL
  Filled 2021-02-28: qty 2

## 2021-02-28 MED ORDER — CARVEDILOL 6.25 MG PO TABS: 6.2500 mg | ORAL_TABLET | Freq: Two times a day (BID) | ORAL | 0 refills | Status: DC

## 2021-02-28 MED ORDER — TAMSULOSIN HCL 0.4 MG PO CAPS: 0.4000 mg | ORAL_CAPSULE | Freq: Every day | ORAL | 0 refills | Status: AC

## 2021-02-28 MED ORDER — HYDRALAZINE HCL 20 MG/ML IJ SOLN
2.0000 mg | Freq: Once | INTRAMUSCULAR | Status: AC
  Administered 2021-02-28: 2 mg via INTRAVENOUS
  Filled 2021-02-28: qty 1

## 2021-02-28 MED ORDER — ASPIRIN 81 MG PO TBEC
81.0000 mg | DELAYED_RELEASE_TABLET | Freq: Every day | ORAL | 0 refills | Status: DC
Start: 2021-03-01 — End: 2021-05-24

## 2021-02-28 MED ORDER — LEVOFLOXACIN 500 MG PO TABS
250.0000 mg | ORAL_TABLET | Freq: Every day | ORAL | Status: DC
  Administered 2021-02-28: 250 mg via ORAL
  Filled 2021-02-28 (×2): qty 1

## 2021-02-28 MED ORDER — PROPOFOL 1000 MG/100ML IV EMUL
INTRAVENOUS | Status: AC
  Filled 2021-02-28: qty 100

## 2021-02-28 NOTE — Progress Notes (Signed)
Mobility Specialist Progress Note:   02/28/21 1010  Mobility  Activity Transferred:  Bed to chair;Ambulated in room  Level of Assistance Contact guard assist, steadying assist  Assistive Device Front wheel walker  Distance Ambulated (ft) 5 ft  Mobility Ambulated with assistance in room;Out of bed to chair with meals  Mobility Response Tolerated well  Mobility performed by Mobility specialist  Bed Position Chair  $Mobility charge 1 Mobility   Asx ambulation to chair. Pt displayed concern about generalized weakness. Ready for PT today, eager for d/c.  Nelta Numbers Mobility Specialist  Phone [phone]
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
## 2021-02-28 NOTE — TOC Transition Note (Signed)
Transition of Care [facility]) - CM/SW Discharge Note   Patient Details  Name: Cathy Silva MRN: [id number] Date of Birth: Jan 06, 1930  [facility]) CM/SW Contact:  Emeterio Reeve, LCSW Phone Number: 02/28/2021, 10:55 AM   Clinical Narrative:     Patient will DC to: [facility] Anticipated DC date: 02/28/21 Family notified: non, pt will notify Transport by: Corey Harold     Per MD patient ready for DC to  [facility]. RN, patient, and facility notified of DC. Discharge Summary and FL2 sent to facility. DC packet on chart. Insurance Josem Kaufmann has been received and pt is covid negative. Ambulance transport requested for patient.    RN to call report to [phone].  CSW will sign off for now as social work intervention is no longer needed. Please consult [place] again if new needs arise.   Final next level of care: Skilled Nursing Facility Barriers to Discharge: Barriers Resolved   Patient Goals and CMS Choice Patient states their goals for this hospitalization and ongoing recovery are:: to get stronger CMS Medicare.gov Compare Post Acute Care list provided to:: Patient Choice offered to / list presented to : Patient  Discharge Placement              Patient chooses bed at: [facility] Patient to be transferred to facility by: [facility] Name of family member notified: pt will notify Patient and family notified of of transfer: 02/28/21  Discharge Plan and Services                                     Social Determinants of Health (SDOH) Interventions     Readmission Risk Interventions No flowsheet data found.   Emeterio Reeve, LCSW Clinical Social Worker

## 2021-02-28 NOTE — Discharge Summary (Addendum)
Discharge Summary  Cathy Silva YTK:[id number] DOB: April 18, 1930  PCP: [name], MD  Admit date: 02/24/2021 Discharge date: 03/01/2021  Time spent: 35 minutes.  Recommendations for Outpatient Follow-up:  Follow-up with cardiology, please keep appointment. Follow-up with urology for a voiding trial in the office. Follow-up with your primary care provider. Please take your medications as prescribed Continue physical therapy with assistance and fall precautions.  Discharge Diagnoses:  Active Hospital Problems   Diagnosis Date Noted   Rhabdomyolysis 02/24/2021   Nonrheumatic aortic valve stenosis 06/26/2020   Hyperlipidemia 06/26/2020   Essential hypertension 05/07/2015   Back pain 05/07/2015    Resolved Hospital Problems  No resolved problems to display.    Discharge Condition: Stable  Diet recommendation: Heart healthy diet.  Vitals:   02/28/21 0515 02/28/21 0736  BP: (!) 182/68 (!) 182/89  Pulse: 81 84  Resp:  16  Temp:  98.6 F (37 C)  SpO2:  97%    History of present illness:  87 years old female with PMH significant for hyperlipidemia, hypertension and mitral valve prolapse, basal cell carcinoma presented s/p mechanical fall.  Patient was unable to get up and remained on the floor for unknown duration.  Patient was admitted for rhabdomyolysis secondary to prolonged full body contact on the floor.  She was also found to have elevated troponin, peaked at 2618 and down trended.  She received heparin drip and was seen by cardiology.  2D echo done on 02/25/2021 revealed LVEF 60 to 65%, the left ventricle had no regional wall motion abnormalities.  Heparin drip was stopped per cardiology's recommendation, signed off.  Cardiology recommended follow-up outpatient with Dr. Gasper Sells.  She developed acute urinary retention on 02/26/2021, 784 cc of urine retained in the bladder requiring foley catheterization.  She was started on Flomax 0.4 mg daily.  UA taken on  02/25/2021 positive for pyuria, urine culture positive for greater than 100,000 colonies of Pseudomonas aeruginosa, sensitivities to follow.  Discussed with urology, Dr. Matilde Sprang, Recommended to continue antibiotics x7 days and to keep the Foley catheter in place.  Patient will have a voiding trial in the office outpatient.  02/28/2021: Patient was seen and examined at her bedside.  She has no new complaints.  She denies dysuria, abdominal pain, nausea or vomiting.  Antibiotics were switched to p.o. Levaquin x7 days, patient asymptomatic at this time.  Hospital Course:  Principal Problem:   Rhabdomyolysis Active Problems:   Essential hypertension   Back pain   Nonrheumatic aortic valve stenosis   Hyperlipidemia  Traumatic Rhabdomyolysis s/p mechanical fall at home. Patient presented s/p fall, unable to get up,  remain on the floor for unknown duration. CK 3898 > 1718 > 943 > 642> 388. She received IV fluid hydration. Renal function is at baseline, creatinine 0.63 with GFR greater than 60. Skeletal work-up unremarkable. CT head: unremarkable.   Elevated troponin likely demand ischemia, seen by cardiology. Patient presented s/p fall with elevated troponins. Completed heparin x 48 hrs. Trop 5573 >2202 >1597 Patient denies any chest pain, EKG shows RBBB,  No ST-T wave changes. Seen by cardiology, signed off, continue aspirin. Recommended outpatient follow-up.  Pseudomonas aeruginosa UTI, POA UA obtained on 02/25/2021 positive for pyuria Urine culture positive for Pseudomonas aeruginosa, sensitivities are pending. Asymptomatic at the time of this visit Received 4 days of Rocephin, switched to p.o. Levaquin 250 mg daily x7 days.  Acute urinary retention Reported urinary retention on 02/26/2021 with 784 cc of urine retained in the bladder.  A  Foley catheter was inserted.  She was started on Flomax 0.4 mg daily.  Continue Flomax. Discussed with urology, Dr. Matilde Sprang, Recommended to  continue antibiotics x7 days and to keep the Foley catheter in place.  Patient will have a voiding trial in the office outpatient.   Elevated liver enzymes: Denies any abdominal pain. LFTs are downtrending. 2D echo was completed.  Left ventricle has no wall motion abnormalities.  LVEF was 60 to 65%.   Leukocytosis: > Resolved. Leukocytosis has resolved Received antibiotics for urinary tract infection.   Heart murmur: 2d echo: LVEF 60 to 65%.  No regional wall motion abnormalities. No evidence of mitral stenosis or regurgitation.   Left shoulder/elbow pain: Continue pain control.  Outpatient follow up. Left elbow x-ray, subtle lucency involving the superior corner of the olecranon most concerning for a nondisplaced fracture.  Hypokalemia K+ 3.3 Repleted orally  Hyponatremia Na+ 131 Advised to increase oral intake     Code Status: Full code.    Consultants:  Cardiology. Curb sided with urology via phone.   Procedures: 2D Echo      Discharge Exam: BP (!) 182/89 (BP Location: Left Arm)   Pulse 84   Temp 98.6 F (37 C) (Oral)   Resp 16   Ht 5\' 6"  (1.676 m)   Wt 63.5 kg   LMP 05/31/1987   SpO2 97%   BMI 22.60 kg/m  General: 87 y.o. year-old female well developed well nourished in no acute distress.  Alert and oriented x3. Cardiovascular: Regular rate and rhythm with no rubs or gallops.  No thyromegaly or JVD noted.   Respiratory: Clear to auscultation with no wheezes or rales. Good inspiratory effort. Abdomen: Soft nontender nondistended with normal bowel sounds x4 quadrants. Musculoskeletal: No lower extremity edema. 2/4 pulses in all 4 extremities. Skin: No ulcerative lesions noted or rashes, Psychiatry: Mood is appropriate for condition and setting  Discharge Instructions You were cared for by a hospitalist during your hospital stay. If you have any questions about your discharge medications or the care you received while you were in the hospital after you  are discharged, you can call the unit and asked to speak with the hospitalist on call if the hospitalist that took care of you is not available. Once you are discharged, your primary care physician will handle any further medical issues. Please note that NO REFILLS for any discharge medications will be authorized once you are discharged, as it is imperative that you return to your primary care physician (or establish a relationship with a primary care physician if you do not have one) for your aftercare needs so that they can reassess your need for medications and monitor your lab values.   Allergies as of 02/28/2021       Reactions   Novocain [procaine] Shortness Of Breath, Other (See Comments)   "Deathly allergic to this"   Codeine Other (See Comments)   Reaction not recalled by the patient- stated it was "a long time ago"   Pneumococcal Vaccines Other (See Comments)   Reaction not recalled by the patient- stated it was "a long time ago"   Pseudoephedrine Other (See Comments)   Reaction not recalled by the patient- stated it was "a long time ago"   Tetanus Toxoids Swelling, Other (See Comments)   Swelling at injecton site   Tetanus-diphtheria Toxoids Td Swelling, Other (See Comments)   Swelling where injected        Medication List     TAKE these medications  amLODipine 5 MG tablet Commonly known as: NORVASC Take 1 tablet (5 mg total) by mouth daily. What changed:  medication strength how much to take   aspirin 81 MG EC tablet Take 1 tablet (81 mg total) by mouth daily. Swallow whole.   CALCIUM + D PO Take 1,200 mg by mouth.   carvedilol 6.25 MG tablet Commonly known as: COREG Take 1 tablet (6.25 mg total) by mouth 2 (two) times daily with a meal.   feeding supplement Liqd Take 1 Container by mouth 3 (three) times daily between meals. What changed: when to take this   Fish Oil 600 MG Caps Take 600 mg by mouth in the morning and at bedtime.   levofloxacin 250 MG  tablet Commonly known as: LEVAQUIN Take 1 tablet (250 mg total) by mouth daily for 7 days.   MULTIVITAMIN PO Take 1 tablet by mouth daily with breakfast.   tamsulosin 0.4 MG Caps capsule Commonly known as: FLOMAX Take 1 capsule (0.4 mg total) by mouth daily.   vitamin C 1000 MG tablet Take 1,000 mg by mouth daily.       Allergies  Allergen Reactions   Novocain [Procaine] Shortness Of Breath and Other (See Comments)    "Deathly allergic to this"   Codeine Other (See Comments)    Reaction not recalled by the patient- stated it was "a long time ago"   Pneumococcal Vaccines Other (See Comments)    Reaction not recalled by the patient- stated it was "a long time ago"   Pseudoephedrine Other (See Comments)    Reaction not recalled by the patient- stated it was "a long time ago"   Tetanus Toxoids Swelling and Other (See Comments)    Swelling at injecton site   Tetanus-Diphtheria Toxoids Td Swelling and Other (See Comments)    Swelling where injected    Follow-up Information     Richardson Dopp T, PA-C Follow up.   Specialties: Cardiology, Physician Assistant Why: Hospital follow-up with Cardiology scheduled for 03/21/2021 at 2:20pm. Please arrive 15 minutes early for check-in. If this date/time does not work for you, please call our office to reschedule. Contact information: [address] [place] [address] [phone]         [name], MD. Call today.   Specialty: Family Medicine Why: Please call for a posthospital follow-up appointment. Contact information: [place] [address] [phone]         Werner Lean, MD .   Specialty: Cardiology Contact information: [address] [address] [phone]         Alexis Frock, MD. Call today.   Specialty: Urology Why: Please call for a posthospital follow-up appointment. Contact information: [place] [address] [phone]         Alexis Frock, MD. Call today.   Specialty: Urology Why: Please call for a posthospital follow-up appointment, and for a voiding trial in the office.  Thank you Contact information: [place] [address] [phone]                  The results of significant diagnostics from this hospitalization (including imaging, microbiology, ancillary and laboratory) are listed below for reference.    Significant Diagnostic Studies: DG Elbow Complete Left  Result Date: 02/24/2021 CLINICAL DATA:  Status post fall, elbow pain EXAM: LEFT ELBOW - COMPLETE 3+ VIEW COMPARISON:  None. FINDINGS: Subtle lucency involving  the superior corner of the olecranon most concerning for a nondisplaced fracture. No other fracture or dislocation. No aggressive osseous lesion. Normal alignment. Soft tissue are unremarkable. No radiopaque foreign body or soft tissue emphysema. IMPRESSION: 1. Subtle lucency involving the superior corner of the olecranon most concerning for a nondisplaced fracture. Electronically Signed   By: Kathreen Devoid M.D.   On: 02/24/2021 12:44   CT Head Wo Contrast  Result Date: 02/24/2021 CLINICAL DATA:  87 year old female with acute fall and head injury. Initial encounter. EXAM: CT HEAD WITHOUT CONTRAST TECHNIQUE: Contiguous axial images were obtained from the base of the skull through the vertex without intravenous contrast. COMPARISON:  None. FINDINGS: Brain: Obscuration of a portion of the RIGHT insular cortex is noted (series 3: Image 15-16) compatible with an infarct of uncertain chronicity, but appears more remote. No evidence of hemorrhage, hydrocephalus, extra-axial collection or mass lesion/mass effect. Atrophy identified. Mild periventricular white matter hypodensities likely represent chronic small vessel white matter ischemic changes. Vascular: Carotid and vertebral atherosclerotic calcifications are noted. Skull: No  acute abnormality. Sinuses/Orbits: No acute abnormality Other: None IMPRESSION: 1. 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 Correlate clinically. No evidence of hemorrhage. 2. Atrophy and probable chronic small vessel white matter ischemic changes. Electronically Signed   By: Margarette Canada M.D.   On: 02/24/2021 12:01   US RENAL  Result Date: 02/27/2021 CLINICAL DATA:  Urinary retention EXAM: RENAL / URINARY TRACT ULTRASOUND COMPLETE COMPARISON:  None. FINDINGS: Right Kidney: Renal measurements: 9.7 x 4.7 x 5.2 cm = volume: 124 mL. Echogenicity within normal limits. No mass or hydronephrosis visualized. Left Kidney: Renal measurements: 10.9 x 4.5 x 4.6 cm = volume: 116 mL. Echogenicity within normal limits. There is a 1.1 x 0.8 x 0.9 cm simple cyst within the left kidney. No solid mass or hydronephrosis visualized. Urinary bladder: Appears normal for degree of bladder distention. Foley catheter noted within the urinary bladder lumen. Other: None. IMPRESSION: Unremarkable renal ultrasound. Electronically Signed   By: Iven Finn M.D.   On: 02/27/2021 15:03   DG Shoulder Left  Result Date: 02/24/2021 CLINICAL DATA:  Status post fall, shoulder pain EXAM: LEFT SHOULDER - 2+ VIEW COMPARISON:  None. FINDINGS: No acute fracture or dislocation. No aggressive osseous lesion. Normal alignment. Generalized osteopenia. Mild osteoarthritis of the glenohumeral joint and acromioclavicular joint. Soft tissue are unremarkable. No radiopaque foreign body or soft tissue emphysema. IMPRESSION: No acute osseous injury of the left shoulder. Electronically Signed   By: Kathreen Devoid M.D.   On: 02/24/2021 12:45   ECHOCARDIOGRAM COMPLETE  Result Date: 02/25/2021    ECHOCARDIOGRAM REPORT   Patient Name:   Cathy Silva Date of Exam: 02/25/2021 Medical Rec #:  [id number]          Height:       66.0 in Accession #:    [id number]         Weight:       140.0 lb Date of Birth:  14-Jul-1929           BSA:          1.719 m Patient Age:    70 years           BP:           173/67 mmHg Patient Gender: F                  HR:  80 bpm. Exam Location:  Inpatient Procedure: 2D Echo, Color Doppler and Cardiac Doppler Indications:    R07.9* Chest pain, unspecified  History:        Patient has prior history of Echocardiogram examinations, most                 recent 12/25/2020. Aortic Valve Disease; Risk                 Factors:Hypertension and Dyslipidemia.  Sonographer:    Glo Herring Referring Phys: [place]  1. Left ventricular ejection fraction, by estimation, is 60 to 65%. The left ventricle has normal function. The left ventricle has no regional wall motion abnormalities. There is severe concentric left ventricular hypertrophy. Indeterminate diastolic filling due to E-A fusion.  2. Right ventricular systolic function is normal. The right ventricular size is normal. Tricuspid regurgitation signal is inadequate for assessing PA pressure.  3. The mitral valve is degenerative. No evidence of mitral valve regurgitation. No evidence of mitral stenosis.  4. The aortic valve is calcified. There is severe calcifcation of the aortic valve. There is severe thickening of the aortic valve. Aortic valve regurgitation is not visualized. Moderate aortic valve stenosis. Aortic valve mean gradient measures 21.0 mmHg. Aortic valve Vmax measures 3.18 m/s.  5. The inferior vena cava is normal in size with <50% respiratory variability, suggesting right atrial pressure of 8 mmHg. FINDINGS  Left Ventricle: Left ventricular ejection fraction, by estimation, is 60 to 65%. The left ventricle has normal function. The left ventricle has no regional wall motion abnormalities. The left ventricular internal cavity size was normal in size. There is  severe concentric left ventricular hypertrophy. Indeterminate diastolic filling due to E-A fusion. Right Ventricle: The right ventricular size is normal. No  increase in right ventricular wall thickness. Right ventricular systolic function is normal. Tricuspid regurgitation signal is inadequate for assessing PA pressure. Left Atrium: Left atrial size was normal in size. Right Atrium: Right atrial size was normal in size. Pericardium: There is no evidence of pericardial effusion. Mitral Valve: The mitral valve is degenerative in appearance. There is mild thickening of the mitral valve leaflet(s). There is moderate calcification of the anterior mitral valve leaflet(s). Mild to moderate mitral annular calcification. No evidence of mitral valve regurgitation. No evidence of mitral valve stenosis. Tricuspid Valve: The tricuspid valve is normal in structure. Tricuspid valve regurgitation is trivial. No evidence of tricuspid stenosis. Aortic Valve: The aortic valve is calcified. There is severe calcifcation of the aortic valve. There is severe thickening of the aortic valve. Aortic valve regurgitation is not visualized. Moderate aortic stenosis is present. Aortic valve mean gradient measures 21.0 mmHg. Aortic valve peak gradient measures 40.4 mmHg. Pulmonic Valve: The pulmonic valve was normal in structure. Pulmonic valve regurgitation is trivial. No evidence of pulmonic stenosis. Aorta: The aortic root is normal in size and structure. Venous: The inferior vena cava is normal in size with less than 50% respiratory variability, suggesting right atrial pressure of 8 mmHg. IAS/Shunts: No atrial level shunt detected by color flow Doppler.  LEFT VENTRICLE PLAX 2D LVIDd:         2.90 cm LVIDs:         2.20 cm LV PW:         1.50 cm LV IVS:        1.50 cm  IVC IVC diam: 1.30 cm LEFT ATRIUM             Index LA diam:  3.20 cm 1.86 cm/m LA Vol (A2C):   35.3 ml 20.54 ml/m LA Vol (A4C):   40.0 ml 23.28 ml/m LA Biplane Vol: 38.6 ml 22.46 ml/m  AORTIC VALVE AV Vmax:           318.00 cm/s AV Vmean:          218.000 cm/s AV VTI:            0.538 m AV Peak Grad:      40.4 mmHg AV  Mean Grad:      21.0 mmHg LVOT Vmax:         80.68 cm/s LVOT Vmean:        56.260 cm/s LVOT VTI:          0.158 m LVOT/AV VTI ratio: 0.29  AORTA Ao Root diam: 3.40 cm Ao Asc diam:  2.70 cm  SHUNTS Systemic VTI: 0.16 m Fransico Him MD Electronically signed by Fransico Him MD Signature Date/Time: 02/25/2021/1:57:48 PM    Final    DG Hip Unilat With Pelvis 2-3 Views Left  Result Date: 02/24/2021 CLINICAL DATA:  Status post fall, bilateral hip pain EXAM: DG HIP (WITH OR WITHOUT PELVIS) 2-3V RIGHT; LEFT FEMUR 2 VIEWS; RIGHT FEMUR 2 VIEWS; DG HIP (WITH OR WITHOUT PELVIS) 2-3V LEFT COMPARISON:  None. FINDINGS: No acute fracture or dislocation. No aggressive osseous lesion. Normal alignment. Generalized osteopenia. Mild osteoarthritis of the hips bilaterally. Moderate left medial femorotibial compartment osteoarthritis. Mild osteoarthritis of the right medial and lateral femorotibial compartments as well as the left lateral femorotibial compartment. Soft tissue are unremarkable. No radiopaque foreign body or soft tissue emphysema. Peripheral vascular atherosclerotic disease. IMPRESSION: 1.  No acute osseous injury of the right hip. 2.  No acute osseous injury of the left hip. 3.  No acute osseous injury of the right femur. 4.  No acute osseous injury of the left femur. 5. Given the patient's age and osteopenia, if there is persistent clinical concern for an occult hip fracture, a MRI of the hip is recommended for increased sensitivity. Electronically Signed   By: Kathreen Devoid M.D.   On: 02/24/2021 12:52   DG Hip Unilat With Pelvis 2-3 Views Right  Result Date: 02/24/2021 CLINICAL DATA:  Status post fall, bilateral hip pain EXAM: DG HIP (WITH OR WITHOUT PELVIS) 2-3V RIGHT; LEFT FEMUR 2 VIEWS; RIGHT FEMUR 2 VIEWS; DG HIP (WITH OR WITHOUT PELVIS) 2-3V LEFT COMPARISON:  None. FINDINGS: No acute fracture or dislocation. No aggressive osseous lesion. Normal alignment. Generalized osteopenia. Mild osteoarthritis of the  hips bilaterally. Moderate left medial femorotibial compartment osteoarthritis. Mild osteoarthritis of the right medial and lateral femorotibial compartments as well as the left lateral femorotibial compartment. Soft tissue are unremarkable. No radiopaque foreign body or soft tissue emphysema. Peripheral vascular atherosclerotic disease. IMPRESSION: 1.  No acute osseous injury of the right hip. 2.  No acute osseous injury of the left hip. 3.  No acute osseous injury of the right femur. 4.  No acute osseous injury of the left femur. 5. Given the patient's age and osteopenia, if there is persistent clinical concern for an occult hip fracture, a MRI of the hip is recommended for increased sensitivity. Electronically Signed   By: Kathreen Devoid M.D.   On: 02/24/2021 12:52   DG FEMUR MIN 2 VIEWS LEFT  Result Date: 02/24/2021 CLINICAL DATA:  Status post fall, bilateral hip pain EXAM: DG HIP (WITH OR WITHOUT PELVIS) 2-3V RIGHT; LEFT FEMUR 2 VIEWS; RIGHT FEMUR 2 VIEWS; DG  HIP (WITH OR WITHOUT PELVIS) 2-3V LEFT COMPARISON:  None. FINDINGS: No acute fracture or dislocation. No aggressive osseous lesion. Normal alignment. Generalized osteopenia. Mild osteoarthritis of the hips bilaterally. Moderate left medial femorotibial compartment osteoarthritis. Mild osteoarthritis of the right medial and lateral femorotibial compartments as well as the left lateral femorotibial compartment. Soft tissue are unremarkable. No radiopaque foreign body or soft tissue emphysema. Peripheral vascular atherosclerotic disease. IMPRESSION: 1.  No acute osseous injury of the right hip. 2.  No acute osseous injury of the left hip. 3.  No acute osseous injury of the right femur. 4.  No acute osseous injury of the left femur. 5. Given the patient's age and osteopenia, if there is persistent clinical concern for an occult hip fracture, a MRI of the hip is recommended for increased sensitivity. Electronically Signed   By: Kathreen Devoid M.D.   On:  02/24/2021 12:52   DG FEMUR, MIN 2 VIEWS RIGHT  Result Date: 02/24/2021 CLINICAL DATA:  Status post fall, bilateral hip pain EXAM: DG HIP (WITH OR WITHOUT PELVIS) 2-3V RIGHT; LEFT FEMUR 2 VIEWS; RIGHT FEMUR 2 VIEWS; DG HIP (WITH OR WITHOUT PELVIS) 2-3V LEFT COMPARISON:  None. FINDINGS: No acute fracture or dislocation. No aggressive osseous lesion. Normal alignment. Generalized osteopenia. Mild osteoarthritis of the hips bilaterally. Moderate left medial femorotibial compartment osteoarthritis. Mild osteoarthritis of the right medial and lateral femorotibial compartments as well as the left lateral femorotibial compartment. Soft tissue are unremarkable. No radiopaque foreign body or soft tissue emphysema. Peripheral vascular atherosclerotic disease. IMPRESSION: 1.  No acute osseous injury of the right hip. 2.  No acute osseous injury of the left hip. 3.  No acute osseous injury of the right femur. 4.  No acute osseous injury of the left femur. 5. Given the patient's age and osteopenia, if there is persistent clinical concern for an occult hip fracture, a MRI of the hip is recommended for increased sensitivity. Electronically Signed   By: Kathreen Devoid M.D.   On: 02/24/2021 12:52    Microbiology: Recent Results (from the past 240 hour(s))  Resp Panel by RT-PCR (Flu A&B, Covid) Nasopharyngeal Swab     Status: None   Collection Time: 02/24/21  9:56 PM   Specimen: Nasopharyngeal Swab; Nasopharyngeal(NP) swabs in vial transport medium  Result Value Ref Range Status   SARS Coronavirus 2 by RT PCR NEGATIVE NEGATIVE Final    Comment: (NOTE) SARS-CoV-2 target nucleic acids are NOT DETECTED.  The SARS-CoV-2 RNA is generally detectable in upper respiratory specimens during the acute phase of infection. The lowest concentration of SARS-CoV-2 viral copies this assay can detect is 138 copies/mL. A negative result does not preclude SARS-Cov-2 infection and should not be used as the sole basis for treatment  or other patient management decisions. A negative result may occur with  improper specimen collection/handling, submission of specimen other than nasopharyngeal swab, presence of viral mutation(s) within the areas targeted by this assay, and inadequate number of viral copies(<138 copies/mL). A negative result must be combined with clinical observations, patient history, and epidemiological information. The expected result is Negative.  Fact Sheet for Patients:  EntrepreneurPulse.com.au  Fact Sheet for Healthcare Providers:  IncredibleEmployment.be  This test is no t yet approved or cleared by the [place] FDA and  has been authorized for detection and/or diagnosis of SARS-CoV-2 by FDA under an Emergency Use Authorization (EUA). This EUA will remain  in effect (meaning this test can be used) for the duration of the COVID-19 declaration  under Section 564(b)(1) of the Act, 21 U.S.C.section 360bbb-3(b)(1), unless the authorization is terminated  or revoked sooner.       Influenza A by PCR NEGATIVE NEGATIVE Final   Influenza B by PCR NEGATIVE NEGATIVE Final    Comment: (NOTE) The Xpert Xpress SARS-CoV-2/FLU/RSV plus assay is intended as an aid in the diagnosis of influenza from Nasopharyngeal swab specimens and should not be used as a sole basis for treatment. Nasal washings and aspirates are unacceptable for Xpert Xpress SARS-CoV-2/FLU/RSV testing.  Fact Sheet for Patients: EntrepreneurPulse.com.au  Fact Sheet for Healthcare Providers: IncredibleEmployment.be  This test is not yet approved or cleared by the [place] FDA and has been authorized for detection and/or diagnosis of SARS-CoV-2 by FDA under an Emergency Use Authorization (EUA). This EUA will remain in effect (meaning this test can be used) for the duration of the COVID-19 declaration under Section 564(b)(1) of the Act, 21 U.S.C. section  360bbb-3(b)(1), unless the authorization is terminated or revoked.  Performed at [facility], [place] [address], [place], [place] [address]   Urine Culture     Status: Abnormal   Collection Time: 02/25/21  2:18 AM   Specimen: Urine, Catheterized  Result Value Ref Range Status   Specimen Description URINE, CATHETERIZED  Final   Special Requests   Final    NONE Performed at [facility], [place] [address] [place], [place] [address]    Culture >=100,000 COLONIES/mL PSEUDOMONAS AERUGINOSA (A)  Final   Report Status 02/28/2021 FINAL  Final   Organism ID, Bacteria PSEUDOMONAS AERUGINOSA (A)  Final      Susceptibility   Pseudomonas aeruginosa - MIC*    CEFTAZIDIME 4 SENSITIVE Sensitive     CIPROFLOXACIN <=0.25 SENSITIVE Sensitive     GENTAMICIN <=1 SENSITIVE Sensitive     IMIPENEM <=0.25 SENSITIVE Sensitive     PIP/TAZO <=4 SENSITIVE Sensitive     CEFEPIME 0.25 SENSITIVE Sensitive     * >=100,000 COLONIES/mL PSEUDOMONAS AERUGINOSA  Resp Panel by RT-PCR (Flu A&B, Covid) Nasopharyngeal Swab     Status: None   Collection Time: 02/27/21  4:37 PM   Specimen: Nasopharyngeal Swab; Nasopharyngeal(NP) swabs in vial transport medium  Result Value Ref Range Status   SARS Coronavirus 2 by RT PCR NEGATIVE NEGATIVE Final    Comment: (NOTE) SARS-CoV-2 target nucleic acids are NOT DETECTED.  The SARS-CoV-2 RNA is generally detectable in upper respiratory specimens during the acute phase of infection. The lowest concentration of SARS-CoV-2 viral copies this assay can detect is 138 copies/mL. A negative result does not preclude SARS-Cov-2 infection and should not be used as the sole basis for treatment or other patient management decisions. A negative result may occur with  improper specimen collection/handling, submission of specimen other than nasopharyngeal swab, presence of viral mutation(s) within the areas targeted by this assay, and inadequate number of viral copies(<138 copies/mL).  A negative result must be combined with clinical observations, patient history, and epidemiological information. The expected result is Negative.  Fact Sheet for Patients:  EntrepreneurPulse.com.au  Fact Sheet for Healthcare Providers:  IncredibleEmployment.be  This test is no t yet approved or cleared by the [place] FDA and  has been authorized for detection and/or diagnosis of SARS-CoV-2 by FDA under an Emergency Use Authorization (EUA). This EUA will remain  in effect (meaning this test can be used) for the duration of the COVID-19 declaration under Section 564(b)(1) of the Act, 21 U.S.C.section 360bbb-3(b)(1), unless the authorization is terminated  or  revoked sooner.       Influenza A by PCR NEGATIVE NEGATIVE Final   Influenza B by PCR NEGATIVE NEGATIVE Final    Comment: (NOTE) The Xpert Xpress SARS-CoV-2/FLU/RSV plus assay is intended as an aid in the diagnosis of influenza from Nasopharyngeal swab specimens and should not be used as a sole basis for treatment. Nasal washings and aspirates are unacceptable for Xpert Xpress SARS-CoV-2/FLU/RSV testing.  Fact Sheet for Patients: EntrepreneurPulse.com.au  Fact Sheet for Healthcare Providers: IncredibleEmployment.be  This test is not yet approved or cleared by the [place] FDA and has been authorized for detection and/or diagnosis of SARS-CoV-2 by FDA under an Emergency Use Authorization (EUA). This EUA will remain in effect (meaning this test can be used) for the duration of the COVID-19 declaration under Section 564(b)(1) of the Act, 21 U.S.C. section 360bbb-3(b)(1), unless the authorization is terminated or revoked.  Performed at [facility], [place] [address] [place], [place] [address]      Labs: Basic Metabolic Panel: Recent Labs  Lab 02/24/21 1107 02/25/21 0300 02/26/21 0121 02/27/21 0106 02/28/21 0119  NA 137 134* 135  133* 131*  K 4.0 3.7 3.7 3.6 3.3*  CL 103 103 107 108 104  CO2 22 20* 22 19* 22  GLUCOSE 130* 98 106* 98 102*  BUN 24* 26* 23 19 11   CREATININE 1.02* 0.79 0.62 0.59 0.63  CALCIUM 9.3 8.5* 8.0* 7.7* 7.8*  MG  --  2.1  --   --   --   PHOS  --  3.2  --   --   --    Liver Function Tests: Recent Labs  Lab 02/24/21 1107 02/25/21 0300  AST 164* 117*  ALT 61* 53*  ALKPHOS 54 42  BILITOT 1.4* 1.2  PROT 6.4* 5.0*  ALBUMIN 3.3* 2.5*   No results for input(s): LIPASE, AMYLASE in the last 168 hours. No results for input(s): AMMONIA in the last 168 hours. CBC: Recent Labs  Lab 02/24/21 1107 02/25/21 0300 02/26/21 0121 02/27/21 0106  WBC 21.3* 18.1* 13.0* 10.3  NEUTROABS 19.0*  --   --   --   HGB 14.8 12.1 10.6* 10.1*  HCT 44.8 36.7 32.0* 30.8*  MCV 83.7 83.6 84.2 85.1  PLT 432* 346 309 305   Cardiac Enzymes: Recent Labs  Lab 02/24/21 1107 02/25/21 0300 02/26/21 0121 02/27/21 0106 02/28/21 0119  CKTOTAL 3,898* 1,718* 943* 642* 388*   BNP: BNP (last 3 results) No results for input(s): BNP in the last 8760 hours.  ProBNP (last 3 results) No results for input(s): PROBNP in the last 8760 hours.  CBG: No results for input(s): GLUCAP in the last 168 hours.     Signed:  Kayleen Memos, MD [facility] 03/01/2021, 6:05 PM

## 2021-03-01 ENCOUNTER — Non-Acute Institutional Stay (SKILLED_NURSING_FACILITY): Payer: Medicare Other | Admitting: Adult Health

## 2021-03-01 ENCOUNTER — Encounter: Payer: Self-pay | Admitting: Adult Health

## 2021-03-01 DIAGNOSIS — R778 Other specified abnormalities of plasma proteins: Secondary | ICD-10-CM | POA: Diagnosis not present

## 2021-03-01 DIAGNOSIS — I10 Essential (primary) hypertension: Secondary | ICD-10-CM | POA: Diagnosis not present

## 2021-03-01 DIAGNOSIS — R339 Retention of urine, unspecified: Secondary | ICD-10-CM | POA: Diagnosis not present

## 2021-03-01 DIAGNOSIS — R7989 Other specified abnormal findings of blood chemistry: Secondary | ICD-10-CM

## 2021-03-01 DIAGNOSIS — N390 Urinary tract infection, site not specified: Secondary | ICD-10-CM

## 2021-03-01 DIAGNOSIS — T796XXS Traumatic ischemia of muscle, sequela: Secondary | ICD-10-CM | POA: Diagnosis not present

## 2021-03-01 DIAGNOSIS — E876 Hypokalemia: Secondary | ICD-10-CM | POA: Diagnosis not present

## 2021-03-01 NOTE — Progress Notes (Signed)
Location:  [place] Room Number: 210 A Place of Service:  SNF (31) Provider:  Durenda Age, DNP, FNP-BC  Patient Care Team: [name], MD as PCP - General (Family Medicine) Werner Lean, MD as PCP - Cardiology (Cardiology) Werner Lean, MD as Consulting Physician (Cardiology)  Extended Emergency Contact Information Primary Emergency Contact: [name],[name] Address: [address]          [place] [address] [name] Phone: [phone] Mobile Phone: [phone] Relation: Relative  Code Status:   Full Code  Goals of care: Advanced Directive information Advanced Directives 02/24/2021  Does Patient Have a Medical Advance Directive? No  Would patient like information on creating a medical advance directive? Yes (ED - Information included in AVS)     Chief Complaint  Patient presents with   Hospitalization Follow-up    Follow up hospitalization     HPI:  Cathy Silva is a 87 y.o. female who was admitted to [facility] on 02/28/2021 post hospital admission 02/24/2021 to 02/28/2021.  She has a PMH of hyperlipidemia, hypertension, mitral valve prolapse  and basal cell carcinoma.  She had a mechanical fall at home.  She was unable to get up and remained on the floor for unknown duration.  She was admitted for rhabdomyolysis secondary to prolonged full body contact on the floor.  Labs showed elevated troponin, peaked at 2618 and down trended.  She was started on heparin drip and was seen by cardiology.  2D echo revealed LVEF 60 to 65%, the left ventricle no regional wall motion abnormalities.  Heparin drip was stopped per cardiology and recommendation and signed out.  It was recommended for her to follow-up with cardiology, Dr. Gasper Sells.  Hospitalization was complicated by acute urinary retention on 02/26/2021 with 784 cc of urinary retention, requiring Foley catheterization.  She was  started on Flomax 0.4 mg daily.  Urinalysis done on 02/25/2021 was positive for pyuria, urine culture positive for greater than 100,000 colonies of Pseudomonas aeruginosa.  Antibiotics were switched to p.o. Levaquin x7 days on 02/28/2021 and to continue x7 days per recommendation of urology, Dr. Matilde Sprang.  She is to follow-up with urology for a voiding trial.  She was seen in her room today.  She was unable to clearly recall what had happened to her when she fell down at home.  Speech therapist obtained BIMS score of 15/15.   Past Medical History:  Diagnosis Date   Allergies    BCC (basal cell carcinoma), arm 2008   Left    Elevated cholesterol    Estrogen deficiency    Fracture    in back   Hx of scarlet fever    Hypercholesteremia    Hyponatremia    Insomnia    Low sodium levels    Melanoma in situ ([place]) 2009   Mitral valve prolapse    Murmur    Psychosocial problem    Thrombocytosis    Past Surgical History:  Procedure Laterality Date   BALLOON DILATION N/A 05/11/2015   Procedure: BALLOON DILATION;  Surgeon: Carol Ada, MD;  Location: [facility] ENDOSCOPY;  Service: Endoscopy;  Laterality: N/A;   DILATION AND CURETTAGE OF UTERUS  05/1984   Prolif   DILATION AND CURETTAGE OF UTERUS  06/1986   benign   ESOPHAGOGASTRODUODENOSCOPY Left 05/11/2015   Procedure: ESOPHAGOGASTRODUODENOSCOPY (EGD);  Surgeon: Carol Ada, MD;  Location: [facility] ENDOSCOPY;  Service: Endoscopy;  Laterality: Left;   EYE SURGERY     bilat  cataract   TONSILLECTOMY     TOTAL ABDOMINAL HYSTERECTOMY W/ BILATERAL SALPINGOOPHORECTOMY  05/1987   Leiomyoma    Allergies  Allergen Reactions   Novocain [Procaine] Shortness Of Breath and Other (See Comments)    "Deathly allergic to this"   Codeine Other (See Comments)    Reaction not recalled by the patient- stated it was "a long time ago"   Pneumococcal Vaccines Other (See Comments)    Reaction not recalled by the patient- stated it was "a long time ago"    Pseudoephedrine Other (See Comments)    Reaction not recalled by the patient- stated it was "a long time ago"   Tetanus Toxoids Swelling and Other (See Comments)    Swelling at injecton site   Tetanus-Diphtheria Toxoids Td Swelling and Other (See Comments)    Swelling where injected    Outpatient Encounter Medications as of 03/01/2021  Medication Sig   amLODipine (NORVASC) 5 MG tablet Take 1 tablet (5 mg total) by mouth daily.   Ascorbic Acid (VITAMIN C) 1000 MG tablet Take 1,000 mg by mouth daily.   aspirin EC 81 MG EC tablet Take 1 tablet (81 mg total) by mouth daily. Swallow whole.   Calcium Carbonate-Vitamin D (CALCIUM + D PO) Take 1,200 mg by mouth.   carvedilol (COREG) 6.25 MG tablet Take 1 tablet (6.25 mg total) by mouth 2 (two) times daily with a meal.   feeding supplement (BOOST / RESOURCE BREEZE) LIQD Take 1 Container by mouth 3 (three) times daily between meals. (Patient taking differently: Take 1 Container by mouth daily.)   levofloxacin (LEVAQUIN) 250 MG tablet Take 1 tablet (250 mg total) by mouth daily for 7 days.   Multiple Vitamins-Minerals (MULTIVITAMIN PO) Take 1 tablet by mouth daily with breakfast.   Omega-3 Fatty Acids (FISH OIL) 600 MG CAPS Take 600 mg by mouth in the morning and at bedtime.   tamsulosin (FLOMAX) 0.4 MG CAPS capsule Take 1 capsule (0.4 mg total) by mouth daily.   No facility-administered encounter medications on file as of 03/01/2021.    Review of Systems  GENERAL: No change in appetite, no fatigue, no weight changes, no fever or chills  MOUTH and THROAT: Denies oral discomfort, gingival pain or bleeding RESPIRATORY: no cough, SOB, DOE, wheezing, hemoptysis CARDIAC: No chest pain, edema or palpitations GI: No abdominal pain, diarrhea, constipation, heart burn, nausea or vomiting GU: + urinary retention NEUROLOGICAL: Denies dizziness, syncope, numbness, or headache PSYCHIATRIC: Denies feelings of depression or anxiety. No report of hallucinations,  insomnia, paranoia, or agitation   Immunization History  Administered Date(s) Administered   Influenza, High Dose Seasonal PF 01/19/2017   Pertinent  Health Maintenance Due  Topic Date Due   INFLUENZA VACCINE  11/27/2020   DEXA SCAN  Completed   Fall Risk 02/25/2021 02/26/2021 02/26/2021 02/27/2021 02/27/2021  Patient Fall Risk Level High fall risk High fall risk High fall risk High fall risk High fall risk     Vitals:   03/01/21 1157  BP: 135/69  Pulse: 87  Resp: 20  Temp: (!) 97.5 F (36.4 C)  Weight: 151 lb 9.6 oz (68.8 kg)  Height: 5\' 6"  (1.676 m)   Body mass index is 24.47 kg/m.  Physical Exam  GENERAL APPEARANCE: Well nourished. In no acute distress. Normal body habitus SKIN:  Bruises noted on bilateral arms (sustained from fall at home) MOUTH and THROAT: Lips are without lesions. Oral mucosa is moist and without lesions.  RESPIRATORY: Breathing is even & unlabored,  BS CTAB CARDIAC: RRR, +murmur,no extra heart sounds, no edema GI: Abdomen soft, normal BS, no masses, no tenderness GU:  +foley catheter, Fr 16 NEUROLOGICAL: There is no tremor. Speech is clear. Alert and oriented X 3. PSYCHIATRIC:  Affect and behavior are appropriate  Labs reviewed: Recent Labs    02/25/21 0300 02/26/21 0121 02/27/21 0106 02/28/21 0119  NA 134* 135 133* 131*  K 3.7 3.7 3.6 3.3*  CL 103 107 108 104  CO2 20* 22 19* 22  GLUCOSE 98 106* 98 102*  BUN 26* 23 19 11   CREATININE 0.79 0.62 0.59 0.63  CALCIUM 8.5* 8.0* 7.7* 7.8*  MG 2.1  --   --   --   PHOS 3.2  --   --   --    Recent Labs    02/24/21 1107 02/25/21 0300  AST 164* 117*  ALT 61* 53*  ALKPHOS 54 42  BILITOT 1.4* 1.2  PROT 6.4* 5.0*  ALBUMIN 3.3* 2.5*   Recent Labs    02/24/21 1107 02/25/21 0300 02/26/21 0121 02/27/21 0106  WBC 21.3* 18.1* 13.0* 10.3  NEUTROABS 19.0*  --   --   --   HGB 14.8 12.1 10.6* 10.1*  HCT 44.8 36.7 32.0* 30.8*  MCV 83.7 83.6 84.2 85.1  PLT 432* 346 309 305   Lab Results   Component Value Date   TSH 4.823 (H) 05/09/2015    Significant Diagnostic Results in last 30 days:  DG Elbow Complete Left  Result Date: 02/24/2021 CLINICAL DATA:  Status post fall, elbow pain EXAM: LEFT ELBOW - COMPLETE 3+ VIEW COMPARISON:  None. FINDINGS: Subtle lucency involving the superior corner of the olecranon most concerning for a nondisplaced fracture. No other fracture or dislocation. No aggressive osseous lesion. Normal alignment. Soft tissue are unremarkable. No radiopaque foreign body or soft tissue emphysema. IMPRESSION: 1. Subtle lucency involving the superior corner of the olecranon most concerning for a nondisplaced fracture. Electronically Signed   By: Kathreen Devoid M.D.   On: 02/24/2021 12:44   CT Head Wo Contrast  Result Date: 02/24/2021 CLINICAL DATA:  87 year old female with acute fall and head injury. Initial encounter. EXAM: CT HEAD WITHOUT CONTRAST TECHNIQUE: Contiguous axial images were obtained from the base of the skull through the vertex without intravenous contrast. COMPARISON:  None. FINDINGS: Brain: Obscuration of a portion of the RIGHT insular cortex is noted (series 3: Image 15-16) compatible with an infarct of uncertain chronicity, but appears more remote. No evidence of hemorrhage, hydrocephalus, extra-axial collection or mass lesion/mass effect. Atrophy identified. Mild periventricular white matter hypodensities likely represent chronic small vessel white matter ischemic changes. Vascular: Carotid and vertebral atherosclerotic calcifications are noted. Skull: No acute abnormality. Sinuses/Orbits: No acute abnormality Other: None IMPRESSION: 1. 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 Correlate clinically. No evidence of hemorrhage. 2. Atrophy and probable chronic small vessel white matter ischemic changes. Electronically Signed   By: Margarette Canada M.D.   On: 02/24/2021 12:01   US RENAL  Result Date: 02/27/2021 CLINICAL DATA:   Urinary retention EXAM: RENAL / URINARY TRACT ULTRASOUND COMPLETE COMPARISON:  None. FINDINGS: Right Kidney: Renal measurements: 9.7 x 4.7 x 5.2 cm = volume: 124 mL. Echogenicity within normal limits. No mass or hydronephrosis visualized. Left Kidney: Renal measurements: 10.9 x 4.5 x 4.6 cm = volume: 116 mL. Echogenicity within normal limits. There is a 1.1 x 0.8 x 0.9 cm simple cyst within the left kidney. No solid mass or hydronephrosis visualized.  Urinary bladder: Appears normal for degree of bladder distention. Foley catheter noted within the urinary bladder lumen. Other: None. IMPRESSION: Unremarkable renal ultrasound. Electronically Signed   By: Iven Finn M.D.   On: 02/27/2021 15:03   DG Shoulder Left  Result Date: 02/24/2021 CLINICAL DATA:  Status post fall, shoulder pain EXAM: LEFT SHOULDER - 2+ VIEW COMPARISON:  None. FINDINGS: No acute fracture or dislocation. No aggressive osseous lesion. Normal alignment. Generalized osteopenia. Mild osteoarthritis of the glenohumeral joint and acromioclavicular joint. Soft tissue are unremarkable. No radiopaque foreign body or soft tissue emphysema. IMPRESSION: No acute osseous injury of the left shoulder. Electronically Signed   By: Kathreen Devoid M.D.   On: 02/24/2021 12:45   ECHOCARDIOGRAM COMPLETE  Result Date: 02/25/2021    ECHOCARDIOGRAM REPORT   Patient Name:   Cathy Silva Mccard Date of Exam: 02/25/2021 Medical Rec #:  [id number]          Height:       66.0 in Accession #:    [id number]         Weight:       140.0 lb Date of Birth:  [birth year]          BSA:          1.719 m Patient Age:    72 years           BP:           173/67 mmHg Patient Gender: F                  HR:           80 bpm. Exam Location:  Inpatient Procedure: 2D Echo, Color Doppler and Cardiac Doppler Indications:    R07.9* Chest pain, unspecified  History:        Patient has prior history of Echocardiogram examinations, most                 recent 12/25/2020. Aortic Valve Disease;  Risk                 Factors:Hypertension and Dyslipidemia.  Sonographer:    Glo Herring Referring Phys: [place]  1. Left ventricular ejection fraction, by estimation, is 60 to 65%. The left ventricle has normal function. The left ventricle has no regional wall motion abnormalities. There is severe concentric left ventricular hypertrophy. Indeterminate diastolic filling due to E-A fusion.  2. Right ventricular systolic function is normal. The right ventricular size is normal. Tricuspid regurgitation signal is inadequate for assessing PA pressure.  3. The mitral valve is degenerative. No evidence of mitral valve regurgitation. No evidence of mitral stenosis.  4. The aortic valve is calcified. There is severe calcifcation of the aortic valve. There is severe thickening of the aortic valve. Aortic valve regurgitation is not visualized. Moderate aortic valve stenosis. Aortic valve mean gradient measures 21.0 mmHg. Aortic valve Vmax measures 3.18 m/s.  5. The inferior vena cava is normal in size with <50% respiratory variability, suggesting right atrial pressure of 8 mmHg. FINDINGS  Left Ventricle: Left ventricular ejection fraction, by estimation, is 60 to 65%. The left ventricle has normal function. The left ventricle has no regional wall motion abnormalities. The left ventricular internal cavity size was normal in size. There is  severe concentric left ventricular hypertrophy. Indeterminate diastolic filling due to E-A fusion. Right Ventricle: The right ventricular size is normal. No increase in right ventricular wall thickness. Right ventricular systolic function is normal. Tricuspid regurgitation  signal is inadequate for assessing PA pressure. Left Atrium: Left atrial size was normal in size. Right Atrium: Right atrial size was normal in size. Pericardium: There is no evidence of pericardial effusion. Mitral Valve: The mitral valve is degenerative in appearance. There is mild thickening  of the mitral valve leaflet(s). There is moderate calcification of the anterior mitral valve leaflet(s). Mild to moderate mitral annular calcification. No evidence of mitral valve regurgitation. No evidence of mitral valve stenosis. Tricuspid Valve: The tricuspid valve is normal in structure. Tricuspid valve regurgitation is trivial. No evidence of tricuspid stenosis. Aortic Valve: The aortic valve is calcified. There is severe calcifcation of the aortic valve. There is severe thickening of the aortic valve. Aortic valve regurgitation is not visualized. Moderate aortic stenosis is present. Aortic valve mean gradient measures 21.0 mmHg. Aortic valve peak gradient measures 40.4 mmHg. Pulmonic Valve: The pulmonic valve was normal in structure. Pulmonic valve regurgitation is trivial. No evidence of pulmonic stenosis. Aorta: The aortic root is normal in size and structure. Venous: The inferior vena cava is normal in size with less than 50% respiratory variability, suggesting right atrial pressure of 8 mmHg. IAS/Shunts: No atrial level shunt detected by color flow Doppler.  LEFT VENTRICLE PLAX 2D LVIDd:         2.90 cm LVIDs:         2.20 cm LV PW:         1.50 cm LV IVS:        1.50 cm  IVC IVC diam: 1.30 cm LEFT ATRIUM             Index LA diam:        3.20 cm 1.86 cm/m LA Vol (A2C):   35.3 ml 20.54 ml/m LA Vol (A4C):   40.0 ml 23.28 ml/m LA Biplane Vol: 38.6 ml 22.46 ml/m  AORTIC VALVE AV Vmax:           318.00 cm/s AV Vmean:          218.000 cm/s AV VTI:            0.538 m AV Peak Grad:      40.4 mmHg AV Mean Grad:      21.0 mmHg LVOT Vmax:         80.68 cm/s LVOT Vmean:        56.260 cm/s LVOT VTI:          0.158 m LVOT/AV VTI ratio: 0.29  AORTA Ao Root diam: 3.40 cm Ao Asc diam:  2.70 cm  SHUNTS Systemic VTI: 0.16 m Fransico Him MD Electronically signed by Fransico Him MD Signature Date/Time: 02/25/2021/1:57:48 PM    Final    DG Hip Unilat With Pelvis 2-3 Views Left  Result Date: 02/24/2021 CLINICAL DATA:   Status post fall, bilateral hip pain EXAM: DG HIP (WITH OR WITHOUT PELVIS) 2-3V RIGHT; LEFT FEMUR 2 VIEWS; RIGHT FEMUR 2 VIEWS; DG HIP (WITH OR WITHOUT PELVIS) 2-3V LEFT COMPARISON:  None. FINDINGS: No acute fracture or dislocation. No aggressive osseous lesion. Normal alignment. Generalized osteopenia. Mild osteoarthritis of the hips bilaterally. Moderate left medial femorotibial compartment osteoarthritis. Mild osteoarthritis of the right medial and lateral femorotibial compartments as well as the left lateral femorotibial compartment. Soft tissue are unremarkable. No radiopaque foreign body or soft tissue emphysema. Peripheral vascular atherosclerotic disease. IMPRESSION: 1.  No acute osseous injury of the right hip. 2.  No acute osseous injury of the left hip. 3.  No acute osseous injury of the  right femur. 4.  No acute osseous injury of the left femur. 5. Given the patient's age and osteopenia, if there is persistent clinical concern for an occult hip fracture, a MRI of the hip is recommended for increased sensitivity. Electronically Signed   By: Kathreen Devoid M.D.   On: 02/24/2021 12:52   DG Hip Unilat With Pelvis 2-3 Views Right  Result Date: 02/24/2021 CLINICAL DATA:  Status post fall, bilateral hip pain EXAM: DG HIP (WITH OR WITHOUT PELVIS) 2-3V RIGHT; LEFT FEMUR 2 VIEWS; RIGHT FEMUR 2 VIEWS; DG HIP (WITH OR WITHOUT PELVIS) 2-3V LEFT COMPARISON:  None. FINDINGS: No acute fracture or dislocation. No aggressive osseous lesion. Normal alignment. Generalized osteopenia. Mild osteoarthritis of the hips bilaterally. Moderate left medial femorotibial compartment osteoarthritis. Mild osteoarthritis of the right medial and lateral femorotibial compartments as well as the left lateral femorotibial compartment. Soft tissue are unremarkable. No radiopaque foreign body or soft tissue emphysema. Peripheral vascular atherosclerotic disease. IMPRESSION: 1.  No acute osseous injury of the right hip. 2.  No acute osseous  injury of the left hip. 3.  No acute osseous injury of the right femur. 4.  No acute osseous injury of the left femur. 5. Given the patient's age and osteopenia, if there is persistent clinical concern for an occult hip fracture, a MRI of the hip is recommended for increased sensitivity. Electronically Signed   By: Kathreen Devoid M.D.   On: 02/24/2021 12:52   DG FEMUR MIN 2 VIEWS LEFT  Result Date: 02/24/2021 CLINICAL DATA:  Status post fall, bilateral hip pain EXAM: DG HIP (WITH OR WITHOUT PELVIS) 2-3V RIGHT; LEFT FEMUR 2 VIEWS; RIGHT FEMUR 2 VIEWS; DG HIP (WITH OR WITHOUT PELVIS) 2-3V LEFT COMPARISON:  None. FINDINGS: No acute fracture or dislocation. No aggressive osseous lesion. Normal alignment. Generalized osteopenia. Mild osteoarthritis of the hips bilaterally. Moderate left medial femorotibial compartment osteoarthritis. Mild osteoarthritis of the right medial and lateral femorotibial compartments as well as the left lateral femorotibial compartment. Soft tissue are unremarkable. No radiopaque foreign body or soft tissue emphysema. Peripheral vascular atherosclerotic disease. IMPRESSION: 1.  No acute osseous injury of the right hip. 2.  No acute osseous injury of the left hip. 3.  No acute osseous injury of the right femur. 4.  No acute osseous injury of the left femur. 5. Given the patient's age and osteopenia, if there is persistent clinical concern for an occult hip fracture, a MRI of the hip is recommended for increased sensitivity. Electronically Signed   By: Kathreen Devoid M.D.   On: 02/24/2021 12:52   DG FEMUR, MIN 2 VIEWS RIGHT  Result Date: 02/24/2021 CLINICAL DATA:  Status post fall, bilateral hip pain EXAM: DG HIP (WITH OR WITHOUT PELVIS) 2-3V RIGHT; LEFT FEMUR 2 VIEWS; RIGHT FEMUR 2 VIEWS; DG HIP (WITH OR WITHOUT PELVIS) 2-3V LEFT COMPARISON:  None. FINDINGS: No acute fracture or dislocation. No aggressive osseous lesion. Normal alignment. Generalized osteopenia. Mild osteoarthritis of the  hips bilaterally. Moderate left medial femorotibial compartment osteoarthritis. Mild osteoarthritis of the right medial and lateral femorotibial compartments as well as the left lateral femorotibial compartment. Soft tissue are unremarkable. No radiopaque foreign body or soft tissue emphysema. Peripheral vascular atherosclerotic disease. IMPRESSION: 1.  No acute osseous injury of the right hip. 2.  No acute osseous injury of the left hip. 3.  No acute osseous injury of the right femur. 4.  No acute osseous injury of the left femur. 5. Given the patient's age and osteopenia, if  there is persistent clinical concern for an occult hip fracture, a MRI of the hip is recommended for increased sensitivity. Electronically Signed   By: Kathreen Devoid M.D.   On: 02/24/2021 12:52    Assessment/Plan  1. Traumatic rhabdomyolysis, sequela -  S/P fall at home, CT head was unremarkable -    CK trending down -    Was given IV fluid for hydration  2. Hypokalemia Lab Results  Component Value Date   K 3.3 (L) 02/28/2021   -  repleted  3. Essential hypertension -  BPs stable, continue amlodipine and Coreg  4. Urinary tract infection without hematuria, site unspecified -Received 4 days of Rocephin, and then switched to oral Levaquin  250 mg daily x7 days  5. Urinary retention -   Has Foley cath -   Follow-up with urology for voiding trial  6.  Elevated troponin -Troponin 2258, 2618, 1597, trending down -    EKG showed RBBB, no ST-T wave changes -    will follow up with cardiology on 03/21/2021    Family/ staff Communication: Discussed plan of care with resident and charge nurse.  Labs/tests ordered:  None  Goals of care:   Short-term care   Durenda Age, DNP, MSN, FNP-BC [facility] [phone] (Monday-Friday 8:00 a.m. - 5:00 p.m.) [phone] (after hours)

## 2021-03-02 ENCOUNTER — Encounter: Payer: Self-pay | Admitting: Internal Medicine

## 2021-03-02 ENCOUNTER — Non-Acute Institutional Stay (SKILLED_NURSING_FACILITY): Payer: Medicare Other | Admitting: Internal Medicine

## 2021-03-02 DIAGNOSIS — T796XXD Traumatic ischemia of muscle, subsequent encounter: Secondary | ICD-10-CM

## 2021-03-02 DIAGNOSIS — D649 Anemia, unspecified: Secondary | ICD-10-CM | POA: Insufficient documentation

## 2021-03-02 DIAGNOSIS — R7989 Other specified abnormal findings of blood chemistry: Secondary | ICD-10-CM

## 2021-03-02 DIAGNOSIS — N390 Urinary tract infection, site not specified: Secondary | ICD-10-CM | POA: Insufficient documentation

## 2021-03-02 DIAGNOSIS — R338 Other retention of urine: Secondary | ICD-10-CM | POA: Diagnosis not present

## 2021-03-02 DIAGNOSIS — D72829 Elevated white blood cell count, unspecified: Secondary | ICD-10-CM | POA: Insufficient documentation

## 2021-03-02 DIAGNOSIS — R778 Other specified abnormalities of plasma proteins: Secondary | ICD-10-CM | POA: Insufficient documentation

## 2021-03-02 DIAGNOSIS — I10 Essential (primary) hypertension: Secondary | ICD-10-CM | POA: Diagnosis not present

## 2021-03-02 DIAGNOSIS — D72825 Bandemia: Secondary | ICD-10-CM | POA: Diagnosis not present

## 2021-03-02 NOTE — Assessment & Plan Note (Signed)
No GU symptoms at this time. 

## 2021-03-02 NOTE — Assessment & Plan Note (Signed)
Cardiology follow-up 11/23

## 2021-03-02 NOTE — Assessment & Plan Note (Addendum)
PTA for rhabdomyolysis she was on low-dose amlodipine.  Amlodipine increased to 5 mg daily and carvedilol 6.25 mg twice daily initiated. Blood pressure at discharge was 182/68. Today's blood pressure is soft with a systolic of 938.  She has 1+ generalized edema of the lower extremities.  Amlodipine will be held and blood pressure monitored.  Carvedilol dose will be titrated based on those readings.

## 2021-03-02 NOTE — Assessment & Plan Note (Signed)
Because of history of fall and urinary retention; Foley will be maintained until the urologic follow-up.

## 2021-03-02 NOTE — Patient Instructions (Signed)
See assessment and plan under each diagnosis in the problem list and acutely for this visit 

## 2021-03-02 NOTE — Assessment & Plan Note (Addendum)
She has not complained of any significant muscle pain.  Strength to opposition is fair to good.

## 2021-03-02 NOTE — Assessment & Plan Note (Signed)
No jaundice or scleral icterus on exam.  LFTs will not be repeated unless there new clinical indication.

## 2021-03-02 NOTE — Progress Notes (Signed)
NURSING HOME LOCATION:  [facility] ROOM NUMBER:  [id number]  CODE STATUS:  Full Code  PCP: Dibas Koirala MD  This is a comprehensive admission note to this SNFperformed on this date less than 30 days from date of admission. Included are preadmission medical/surgical history; reconciled medication list; family history; social history and comprehensive review of systems.  Corrections and additions to the records were documented. Comprehensive physical exam was also performed. Additionally a clinical summary was entered for each active diagnosis pertinent to this admission in the Problem List to enhance continuity of care.  HPI: Patient was hospitalized 10/29 - 02/28/2021 after an apparent mechanical fall (albeit exact nature of the fall has not been defined) with associated rhabdomyolysis.  CK peaked at 3898 but trended down to a value of 388 prior to discharge.  She also had elevated troponin with a peak value of 2618; this trended down to 1597 prior to discharge.  The latter was in the context of moderate aortic stenosis, poorly controlled hypertension, and severe LVH.  No ACS was felt to be present as per Cardiology consultant.  Heparin was discontinued and aspirin prophylaxis initiated. Hypertension was treated with increase in amlodipine to 5 mg daily and with addition of carvedilol 6.25 mg twice daily.  Cardiology outpatient follow-up will be with Dr. Miguel Rota. Course was complicated by acute urinary retention for which Foley catheter was inserted.  Urinary residual was 784 cc.  Flomax was initiated.  Urine culture did reveal Pseudomonas aeruginosa for which she received 4 days of Rocephin with transition to Levaquin 250 mg daily for total of 7 days.  Urologist Dr. Arther Dames is to perform voiding trial as an outpatient. Liver enzymes were elevated with a peak AST of 164 and ALT of 61.  Subsequent values were 117 and 53. She was found to be clinically stable for discharge  to SNF for rehab with PT/OT.  Past medical and surgical history: Includes history of upper extremity basal cell carcinoma, dyslipidemia, history of hyponatremia, history of melanoma in situ, and history of  MVP, and thrombocytosis. Procedures and surgeries include balloon dilation, D&C x2, and TAH.  Social history: No history of smoking or alcohol intake.  Family history: Family history is noncontributory due to advanced age.  It is interesting that her father apparently had tuberculosis.  Her mother had CAD and maternal grandmother ACS.   Review of systems: She denies any memory of the fall.  She states [place] and had [place] the items she purchased when she fell onto the living room rug without any prodrome.  She believes she had been on the ground 5 days but apparently her neighbor found her within 48 hours.  She apparently had crawled from the living room to the bedroom.  Since the fall she has noted progressive BLE swelling with some numbness and tingling in her feet. She attributes the edema to being sedentary with her feet dependent.  She denies any chronic cardiopulmonary symptoms. She does describe occasional dysphagia when she has to take multiple pills at 1 time.  Constitutional: No fever, significant weight change ENT/mouth: No nasal congestion, purulent discharge, earache, change in hearing, sore throat  Cardiovascular: No chest pain, palpitations, paroxysmal nocturnal dyspnea, claudication  Respiratory: No cough, sputum production, hemoptysis, DOE, significant snoring, apnea  Gastrointestinal: No heartburn, dysphagia, abdominal pain, nausea /vomiting, rectal bleeding, melena, change in bowels Genitourinary: No dysuria, hematuria, pyuria Musculoskeletal: No joint stiffness, joint swelling, pain Dermatologic: No rash, pruritus,  change in appearance of skin Neurologic: No dizziness, headache, syncope, seizures Psychiatric: No significant anxiety, depression,  insomnia, anorexia Endocrine: No change in hair/skin/nails, excessive thirst, excessive hunger, excessive urination  Hematologic/lymphatic: No significant lymphadenopathy, abnormal bleeding Allergy/immunology: No itchy/watery eyes, significant sneezing, urticaria, angioedema  Physical exam:  Pertinent or positive findings: She appears her stated age.  She is slightly hard of hearing.  Eyebrows are absent.  Dental hygiene is very good.  She has a grade 1 systolic murmur loudest at the right base with asked extension into the right carotid.  Breath sounds are decreased slightly.  She has 1+ edema of the lower extremities below the knees.  Pedal pulses are decreased.  The legs are cool to touch.  She has bruising over the upper extremities especially the dorsum of the left hand.  Strength to opposition is fair-good.  General appearance: Adequately nourished; no acute distress, increased work of breathing is present.   Lymphatic: No lymphadenopathy about the head, neck, axilla. Eyes: No conjunctival inflammation or lid edema is present. There is no scleral icterus. Ears:  External ear exam shows no significant lesions or deformities.   Nose:  External nasal examination shows no deformity or inflammation. Nasal mucosa are pink and moist without lesions, exudates Oral exam: Lips and gums are healthy appearing.There is no oropharyngeal erythema or exudate. Neck:  No thyromegaly, masses, tenderness noted.    Heart:  Normal rate and regular rhythm. S1 and S2 normal without gallop, click, rub.  Lungs: without wheezes, rhonchi, rales, rubs. Abdomen: Bowel sounds are normal.  Abdomen is soft and nontender with no organomegaly, hernias, masses. GU: Deferred  Extremities:  No cyanosis, clubbing. Neurologic exam:  Strength equal  in upper & lower extremities. Balance, Rhomberg, finger to nose testing could not be completed due to clinical state Skin: Warm & dry w/o tenting. No significant  rash.  See  clinical summary under each active problem in the Problem List with associated updated therapeutic plan

## 2021-03-02 NOTE — Assessment & Plan Note (Signed)
Afebrile with no infectious symptoms.

## 2021-03-02 NOTE — Assessment & Plan Note (Signed)
She does have significant bruising over the upper extremities in the context of history of fall.  She has no other bleeding dyscrasias.

## 2021-03-04 ENCOUNTER — Encounter: Payer: Self-pay | Admitting: Adult Health

## 2021-03-05 ENCOUNTER — Emergency Department (HOSPITAL_COMMUNITY): Payer: Medicare Other

## 2021-03-05 ENCOUNTER — Inpatient Hospital Stay (HOSPITAL_COMMUNITY)
Admission: EM | Admit: 2021-03-05 | Discharge: 2021-03-13 | DRG: 064 | Disposition: A | Discharge status: Skilled Nursing Facility | Payer: Medicare Other | Source: Skilled Nursing Facility | Attending: Internal Medicine | Admitting: Internal Medicine

## 2021-03-05 ENCOUNTER — Encounter: Payer: Self-pay | Admitting: Adult Health

## 2021-03-05 ENCOUNTER — Encounter (HOSPITAL_COMMUNITY): Payer: Self-pay | Admitting: Family Medicine

## 2021-03-05 ENCOUNTER — Non-Acute Institutional Stay (SKILLED_NURSING_FACILITY): Payer: Medicare Other | Admitting: Adult Health

## 2021-03-05 ENCOUNTER — Other Ambulatory Visit: Payer: Self-pay

## 2021-03-05 DIAGNOSIS — E871 Hypo-osmolality and hyponatremia: Secondary | ICD-10-CM | POA: Diagnosis present

## 2021-03-05 DIAGNOSIS — R279 Unspecified lack of coordination: Secondary | ICD-10-CM | POA: Diagnosis not present

## 2021-03-05 DIAGNOSIS — E222 Syndrome of inappropriate secretion of antidiuretic hormone: Secondary | ICD-10-CM | POA: Diagnosis not present

## 2021-03-05 DIAGNOSIS — E7800 Pure hypercholesterolemia, unspecified: Secondary | ICD-10-CM | POA: Diagnosis present

## 2021-03-05 DIAGNOSIS — Z66 Do not resuscitate: Secondary | ICD-10-CM | POA: Diagnosis present

## 2021-03-05 DIAGNOSIS — N139 Obstructive and reflux uropathy, unspecified: Secondary | ICD-10-CM | POA: Diagnosis present

## 2021-03-05 DIAGNOSIS — R531 Weakness: Secondary | ICD-10-CM | POA: Diagnosis not present

## 2021-03-05 DIAGNOSIS — I6782 Cerebral ischemia: Secondary | ICD-10-CM | POA: Diagnosis not present

## 2021-03-05 DIAGNOSIS — R4781 Slurred speech: Secondary | ICD-10-CM | POA: Diagnosis present

## 2021-03-05 DIAGNOSIS — I69920 Aphasia following unspecified cerebrovascular disease: Secondary | ICD-10-CM | POA: Diagnosis not present

## 2021-03-05 DIAGNOSIS — G8191 Hemiplegia, unspecified affecting right dominant side: Secondary | ICD-10-CM | POA: Diagnosis present

## 2021-03-05 DIAGNOSIS — I10 Essential (primary) hypertension: Secondary | ICD-10-CM | POA: Diagnosis present

## 2021-03-05 DIAGNOSIS — R471 Dysarthria and anarthria: Secondary | ICD-10-CM | POA: Diagnosis present

## 2021-03-05 DIAGNOSIS — R4701 Aphasia: Secondary | ICD-10-CM | POA: Diagnosis present

## 2021-03-05 DIAGNOSIS — I63412 Cerebral infarction due to embolism of left middle cerebral artery: Secondary | ICD-10-CM | POA: Diagnosis not present

## 2021-03-05 DIAGNOSIS — R262 Difficulty in walking, not elsewhere classified: Secondary | ICD-10-CM | POA: Diagnosis not present

## 2021-03-05 DIAGNOSIS — I69318 Other symptoms and signs involving cognitive functions following cerebral infarction: Secondary | ICD-10-CM | POA: Diagnosis not present

## 2021-03-05 DIAGNOSIS — I69351 Hemiplegia and hemiparesis following cerebral infarction affecting right dominant side: Secondary | ICD-10-CM | POA: Diagnosis not present

## 2021-03-05 DIAGNOSIS — D649 Anemia, unspecified: Secondary | ICD-10-CM | POA: Diagnosis present

## 2021-03-05 DIAGNOSIS — R29707 NIHSS score 7: Secondary | ICD-10-CM | POA: Diagnosis present

## 2021-03-05 DIAGNOSIS — I480 Paroxysmal atrial fibrillation: Secondary | ICD-10-CM | POA: Diagnosis present

## 2021-03-05 DIAGNOSIS — G9341 Metabolic encephalopathy: Secondary | ICD-10-CM | POA: Diagnosis present

## 2021-03-05 DIAGNOSIS — G319 Degenerative disease of nervous system, unspecified: Secondary | ICD-10-CM | POA: Diagnosis not present

## 2021-03-05 DIAGNOSIS — Z888 Allergy status to other drugs, medicaments and biological substances status: Secondary | ICD-10-CM | POA: Diagnosis not present

## 2021-03-05 DIAGNOSIS — R2681 Unsteadiness on feet: Secondary | ICD-10-CM | POA: Diagnosis not present

## 2021-03-05 DIAGNOSIS — E46 Unspecified protein-calorie malnutrition: Secondary | ICD-10-CM | POA: Diagnosis not present

## 2021-03-05 DIAGNOSIS — G459 Transient cerebral ischemic attack, unspecified: Secondary | ICD-10-CM | POA: Diagnosis not present

## 2021-03-05 DIAGNOSIS — I350 Nonrheumatic aortic (valve) stenosis: Secondary | ICD-10-CM | POA: Diagnosis not present

## 2021-03-05 DIAGNOSIS — N390 Urinary tract infection, site not specified: Secondary | ICD-10-CM

## 2021-03-05 DIAGNOSIS — G9340 Encephalopathy, unspecified: Secondary | ICD-10-CM | POA: Diagnosis not present

## 2021-03-05 DIAGNOSIS — J9811 Atelectasis: Secondary | ICD-10-CM | POA: Diagnosis present

## 2021-03-05 DIAGNOSIS — I63413 Cerebral infarction due to embolism of bilateral middle cerebral arteries: Secondary | ICD-10-CM | POA: Diagnosis not present

## 2021-03-05 DIAGNOSIS — L03113 Cellulitis of right upper limb: Secondary | ICD-10-CM | POA: Diagnosis not present

## 2021-03-05 DIAGNOSIS — E785 Hyperlipidemia, unspecified: Secondary | ICD-10-CM | POA: Diagnosis present

## 2021-03-05 DIAGNOSIS — I6300 Cerebral infarction due to thrombosis of unspecified precerebral artery: Secondary | ICD-10-CM | POA: Diagnosis not present

## 2021-03-05 DIAGNOSIS — L89622 Pressure ulcer of left heel, stage 2: Secondary | ICD-10-CM | POA: Diagnosis not present

## 2021-03-05 DIAGNOSIS — M6282 Rhabdomyolysis: Secondary | ICD-10-CM | POA: Diagnosis not present

## 2021-03-05 DIAGNOSIS — I69320 Aphasia following cerebral infarction: Secondary | ICD-10-CM | POA: Diagnosis not present

## 2021-03-05 DIAGNOSIS — I63512 Cerebral infarction due to unspecified occlusion or stenosis of left middle cerebral artery: Secondary | ICD-10-CM | POA: Diagnosis present

## 2021-03-05 DIAGNOSIS — I4510 Unspecified right bundle-branch block: Secondary | ICD-10-CM | POA: Diagnosis not present

## 2021-03-05 DIAGNOSIS — R0902 Hypoxemia: Secondary | ICD-10-CM | POA: Diagnosis not present

## 2021-03-05 DIAGNOSIS — R29810 Facial weakness: Secondary | ICD-10-CM | POA: Diagnosis present

## 2021-03-05 DIAGNOSIS — I63233 Cerebral infarction due to unspecified occlusion or stenosis of bilateral carotid arteries: Secondary | ICD-10-CM | POA: Diagnosis not present

## 2021-03-05 DIAGNOSIS — Z8249 Family history of ischemic heart disease and other diseases of the circulatory system: Secondary | ICD-10-CM | POA: Diagnosis not present

## 2021-03-05 DIAGNOSIS — S71101D Unspecified open wound, right thigh, subsequent encounter: Secondary | ICD-10-CM | POA: Diagnosis not present

## 2021-03-05 DIAGNOSIS — I672 Cerebral atherosclerosis: Secondary | ICD-10-CM | POA: Diagnosis present

## 2021-03-05 DIAGNOSIS — Z9841 Cataract extraction status, right eye: Secondary | ICD-10-CM

## 2021-03-05 DIAGNOSIS — Z7982 Long term (current) use of aspirin: Secondary | ICD-10-CM

## 2021-03-05 DIAGNOSIS — Z20822 Contact with and (suspected) exposure to covid-19: Secondary | ICD-10-CM | POA: Diagnosis present

## 2021-03-05 DIAGNOSIS — E860 Dehydration: Secondary | ICD-10-CM | POA: Diagnosis present

## 2021-03-05 DIAGNOSIS — N319 Neuromuscular dysfunction of bladder, unspecified: Secondary | ICD-10-CM | POA: Diagnosis not present

## 2021-03-05 DIAGNOSIS — Z7189 Other specified counseling: Secondary | ICD-10-CM | POA: Diagnosis not present

## 2021-03-05 DIAGNOSIS — I69391 Dysphagia following cerebral infarction: Secondary | ICD-10-CM | POA: Diagnosis not present

## 2021-03-05 DIAGNOSIS — R0602 Shortness of breath: Secondary | ICD-10-CM | POA: Diagnosis not present

## 2021-03-05 DIAGNOSIS — Z79899 Other long term (current) drug therapy: Secondary | ICD-10-CM

## 2021-03-05 DIAGNOSIS — L89610 Pressure ulcer of right heel, unstageable: Secondary | ICD-10-CM | POA: Diagnosis not present

## 2021-03-05 DIAGNOSIS — R1312 Dysphagia, oropharyngeal phase: Secondary | ICD-10-CM | POA: Diagnosis not present

## 2021-03-05 DIAGNOSIS — D72829 Elevated white blood cell count, unspecified: Secondary | ICD-10-CM

## 2021-03-05 DIAGNOSIS — I639 Cerebral infarction, unspecified: Secondary | ICD-10-CM | POA: Diagnosis present

## 2021-03-05 DIAGNOSIS — R64 Cachexia: Secondary | ICD-10-CM | POA: Diagnosis present

## 2021-03-05 DIAGNOSIS — R338 Other retention of urine: Secondary | ICD-10-CM | POA: Diagnosis not present

## 2021-03-05 DIAGNOSIS — R54 Age-related physical debility: Secondary | ICD-10-CM | POA: Diagnosis present

## 2021-03-05 DIAGNOSIS — Z9181 History of falling: Secondary | ICD-10-CM | POA: Diagnosis not present

## 2021-03-05 DIAGNOSIS — I6523 Occlusion and stenosis of bilateral carotid arteries: Secondary | ICD-10-CM | POA: Diagnosis not present

## 2021-03-05 DIAGNOSIS — H9193 Unspecified hearing loss, bilateral: Secondary | ICD-10-CM | POA: Diagnosis not present

## 2021-03-05 DIAGNOSIS — I63513 Cerebral infarction due to unspecified occlusion or stenosis of bilateral middle cerebral arteries: Secondary | ICD-10-CM | POA: Diagnosis not present

## 2021-03-05 DIAGNOSIS — Z743 Need for continuous supervision: Secondary | ICD-10-CM | POA: Diagnosis not present

## 2021-03-05 DIAGNOSIS — G9389 Other specified disorders of brain: Secondary | ICD-10-CM | POA: Diagnosis not present

## 2021-03-05 DIAGNOSIS — Z515 Encounter for palliative care: Secondary | ICD-10-CM | POA: Diagnosis not present

## 2021-03-05 DIAGNOSIS — I63533 Cerebral infarction due to unspecified occlusion or stenosis of bilateral posterior cerebral arteries: Secondary | ICD-10-CM | POA: Diagnosis not present

## 2021-03-05 DIAGNOSIS — Z9842 Cataract extraction status, left eye: Secondary | ICD-10-CM

## 2021-03-05 DIAGNOSIS — M6281 Muscle weakness (generalized): Secondary | ICD-10-CM | POA: Diagnosis not present

## 2021-03-05 DIAGNOSIS — Z85820 Personal history of malignant melanoma of skin: Secondary | ICD-10-CM

## 2021-03-05 LAB — COMPREHENSIVE METABOLIC PANEL
ALT: 31 U/L (ref 0–44)
AST: 24 U/L (ref 15–41)
Albumin: 2.6 g/dL — ABNORMAL LOW (ref 3.5–5.0)
Alkaline Phosphatase: 47 U/L (ref 38–126)
Anion gap: 8 (ref 5–15)
BUN: 8 mg/dL (ref 8–23)
CO2: 23 mmol/L (ref 22–32)
Calcium: 8.2 mg/dL — ABNORMAL LOW (ref 8.9–10.3)
Chloride: 95 mmol/L — ABNORMAL LOW (ref 98–111)
Creatinine, Ser: 0.57 mg/dL (ref 0.44–1.00)
GFR, Estimated: >60 mL/min (ref >60)
Glucose, Bld: 119 mg/dL — ABNORMAL HIGH (ref 70–99)
Potassium: 3.5 mmol/L (ref 3.5–5.1)
Sodium: 126 mmol/L — ABNORMAL LOW (ref 135–145)
Total Bilirubin: 1.1 mg/dL (ref 0.3–1.2)
Total Protein: 5.4 g/dL — ABNORMAL LOW (ref 6.5–8.1)

## 2021-03-05 LAB — CBC
HCT: 33.1 % — ABNORMAL LOW (ref 36.0–46.0)
Hemoglobin: 11.4 g/dL — ABNORMAL LOW (ref 12.0–15.0)
MCH: 28.1 pg (ref 26.0–34.0)
MCHC: 34.4 g/dL (ref 30.0–36.0)
MCV: 81.7 fL (ref 80.0–100.0)
Platelets: 369 10*3/uL (ref 150–400)
RBC: 4.05 MIL/uL (ref 3.87–5.11)
RDW: 14.1 % (ref 11.5–15.5)
WBC: 9.5 10*3/uL (ref 4.0–10.5)
nRBC: 0 % (ref 0.0–0.2)

## 2021-03-05 LAB — RAPID URINE DRUG SCREEN, HOSP PERFORMED
Amphetamines: NOT DETECTED
Barbiturates: NOT DETECTED
Benzodiazepines: NOT DETECTED
Cocaine: NOT DETECTED
Opiates: NOT DETECTED
Tetrahydrocannabinol: NOT DETECTED

## 2021-03-05 LAB — I-STAT CHEM 8, ED
BUN: 7 mg/dL — ABNORMAL LOW (ref 8–23)
Calcium, Ion: 1.09 mmol/L — ABNORMAL LOW (ref 1.15–1.40)
Chloride: 94 mmol/L — ABNORMAL LOW (ref 98–111)
Creatinine, Ser: 0.5 mg/dL (ref 0.44–1.00)
Glucose, Bld: 121 mg/dL — ABNORMAL HIGH (ref 70–99)
HCT: 32 % — ABNORMAL LOW (ref 36.0–46.0)
Hemoglobin: 10.9 g/dL — ABNORMAL LOW (ref 12.0–15.0)
Potassium: 3.7 mmol/L (ref 3.5–5.1)
Sodium: 127 mmol/L — ABNORMAL LOW (ref 135–145)
TCO2: 26 mmol/L (ref 22–32)

## 2021-03-05 LAB — DIFFERENTIAL
Abs Immature Granulocytes: 0.07 10*3/uL (ref 0.00–0.07)
Basophils Absolute: 0.1 10*3/uL (ref 0.0–0.1)
Basophils Relative: 1 %
Eosinophils Absolute: 0.1 10*3/uL (ref 0.0–0.5)
Eosinophils Relative: 1 %
Immature Granulocytes: 1 %
Lymphocytes Relative: 13 %
Lymphs Abs: 1.3 10*3/uL (ref 0.7–4.0)
Monocytes Absolute: 1.4 10*3/uL — ABNORMAL HIGH (ref 0.1–1.0)
Monocytes Relative: 15 %
Neutro Abs: 6.7 10*3/uL (ref 1.7–7.7)
Neutrophils Relative %: 69 %

## 2021-03-05 LAB — RESP PANEL BY RT-PCR (FLU A&B, COVID) ARPGX2
Influenza A by PCR: NEGATIVE
Influenza B by PCR: NEGATIVE
SARS Coronavirus 2 by RT PCR: NEGATIVE

## 2021-03-05 LAB — URINALYSIS, ROUTINE W REFLEX MICROSCOPIC
Bilirubin Urine: NEGATIVE
Glucose, UA: NEGATIVE mg/dL
Hgb urine dipstick: NEGATIVE
Ketones, ur: 5 mg/dL — AB
Leukocytes,Ua: NEGATIVE
Nitrite: NEGATIVE
Protein, ur: NEGATIVE mg/dL
Specific Gravity, Urine: 1.009 (ref 1.005–1.030)
pH: 7 (ref 5.0–8.0)

## 2021-03-05 LAB — MAGNESIUM: Magnesium: 1.8 mg/dL (ref 1.7–2.4)

## 2021-03-05 LAB — APTT: aPTT: 30 seconds (ref 24–36)

## 2021-03-05 LAB — PROTIME-INR
INR: 1 (ref 0.8–1.2)
Prothrombin Time: 13.6 seconds (ref 11.4–15.2)

## 2021-03-05 LAB — TSH: TSH: 2.867 u[IU]/mL (ref 0.350–4.500)

## 2021-03-05 MED ORDER — SENNOSIDES-DOCUSATE SODIUM 8.6-50 MG PO TABS: 1.0000 | ORAL_TABLET | Freq: Every evening | ORAL | Status: DC | PRN

## 2021-03-05 MED ORDER — BOOST / RESOURCE BREEZE PO LIQD CUSTOM
1.0000 | Freq: Every day | ORAL | Status: DC
  Administered 2021-03-06 – 2021-03-09 (×4): 1 via ORAL
  Administered 2021-03-10: 237 mL via ORAL
  Administered 2021-03-11 – 2021-03-13 (×2): 1 via ORAL
  Filled 2021-03-05: qty 1

## 2021-03-05 MED ORDER — STROKE: EARLY STAGES OF RECOVERY BOOK: Freq: Once | Status: AC

## 2021-03-05 MED ORDER — ASPIRIN 300 MG RE SUPP
300.0000 mg | Freq: Every day | RECTAL | Status: DC
  Filled 2021-03-05: qty 1

## 2021-03-05 MED ORDER — LACTATED RINGERS IV SOLN: INTRAVENOUS | Status: DC

## 2021-03-05 MED ORDER — ACETAMINOPHEN 650 MG RE SUPP: 650.0000 mg | RECTAL | Status: DC | PRN

## 2021-03-05 MED ORDER — ACETAMINOPHEN 325 MG PO TABS: 650.0000 mg | ORAL_TABLET | ORAL | Status: DC | PRN

## 2021-03-05 MED ORDER — ADULT MULTIVITAMIN W/MINERALS CH
1.0000 | ORAL_TABLET | Freq: Every day | ORAL | Status: DC
  Administered 2021-03-06 – 2021-03-13 (×8): 1 via ORAL
  Filled 2021-03-05 (×8): qty 1

## 2021-03-05 MED ORDER — CARVEDILOL 3.125 MG PO TABS: 6.2500 mg | ORAL_TABLET | Freq: Two times a day (BID) | ORAL | Status: DC

## 2021-03-05 MED ORDER — ACETAMINOPHEN 160 MG/5ML PO SOLN: 650.0000 mg | ORAL | Status: DC | PRN

## 2021-03-05 MED ORDER — ASPIRIN 81 MG PO CHEW
81.0000 mg | CHEWABLE_TABLET | Freq: Every day | ORAL | Status: DC
  Administered 2021-03-05 – 2021-03-13 (×9): 81 mg via ORAL
  Filled 2021-03-05 (×9): qty 1

## 2021-03-05 MED ORDER — ASPIRIN 81 MG PO TBEC: 81.0000 mg | DELAYED_RELEASE_TABLET | Freq: Every day | ORAL | Status: DC

## 2021-03-05 MED ORDER — LACTATED RINGERS IV BOLUS
1000.0000 mL | Freq: Once | INTRAVENOUS | Status: AC
  Administered 2021-03-05: 1000 mL via INTRAVENOUS

## 2021-03-05 MED ORDER — AMLODIPINE BESYLATE 5 MG PO TABS: 5.0000 mg | ORAL_TABLET | Freq: Every day | ORAL | Status: DC

## 2021-03-05 NOTE — Progress Notes (Signed)
Location:  [name] Room Number: 210 A Place of Service:  SNF (31) Provider:  Durenda Age, DNP, FNP-BC  Patient Care Team: [name], MD as PCP - General (Family Medicine) Werner Lean, MD as PCP - Cardiology (Cardiology) Werner Lean, MD as Consulting Physician (Cardiology)  Extended Emergency Contact Information Primary Emergency Contact: [name],[name] Address: [address]          [place] [address] [name] of [place] Phone: [phone] Mobile Phone: [phone] Relation: Relative  Code Status: Full code  Goals of care: Advanced Directive information Advanced Directives 02/24/2021  Does Patient Have a Medical Advance Directive? No  Would patient like information on creating a medical advance directive? Yes (ED - Information included in AVS)     Chief Complaint  Patient presents with   Acute Visit    Change in level of alertness, Right-sided weakness, slurred speech    HPI:  Pt is a 87 y.o. female seen today for change in condition.  She was reported to be pocketing her food during dinner last night.  This morning at around 7 AM, she was noted to be slurring in her speech and unable to answer queries.  She is unable to identify a brush or a pen.  She is able to move her right hand but with notable weakness which was not present during her admission on 02/28/2021.  She was recently admitted to [facility] on 02/28/2021 post hospital admission 02/24/2021 to 02/28/2021.  She has a PMH of hyperlipidemia, hypertension, mitral valve prolapse and basal cell carcinoma.  She had a mechanical fall at home and was unable to get up so she remained on the floor for unknown duration.  CT head was unremarkable.  She was admitted for traumatic rhabdomyolysis.  She was admitted to [facility] for short-term term rehabilitation.   Past Medical History:  Diagnosis Date   Allergies    BCC (basal  cell carcinoma), arm 2008   Left    Elevated cholesterol    Estrogen deficiency    Fracture    in back   Hx of scarlet fever    Hypercholesteremia    Hyponatremia    Insomnia    Low sodium levels    Melanoma in situ ([place]) 2009   Mitral valve prolapse    Murmur    Psychosocial problem    Thrombocytosis    Past Surgical History:  Procedure Laterality Date   BALLOON DILATION N/A 05/11/2015   Procedure: BALLOON DILATION;  Surgeon: Carol Ada, MD;  Location: [facility] ENDOSCOPY;  Service: Endoscopy;  Laterality: N/A;   DILATION AND CURETTAGE OF UTERUS  05/1984   Prolif   DILATION AND CURETTAGE OF UTERUS  06/1986   benign   ESOPHAGOGASTRODUODENOSCOPY Left 05/11/2015   Procedure: ESOPHAGOGASTRODUODENOSCOPY (EGD);  Surgeon: Carol Ada, MD;  Location: [facility] ENDOSCOPY;  Service: Endoscopy;  Laterality: Left;   EYE SURGERY     bilat cataract   TONSILLECTOMY     TOTAL ABDOMINAL HYSTERECTOMY W/ BILATERAL SALPINGOOPHORECTOMY  05/1987   Leiomyoma    Allergies  Allergen Reactions   Novocain [Procaine] Shortness Of Breath and Other (See Comments)    "Deathly allergic to this"   Codeine Other (See Comments)    Reaction not recalled by the patient- stated it was "a long time ago"   Pneumococcal Vaccines Other (See Comments)    Reaction not recalled by the patient- stated it was "a long time ago"   Pseudoephedrine Other (See  Comments)    Reaction not recalled by the patient- stated it was "a long time ago"   Tetanus Toxoids Swelling and Other (See Comments)    Swelling at injecton site   Tetanus-Diphtheria Toxoids Td Swelling and Other (See Comments)    Swelling where injected    Outpatient Encounter Medications as of 03/05/2021  Medication Sig   amLODipine (NORVASC) 5 MG tablet Take 1 tablet (5 mg total) by mouth daily.   Ascorbic Acid (VITAMIN C) 1000 MG tablet Take 1,000 mg by mouth daily.   aspirin EC 81 MG EC tablet Take 1 tablet (81 mg total) by mouth daily. Swallow whole.   Calcium  Carbonate-Vitamin D (CALCIUM + D PO) Take 1,200 mg by mouth.   carvedilol (COREG) 6.25 MG tablet Take 1 tablet (6.25 mg total) by mouth 2 (two) times daily with a meal.   feeding supplement (BOOST / RESOURCE BREEZE) LIQD Take 1 Container by mouth 3 (three) times daily between meals. (Patient taking differently: Take 1 Container by mouth daily.)   levofloxacin (LEVAQUIN) 250 MG tablet Take 1 tablet (250 mg total) by mouth daily for 7 days.   Multiple Vitamins-Minerals (MULTIVITAMIN PO) Take 1 tablet by mouth daily with breakfast.   Omega-3 Fatty Acids (FISH OIL) 600 MG CAPS Take 600 mg by mouth in the morning and at bedtime.   tamsulosin (FLOMAX) 0.4 MG CAPS capsule Take 1 capsule (0.4 mg total) by mouth daily.   No facility-administered encounter medications on file as of 03/05/2021.    Review of Systems  GENERAL: No fever or chills  MOUTH and THROAT: Denies oral discomfort, gingival pain or bleeding RESPIRATORY: no cough, SOB, DOE, wheezing, hemoptysis CARDIAC: No chest pain, edema or palpitations GI: No abdominal pain, diarrhea, constipation, heart burn, nausea or vomiting GU: had urinary retention during recent hospitalization MUSCULOSKELETAL: Has unusual weakness NEUROLOGICAL: Denies dizziness, syncope, numbness, or headache PSYCHIATRIC: Denies feelings of depression or anxiety. No report of hallucinations, insomnia, paranoia, or agitation   Immunization History  Administered Date(s) Administered   Influenza, High Dose Seasonal PF 01/19/2017   Pertinent  Health Maintenance Due  Topic Date Due   INFLUENZA VACCINE  11/27/2020   DEXA SCAN  Completed   Fall Risk 02/25/2021 02/26/2021 02/26/2021 02/27/2021 02/27/2021  Patient Fall Risk Level High fall risk High fall risk High fall risk High fall risk High fall risk     Vitals:   03/05/21 0836  BP: 133/74  Pulse: 74  Resp: 18  Temp: 98 F (36.7 C)  Weight: 151 lb 9.6 oz (68.8 kg)  Height: 5\' 6"  (1.676 m)   Body mass index  is 24.47 kg/m.  Physical Exam  GENERAL APPEARANCE: Well nourished. In no acute distress. Normal body habitus SKIN:  Skin is warm and dry.  MOUTH and THROAT: Lips are without lesions. Oral mucosa is moist and without lesions.  RESPIRATORY: Breathing is even & unlabored, BS CTAB CARDIAC: RRR, + murmur,no extra heart sounds, no edema GI: Abdomen soft, normal BS, no masses, no tenderness GU: Has Foley catheter with clear yellow urine draining to a urine bag NEUROLOGICAL: There is no tremor. Speech is slurred. Alert to self, unable to identify objects. Right-sided weakness. PSYCHIATRIC:  Affect and behavior are appropriate  Labs reviewed: Recent Labs    02/25/21 0300 02/26/21 0121 02/27/21 0106 02/28/21 0119  NA 134* 135 133* 131*  K 3.7 3.7 3.6 3.3*  CL 103 107 108 104  CO2 20* 22 19* 22  GLUCOSE 98 106*  98 102*  BUN 26* 23 19 11   CREATININE 0.79 0.62 0.59 0.63  CALCIUM 8.5* 8.0* 7.7* 7.8*  MG 2.1  --   --   --   PHOS 3.2  --   --   --    Recent Labs    02/24/21 1107 02/25/21 0300  AST 164* 117*  ALT 61* 53*  ALKPHOS 54 42  BILITOT 1.4* 1.2  PROT 6.4* 5.0*  ALBUMIN 3.3* 2.5*   Recent Labs    02/24/21 1107 02/25/21 0300 02/26/21 0121 02/27/21 0106  WBC 21.3* 18.1* 13.0* 10.3  NEUTROABS 19.0*  --   --   --   HGB 14.8 12.1 10.6* 10.1*  HCT 44.8 36.7 32.0* 30.8*  MCV 83.7 83.6 84.2 85.1  PLT 432* 346 309 305   Lab Results  Component Value Date   TSH 4.823 (H) 05/09/2015   No results found for: HGBA1C No results found for: CHOL, HDL, LDLCALC, LDLDIRECT, TRIG, CHOLHDL  Significant Diagnostic Results in last 30 days:  DG Elbow Complete Left  Result Date: 02/24/2021 CLINICAL DATA:  Status post fall, elbow pain EXAM: LEFT ELBOW - COMPLETE 3+ VIEW COMPARISON:  None. FINDINGS: Subtle lucency involving the superior corner of the olecranon most concerning for a nondisplaced fracture. No other fracture or dislocation. No aggressive osseous lesion. Normal alignment.  Soft tissue are unremarkable. No radiopaque foreign body or soft tissue emphysema. IMPRESSION: 1. Subtle lucency involving the superior corner of the olecranon most concerning for a nondisplaced fracture. Electronically Signed   By: Kathreen Devoid M.D.   On: 02/24/2021 12:44   CT Head Wo Contrast  Result Date: 02/24/2021 CLINICAL DATA:  87 year old female with acute fall and head injury. Initial encounter. EXAM: CT HEAD WITHOUT CONTRAST TECHNIQUE: Contiguous axial images were obtained from the base of the skull through the vertex without intravenous contrast. COMPARISON:  None. FINDINGS: Brain: Obscuration of a portion of the RIGHT insular cortex is noted (series 3: Image 15-16) compatible with an infarct of uncertain chronicity, but appears more remote. No evidence of hemorrhage, hydrocephalus, extra-axial collection or mass lesion/mass effect. Atrophy identified. Mild periventricular white matter hypodensities likely represent chronic small vessel white matter ischemic changes. Vascular: Carotid and vertebral atherosclerotic calcifications are noted. Skull: No acute abnormality. Sinuses/Orbits: No acute abnormality Other: None IMPRESSION: 1. 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 Correlate clinically. No evidence of hemorrhage. 2. Atrophy and probable chronic small vessel white matter ischemic changes. Electronically Signed   By: Margarette Canada M.D.   On: 02/24/2021 12:01   US RENAL  Result Date: 02/27/2021 CLINICAL DATA:  Urinary retention EXAM: RENAL / URINARY TRACT ULTRASOUND COMPLETE COMPARISON:  None. FINDINGS: Right Kidney: Renal measurements: 9.7 x 4.7 x 5.2 cm = volume: 124 mL. Echogenicity within normal limits. No mass or hydronephrosis visualized. Left Kidney: Renal measurements: 10.9 x 4.5 x 4.6 cm = volume: 116 mL. Echogenicity within normal limits. There is a 1.1 x 0.8 x 0.9 cm simple cyst within the left kidney. No solid mass or hydronephrosis visualized.  Urinary bladder: Appears normal for degree of bladder distention. Foley catheter noted within the urinary bladder lumen. Other: None. IMPRESSION: Unremarkable renal ultrasound. Electronically Signed   By: Iven Finn M.D.   On: 02/27/2021 15:03   DG Shoulder Left  Result Date: 02/24/2021 CLINICAL DATA:  Status post fall, shoulder pain EXAM: LEFT SHOULDER - 2+ VIEW COMPARISON:  None. FINDINGS: No acute fracture or dislocation. No aggressive osseous lesion. Normal  alignment. Generalized osteopenia. Mild osteoarthritis of the glenohumeral joint and acromioclavicular joint. Soft tissue are unremarkable. No radiopaque foreign body or soft tissue emphysema. IMPRESSION: No acute osseous injury of the left shoulder. Electronically Signed   By: Kathreen Devoid M.D.   On: 02/24/2021 12:45   ECHOCARDIOGRAM COMPLETE  Result Date: 02/25/2021    ECHOCARDIOGRAM REPORT   Patient Name:   Cathy Silva Dowdle Date of Exam: 02/25/2021 Medical Rec #:  [id number]          Height:       66.0 in Accession #:    [id number]         Weight:       140.0 lb Date of Birth:  02-Oct-1929          BSA:          1.719 m Patient Age:    56 years           BP:           173/67 mmHg Patient Gender: F                  HR:           80 bpm. Exam Location:  Inpatient Procedure: 2D Echo, Color Doppler and Cardiac Doppler Indications:    R07.9* Chest pain, unspecified  History:        Patient has prior history of Echocardiogram examinations, most                 recent 12/25/2020. Aortic Valve Disease; Risk                 Factors:Hypertension and Dyslipidemia.  Sonographer:    Glo Herring Referring Phys: [place]  1. Left ventricular ejection fraction, by estimation, is 60 to 65%. The left ventricle has normal function. The left ventricle has no regional wall motion abnormalities. There is severe concentric left ventricular hypertrophy. Indeterminate diastolic filling due to E-A fusion.  2. Right ventricular systolic  function is normal. The right ventricular size is normal. Tricuspid regurgitation signal is inadequate for assessing PA pressure.  3. The mitral valve is degenerative. No evidence of mitral valve regurgitation. No evidence of mitral stenosis.  4. The aortic valve is calcified. There is severe calcifcation of the aortic valve. There is severe thickening of the aortic valve. Aortic valve regurgitation is not visualized. Moderate aortic valve stenosis. Aortic valve mean gradient measures 21.0 mmHg. Aortic valve Vmax measures 3.18 m/s.  5. The inferior vena cava is normal in size with <50% respiratory variability, suggesting right atrial pressure of 8 mmHg. FINDINGS  Left Ventricle: Left ventricular ejection fraction, by estimation, is 60 to 65%. The left ventricle has normal function. The left ventricle has no regional wall motion abnormalities. The left ventricular internal cavity size was normal in size. There is  severe concentric left ventricular hypertrophy. Indeterminate diastolic filling due to E-A fusion. Right Ventricle: The right ventricular size is normal. No increase in right ventricular wall thickness. Right ventricular systolic function is normal. Tricuspid regurgitation signal is inadequate for assessing PA pressure. Left Atrium: Left atrial size was normal in size. Right Atrium: Right atrial size was normal in size. Pericardium: There is no evidence of pericardial effusion. Mitral Valve: The mitral valve is degenerative in appearance. There is mild thickening of the mitral valve leaflet(s). There is moderate calcification of the anterior mitral valve leaflet(s). Mild to moderate mitral annular calcification. No evidence of mitral valve regurgitation. No  evidence of mitral valve stenosis. Tricuspid Valve: The tricuspid valve is normal in structure. Tricuspid valve regurgitation is trivial. No evidence of tricuspid stenosis. Aortic Valve: The aortic valve is calcified. There is severe calcifcation of the  aortic valve. There is severe thickening of the aortic valve. Aortic valve regurgitation is not visualized. Moderate aortic stenosis is present. Aortic valve mean gradient measures 21.0 mmHg. Aortic valve peak gradient measures 40.4 mmHg. Pulmonic Valve: The pulmonic valve was normal in structure. Pulmonic valve regurgitation is trivial. No evidence of pulmonic stenosis. Aorta: The aortic root is normal in size and structure. Venous: The inferior vena cava is normal in size with less than 50% respiratory variability, suggesting right atrial pressure of 8 mmHg. IAS/Shunts: No atrial level shunt detected by color flow Doppler.  LEFT VENTRICLE PLAX 2D LVIDd:         2.90 cm LVIDs:         2.20 cm LV PW:         1.50 cm LV IVS:        1.50 cm  IVC IVC diam: 1.30 cm LEFT ATRIUM             Index LA diam:        3.20 cm 1.86 cm/m LA Vol (A2C):   35.3 ml 20.54 ml/m LA Vol (A4C):   40.0 ml 23.28 ml/m LA Biplane Vol: 38.6 ml 22.46 ml/m  AORTIC VALVE AV Vmax:           318.00 cm/s AV Vmean:          218.000 cm/s AV VTI:            0.538 m AV Peak Grad:      40.4 mmHg AV Mean Grad:      21.0 mmHg LVOT Vmax:         80.68 cm/s LVOT Vmean:        56.260 cm/s LVOT VTI:          0.158 m LVOT/AV VTI ratio: 0.29  AORTA Ao Root diam: 3.40 cm Ao Asc diam:  2.70 cm  SHUNTS Systemic VTI: 0.16 m Fransico Him MD Electronically signed by Fransico Him MD Signature Date/Time: 02/25/2021/1:57:48 PM    Final    DG Hip Unilat With Pelvis 2-3 Views Left  Result Date: 02/24/2021 CLINICAL DATA:  Status post fall, bilateral hip pain EXAM: DG HIP (WITH OR WITHOUT PELVIS) 2-3V RIGHT; LEFT FEMUR 2 VIEWS; RIGHT FEMUR 2 VIEWS; DG HIP (WITH OR WITHOUT PELVIS) 2-3V LEFT COMPARISON:  None. FINDINGS: No acute fracture or dislocation. No aggressive osseous lesion. Normal alignment. Generalized osteopenia. Mild osteoarthritis of the hips bilaterally. Moderate left medial femorotibial compartment osteoarthritis. Mild osteoarthritis of the right medial  and lateral femorotibial compartments as well as the left lateral femorotibial compartment. Soft tissue are unremarkable. No radiopaque foreign body or soft tissue emphysema. Peripheral vascular atherosclerotic disease. IMPRESSION: 1.  No acute osseous injury of the right hip. 2.  No acute osseous injury of the left hip. 3.  No acute osseous injury of the right femur. 4.  No acute osseous injury of the left femur. 5. Given the patient's age and osteopenia, if there is persistent clinical concern for an occult hip fracture, a MRI of the hip is recommended for increased sensitivity. Electronically Signed   By: Kathreen Devoid M.D.   On: 02/24/2021 12:52   DG Hip Unilat With Pelvis 2-3 Views Right  Result Date: 02/24/2021 CLINICAL DATA:  Status post fall,  bilateral hip pain EXAM: DG HIP (WITH OR WITHOUT PELVIS) 2-3V RIGHT; LEFT FEMUR 2 VIEWS; RIGHT FEMUR 2 VIEWS; DG HIP (WITH OR WITHOUT PELVIS) 2-3V LEFT COMPARISON:  None. FINDINGS: No acute fracture or dislocation. No aggressive osseous lesion. Normal alignment. Generalized osteopenia. Mild osteoarthritis of the hips bilaterally. Moderate left medial femorotibial compartment osteoarthritis. Mild osteoarthritis of the right medial and lateral femorotibial compartments as well as the left lateral femorotibial compartment. Soft tissue are unremarkable. No radiopaque foreign body or soft tissue emphysema. Peripheral vascular atherosclerotic disease. IMPRESSION: 1.  No acute osseous injury of the right hip. 2.  No acute osseous injury of the left hip. 3.  No acute osseous injury of the right femur. 4.  No acute osseous injury of the left femur. 5. Given the patient's age and osteopenia, if there is persistent clinical concern for an occult hip fracture, a MRI of the hip is recommended for increased sensitivity. Electronically Signed   By: Kathreen Devoid M.D.   On: 02/24/2021 12:52   DG FEMUR MIN 2 VIEWS LEFT  Result Date: 02/24/2021 CLINICAL DATA:  Status post fall,  bilateral hip pain EXAM: DG HIP (WITH OR WITHOUT PELVIS) 2-3V RIGHT; LEFT FEMUR 2 VIEWS; RIGHT FEMUR 2 VIEWS; DG HIP (WITH OR WITHOUT PELVIS) 2-3V LEFT COMPARISON:  None. FINDINGS: No acute fracture or dislocation. No aggressive osseous lesion. Normal alignment. Generalized osteopenia. Mild osteoarthritis of the hips bilaterally. Moderate left medial femorotibial compartment osteoarthritis. Mild osteoarthritis of the right medial and lateral femorotibial compartments as well as the left lateral femorotibial compartment. Soft tissue are unremarkable. No radiopaque foreign body or soft tissue emphysema. Peripheral vascular atherosclerotic disease. IMPRESSION: 1.  No acute osseous injury of the right hip. 2.  No acute osseous injury of the left hip. 3.  No acute osseous injury of the right femur. 4.  No acute osseous injury of the left femur. 5. Given the patient's age and osteopenia, if there is persistent clinical concern for an occult hip fracture, a MRI of the hip is recommended for increased sensitivity. Electronically Signed   By: Kathreen Devoid M.D.   On: 02/24/2021 12:52   DG FEMUR, MIN 2 VIEWS RIGHT  Result Date: 02/24/2021 CLINICAL DATA:  Status post fall, bilateral hip pain EXAM: DG HIP (WITH OR WITHOUT PELVIS) 2-3V RIGHT; LEFT FEMUR 2 VIEWS; RIGHT FEMUR 2 VIEWS; DG HIP (WITH OR WITHOUT PELVIS) 2-3V LEFT COMPARISON:  None. FINDINGS: No acute fracture or dislocation. No aggressive osseous lesion. Normal alignment. Generalized osteopenia. Mild osteoarthritis of the hips bilaterally. Moderate left medial femorotibial compartment osteoarthritis. Mild osteoarthritis of the right medial and lateral femorotibial compartments as well as the left lateral femorotibial compartment. Soft tissue are unremarkable. No radiopaque foreign body or soft tissue emphysema. Peripheral vascular atherosclerotic disease. IMPRESSION: 1.  No acute osseous injury of the right hip. 2.  No acute osseous injury of the left hip. 3.  No  acute osseous injury of the right femur. 4.  No acute osseous injury of the left femur. 5. Given the patient's age and osteopenia, if there is persistent clinical concern for an occult hip fracture, a MRI of the hip is recommended for increased sensitivity. Electronically Signed   By: Kathreen Devoid M.D.   On: 02/24/2021 12:52    Assessment/Plan  1. Right sided weakness Acute encephalopathy -  new, will send to hospital for possible stroke -    continue ASA EC 81  mg daily  2.  Hypertension -  BP 133/74,  continue amlodipine 5 mg daily  3.  UTI -Continue levofloxacin 250 mg daily with stop date 03/15/2023    Family/ staff Communication: Discussed plan of care with resident and charge nurse.  Labs/tests ordered:  None  Goals of care:   Short-term care   Durenda Age, DNP, MSN, FNP-BC [facility] [phone] (Monday-Friday 8:00 a.m. - 5:00 p.m.) [phone] (after hours)

## 2021-03-05 NOTE — Consult Note (Addendum)
NEUROLOGY CONSULTATION NOTE   Date of service: March 05, 2021 Patient Name: Cathy Silva MRN:  [id number] DOB:  [birth year] Reason for consult: "R sided weakness and expressive aphasia" Requesting Provider: Lajean Saver, MD _ _ _   _ __   _ __ _ _  __ __   _ __   __ _  History of Present Illness  Cathy Silva is a 87 y.o. female with PMH significant for hyperlipidemia, HTN, hyponatremia, mitral valve prolapse, who presents with right arm weakness, slurred speech and expressive aphasia.  Patient is unable to contribute significantly to the history taking process due to expressive aphasia.  Most of the history is provided by patient's niece who was at the bedside.  Niece reports that last week patient was in the hospital for fall and diagnosed with a UTI. She was discharged to a SNF with plan for rehab before going back to home.  Niece reports that patient did fine and was working with PT on Friday and she herself witnessed patient's sign a check on Friday night.  Patient was also able to work with PT on Saturday morning.  In Saturday afternoon around 1500, she noted the patient was weaker on the right side and reported some numbness in her fingers.  Niece also reports the patient had trouble with language.  She notified staff and the staff reached out to the on-call doctors and did not seem concern. She was provided with cream for her hands. Sunday, she was still the same and on being evaluated on Monday morning, she was sent to the hospital for concern for stroke.  She does not smoke or drink alcohol, has a hx of HTN.  tNK: outside window Thrombectomy: outside window. mRS: 3 for the last few days 2/2 UTI and fall. Was an mRS of 1 and living by herself prior to that and was able to do everything by herself. LKW: 03/03/21, sometime in the morning.  NIHSS components Score: Comment  1a Level of Conscious 0[x] 1[] 2[] 3[]     1b LOC Questions 0[] 1[] 2[x]      1c LOC Commands 0[x] 1[]  2[]      2 Best Gaze 0[x] 1[] 2[]      3 Visual 0[x] 1[] 2[] 3[]     4 Facial Palsy 0[] 1[x] 2[] 3[]     5a Motor Arm - left 0[x] 1[] 2[] 3[] 4[] UN[]   5b Motor Arm - Right 0[] 1[x] 2[] 3[] 4[] UN[]   6a Motor Leg - Left 0[x] 1[] 2[] 3[] 4[] UN[]   6b Motor Leg - Right 0[x] 1[] 2[] 3[] 4[] UN[]   7 Limb Ataxia 0[x] 1[] 2[] 3[] UN[]    8 Sensory 0[x] 1[] 2[] UN[]     9 Best Language 0[] 1[] 2[x] 3[]     10 Dysarthria 0[] 1[x] 2[] UN[]     11  Extinct. and Inattention 0[x]  1[]  2[]       TOTAL: 7   '   ROS   Unable to obtain a detailed review of systems due to expressive aphasia.  Patient denies any pain and seems comfortable.  Past History   Past Medical History:  Diagnosis Date   Allergies    BCC (basal cell carcinoma), arm 2008   Left    Elevated cholesterol    Estrogen deficiency    Fracture    in back   Hx of scarlet fever    Hypercholesteremia  Hyponatremia    Insomnia    Low sodium levels    Melanoma in situ ([place]) 2009   Mitral valve prolapse    Murmur    Psychosocial problem    Thrombocytosis    Past Surgical History:  Procedure Laterality Date   BALLOON DILATION N/A 05/11/2015   Procedure: BALLOON DILATION;  Surgeon: Carol Ada, MD;  Location: [facility] ENDOSCOPY;  Service: Endoscopy;  Laterality: N/A;   DILATION AND CURETTAGE OF UTERUS  05/1984   Prolif   DILATION AND CURETTAGE OF UTERUS  06/1986   benign   ESOPHAGOGASTRODUODENOSCOPY Left 05/11/2015   Procedure: ESOPHAGOGASTRODUODENOSCOPY (EGD);  Surgeon: Carol Ada, MD;  Location: [facility] ENDOSCOPY;  Service: Endoscopy;  Laterality: Left;   EYE SURGERY     bilat cataract   TONSILLECTOMY     TOTAL ABDOMINAL HYSTERECTOMY W/ BILATERAL SALPINGOOPHORECTOMY  05/1987   Leiomyoma   Family History  Problem Relation Age of Onset   Heart attack Maternal Grandmother    CAD Mother    Tuberculosis Father    Hemangiomas Brother    Social History   Socioeconomic History   Marital status: Widowed    Spouse name: Not on  file   Number of children: Not on file   Years of education: Not on file   Highest education level: Not on file  Occupational History   Not on file  Tobacco Use   Smoking status: Never   Smokeless tobacco: Never  Substance and Sexual Activity   Alcohol use: No    Alcohol/week: 0.0 standard drinks   Drug use: No   Sexual activity: Not Currently    Birth control/protection: Surgical    Comment: TVH  Other Topics Concern   Not on file  Social History Narrative   Not on file   Social Determinants of Health   Financial Resource Strain: Not on file  Food Insecurity: Not on file  Transportation Needs: Not on file  Physical Activity: Not on file  Stress: Not on file  Social Connections: Not on file   Allergies  Allergen Reactions   Novocain [Procaine] Shortness Of Breath and Other (See Comments)    "Deathly allergic to this"   Codeine Other (See Comments)    Reaction not recalled by the patient- stated it was "a long time ago"   Pneumococcal Vaccines Other (See Comments)    Reaction not recalled by the patient- stated it was "a long time ago"   Pseudoephedrine Other (See Comments)    Reaction not recalled by the patient- stated it was "a long time ago"   Tetanus Toxoids Swelling and Other (See Comments)    Swelling at injecton site   Tetanus-Diphtheria Toxoids Td Swelling and Other (See Comments)    Swelling where injected    Medications  (Not in a hospital admission)    Vitals   Vitals:   03/05/21 0949 03/05/21 1225 03/05/21 1531 03/05/21 1810  BP: (!) 170/60 (!) 146/62 (!) 167/66 (!) 158/71  Pulse: 70 77 80 74  Resp: 16 14 18 16   Temp: 98 F (36.7 C) 97.7 F (36.5 C)    TempSrc:  Oral    SpO2: 94% 97% 99% 99%     There is no height or weight on file to calculate BMI.  Physical Exam   General: Laying comfortably in bed; in no acute distress.  HENT: Normal oropharynx and mucosa. Normal external appearance of ears and nose.  Neck: Supple, no pain or  tenderness  CV: No JVD. No  peripheral edema.  Pulmonary: Symmetric Chest rise. Normal respiratory effort.  Abdomen: Soft to touch, non-tender.  Ext: No cyanosis, edema, or deformity  Skin: No rash. Normal palpation of skin.   Musculoskeletal: Normal digits and nails by inspection. No clubbing.   Neurologic Examination  Mental status/Cognition: Alert, unable to answer orientation questions secondary to expressive aphasia. good attention.  Speech/language: Fluent but non-sensical speech, comprehension intact, able to name some but not all objects, unable to repeat. Cranial nerves:   CN II Pupils equal and reactive to light, no VF deficits    CN III,IV,VI EOM intact, no gaze preference or deviation, no nystagmus    CN V normal sensation in V1, V2, and V3 segments bilaterally    CN VII ? Mild R facial droop   CN VIII normal hearing to speech    CN IX & X normal palatal elevation, no uvular deviation    CN XI 5/5 head turn and 5/5 shoulder shrug bilaterally    CN XII midline tongue protrusion    Motor:  Muscle bulk: poor, tone normal, pronator drift yes RUE drift. tremor none Mvmt Root Nerve  Muscle Right Left Comments  SA C5/6 Ax Deltoid 4 4+   EF C5/6 Mc Biceps 3 5   EE C6/7/8 Rad Triceps 3 5   WF C6/7 Med FCR 1    WE C7/8 PIN ECU 1    F Ab C8/T1 U ADM/FDI 1 5   HF L1/2/3 Fem Illopsoas 4 4   KE L2/3/4 Fem Quad     DF L4/5 D Peron Tib Ant 5 5   PF S1/2 Tibial Grc/Sol 5 5    Reflexes:  Right Left Comments  Pectoralis      Biceps (C5/6) 2 2   Brachioradialis (C5/6) 2 2    Triceps (C6/7) 2 2    Patellar (L3/4) 2 2    Achilles (S1)      Hoffman      Plantar     Jaw jerk    Sensation:  Light touch Intact throughout   Pin prick    Temperature    Vibration   Proprioception    Coordination/Complex Motor:  - Finger to Nose intact on the left, no obvious ataxia in RUE thou - Heel to shin unable to do. - Rapid alternating movement are slowed - Gait: unsafe to attempt given  stroke and a fall just last week.  Labs   CBC:  Recent Labs  Lab 02/27/21 0106 03/05/21 1025 03/05/21 1039  WBC 10.3 9.5  --   NEUTROABS  --  6.7  --   HGB 10.1* 11.4* 10.9*  HCT 30.8* 33.1* 32.0*  MCV 85.1 81.7  --   PLT 305 369  --     Basic Metabolic Panel:  Lab Results  Component Value Date   NA 127 (L) 03/05/2021   K 3.7 03/05/2021   CO2 23 03/05/2021   GLUCOSE 121 (H) 03/05/2021   BUN 7 (L) 03/05/2021   CREATININE 0.50 03/05/2021   CALCIUM 8.2 (L) 03/05/2021   GFRNONAA >60 03/05/2021   GFRAA >60 05/12/2015   Lipid Panel: No results found for: LDLCALC HgbA1c: No results found for: HGBA1C Urine Drug Screen:     Component Value Date/Time   LABOPIA NONE DETECTED 03/05/2021 1900   COCAINSCRNUR NONE DETECTED 03/05/2021 1900   LABBENZ NONE DETECTED 03/05/2021 1900   AMPHETMU NONE DETECTED 03/05/2021 1900   THCU NONE DETECTED 03/05/2021 1900   LABBARB NONE DETECTED 03/05/2021 1900  Alcohol Level No results found for: [facility]  CT Head without contrast: Personally reviewed and CTH was negative for a large hypodensity concerning for a large territory infarct or hyperdensity concerning for an ICH  MR Angio head without contrast and Carotid Duplex BL: Pending.  MRI Brain: Notable for L MCA stroke on MRI along with a small capsular stroke on the right   Impression   Cathy Silva is a 87 y.o. female with PMH significant for hyperlipidemia, HTN, hyponatremia, mitral valve prolapse, who presents with right arm weakness, slurred speech and expressive aphasia. Her neurologic examination is notable for expressive aphasia, R arm weakness and slurred speech with a NIHSs of 7. She is outside the window for any emergent intervention at the time of presentation to the hospital. MRI Brain with an acute L MCA stroke that probably explains her presentation. Will recommend medicine admission with stroke workup.  Primary Diagnosis:  Cerebral infarction,  unspecified.  Secondary Diagnosis: Essential (primary) hypertension  Recommendations  Plan:  - Frequent Neuro checks per stroke unit protocol - Recommend Vascular imaging with MRA Angio Head without contrast and US Carotid doppler - Had a TTE last week with EF of 60-65%, no IAS on color flow doppler. No need to repeat TTE. - Recommend obtaining Lipid panel with LDL - Please start statin if LDL > 70 - Recommend HbA1c - Antithrombotic - aspirin 81mg  daily  - Recommend DVT ppx - SBP goal - permissive hypertension first 24 h < 220/110. Held home meds.  - Recommend Telemetry monitoring for arrythmia - Recommend bedside swallow screen prior to PO intake. - Stroke education booklet - Recommend PT/OT/SLP consult  ______________________________________________________________________  Plan discussed with Dr. Haskell Riling with the ED team.  Thank you for the opportunity to take part in the care of this patient. If you have any further questions, please contact the neurology consultation attending.  Signed,  H[place]on Pager Number [phone] _ _ _   _ __   _ __ _ _  __ __   _ __   __ _

## 2021-03-05 NOTE — ED Provider Notes (Signed)
Emergency Medicine Provider Triage Evaluation Note  Cathy Silva , a 87 y.o. female  was evaluated in triage.  Pt complains of right arm weakness and slurred speech.  Patient reports that her right arm has been weak for the past week, but niece visited patient at rehab facility and reports she did not notice it until Saturday. Has become more confused with slurred speech.  Outside of window for code stroke.  Denies chest pain, shortness of breath or any other complaints.  Review of Systems  Positive: Weakness, slurred speech, altered mental status Negative: Numbness, chest pain, shortness of breath, fever  Physical Exam  BP (!) 170/60 (BP Location: Left Arm)   Pulse 70   Temp 98 F (36.7 C)   Resp 16   LMP 05/31/1987   SpO2 94%  Gen:   Awake, no distress  Resp:  Normal effort  MSK:   With flaccid paralysis of the right arm, but no other weakness or numbness noted patient does have some slurred and slowed speech, alert and oriented to person, able to tell me the day of the week but it was significantly Other:    Medical Decision Making  Medically screening exam initiated at 10:15 AM.  Appropriate orders placed.  Cathy Silva was informed that the remainder of the evaluation will be completed by another provider, this initial triage assessment does not replace that evaluation, and the importance of remaining in the ED until their evaluation is complete.  Patient is outside of window for code stroke but presentation very concerning for stroke, will initiate work-up with labs and CT.   Jacqlyn Larsen, PA-C 03/05/21 1019    Daleen Bo, MD 03/05/21 2040

## 2021-03-05 NOTE — ED Triage Notes (Signed)
EMS stated, she has a complete flaccid right arm since Saturday afternoon. Slurred speech, Alert and knows she is at the hospital. Niece saw her on Friday, SAt, and Sun . And SAturday noticed she had difficult with the arm and SAt,  complete rt arm flaccid.

## 2021-03-05 NOTE — ED Provider Notes (Signed)
[facility] EMERGENCY DEPARTMENT Provider Note   CSN: [id number] Arrival date & time: 03/05/21  9562     History Chief Complaint  Patient presents with   Weakness   Stroke Symptoms    Cathy Silva is a 87 y.o. female.   Weakness  87 year old female with PMH significant for HLD, chronic hyponatremia, MVP, melanoma, and others as below who presents to the ED with complaint of weakness.  Last known normal was Saturday morning, was found at 3:00 PM on Saturday to have right-sided weakness, right-sided facial droop, and confusion.  Her normal neurologic baseline is A&O x3, conversant, and with full ROM and strength of all extremities.  Patient fell on 10/29 and was admitted to the hospitalist service for rhabdomyolysis, urinary retention, and elevated troponin and was discharged to rehab on 11/2.  On arrival, the patient states she is feeling confused and is unable to answer further questions appropriately.  Does not appear in any acute distress and gestures to her right hand and demonstrates its weakness.  Takes a daily aspirin but does not use any other anticoagulation.  Patient's niece, Cathy Silva, present at bedside and informs that this is significantly different from the patient's normal baseline.  She has not had any recent falls or head injuries to her knowledge, and has not had any recent illnesses.  Patient lives in a facility.  Past Medical History:  Diagnosis Date   Allergies    BCC (basal cell carcinoma), arm 2008   Left    Elevated cholesterol    Estrogen deficiency    Fracture    in back   Hx of scarlet fever    Hypercholesteremia    Hyponatremia    Insomnia    Low sodium levels    Melanoma in situ ([place]) 2009   Mitral valve prolapse    Murmur    Psychosocial problem    Thrombocytosis     Patient Active Problem List   Diagnosis Date Noted   CVA (cerebral vascular accident) ([place]) 03/05/2021   Acute CVA (cerebrovascular accident)  ([place]) 03/05/2021   Acute urinary retention 03/02/2021   UTI (urinary tract infection) 03/02/2021   Elevated LFTs 03/02/2021   Elevated troponin 03/02/2021   Leukocytosis 03/02/2021   Normocytic normochromic anemia 03/02/2021   Rhabdomyolysis 02/24/2021   Nonrheumatic aortic valve stenosis 06/26/2020   Aortic atherosclerosis ([place]) 06/26/2020   Hyperlipidemia 06/26/2020   RBBB 06/26/2020   Hyponatremia 05/07/2015   Essential hypertension 05/07/2015   Nausea & vomiting 05/07/2015   Back pain 05/07/2015   Mid back pain     Past Surgical History:  Procedure Laterality Date   BALLOON DILATION N/A 05/11/2015   Procedure: BALLOON DILATION;  Surgeon: Carol Ada, MD;  Location: [facility];  Service: Endoscopy;  Laterality: N/A;   DILATION AND CURETTAGE OF UTERUS  05/1984   Prolif   DILATION AND CURETTAGE OF UTERUS  06/1986   benign   ESOPHAGOGASTRODUODENOSCOPY Left 05/11/2015   Procedure: ESOPHAGOGASTRODUODENOSCOPY (EGD);  Surgeon: Carol Ada, MD;  Location: [facility] ENDOSCOPY;  Service: Endoscopy;  Laterality: Left;   EYE SURGERY     bilat cataract   TONSILLECTOMY     TOTAL ABDOMINAL HYSTERECTOMY W/ BILATERAL SALPINGOOPHORECTOMY  05/1987   Leiomyoma     OB History     Gravida  0   Para  0   Term  0   Preterm  0   AB  0   Living  0      SAB  0   IAB  0   Ectopic  0   Multiple  0   Live Births              Family History  Problem Relation Age of Onset   Heart attack Maternal Grandmother    CAD Mother    Tuberculosis Father    Hemangiomas Brother     Social History   Tobacco Use   Smoking status: Never   Smokeless tobacco: Never  Substance Use Topics   Alcohol use: No    Alcohol/week: 0.0 standard drinks   Drug use: No    Home Medications Prior to Admission medications   Medication Sig Start Date End Date Taking? Authorizing Provider  amLODipine (NORVASC) 5 MG tablet Take 1 tablet (5 mg total) by mouth daily. 03/01/21 03/31/21 Yes Kayleen Memos,  DO  Ascorbic Acid (VITAMIN C) 1000 MG tablet Take 1,000 mg by mouth daily.   Yes [provider]  aspirin EC 81 MG EC tablet Take 1 tablet (81 mg total) by mouth daily. Swallow whole. 03/01/21 08/28/21 Yes Kayleen Memos, DO  Calcium Carbonate-Vitamin D (CALCIUM + D PO) Take 1,200 mg by mouth.   Yes [provider]  feeding supplement (BOOST / RESOURCE BREEZE) LIQD Take 1 Container by mouth 3 (three) times daily between meals. Patient taking differently: Take 1 Container by mouth daily. 05/12/15  Yes Mikhail, Velta Addison, DO  levofloxacin (LEVAQUIN) 250 MG tablet Take 1 tablet (250 mg total) by mouth daily for 7 days. 03/01/21 03/08/21 Yes Kayleen Memos, DO  Multiple Vitamins-Minerals (MULTIVITAMIN ADULT) CHEW Chew 1 tablet by mouth daily.   Yes [provider]  Multiple Vitamins-Minerals (MULTIVITAMIN PO) Take 1 tablet by mouth daily with breakfast.   Yes [provider]  Omega-3 Fatty Acids (FISH OIL) 600 MG CAPS Take 600 mg by mouth in the morning and at bedtime.   Yes [provider]  tamsulosin (FLOMAX) 0.4 MG CAPS capsule Take 1 capsule (0.4 mg total) by mouth daily. 03/01/21 03/31/21 Yes Kayleen Memos, DO  carvedilol (COREG) 6.25 MG tablet Take 1 tablet (6.25 mg total) by mouth 2 (two) times daily with a meal. Patient not taking: No sig reported 02/28/21 03/30/21  Kayleen Memos, DO    Allergies    Novocain [procaine], Codeine, Pneumococcal vaccines, Pseudoephedrine, Tetanus toxoids, and Tetanus-diphtheria toxoids td  Review of Systems   Review of Systems  Unable to perform ROS: Mental status change   Physical Exam Updated Vital Signs BP (!) 149/64 (BP Location: Left Arm)   Pulse 82   Temp (!) 97.5 F (36.4 C) (Oral)   Resp 18   LMP 05/31/1987   SpO2 98%   Physical Exam Vitals and nursing note reviewed.  Constitutional:      General: She is not in acute distress.    Appearance: Normal appearance. She is well-developed. She is not  ill-appearing, toxic-appearing or diaphoretic.  HENT:     Head: Normocephalic and atraumatic.     Right Ear: External ear normal.     Left Ear: External ear normal.     Nose: Nose normal.     Mouth/Throat:     Mouth: Mucous membranes are moist.     Pharynx: Oropharynx is clear.  Eyes:     General: No scleral icterus.    Extraocular Movements: Extraocular movements intact.     Conjunctiva/sclera: Conjunctivae normal.     Pupils: Pupils are equal, round, and reactive to light.  Cardiovascular:     Rate and Rhythm: Normal rate and regular rhythm.     Pulses: Normal pulses.     Heart sounds: Normal heart sounds. No murmur heard. Pulmonary:     Effort: Pulmonary effort is normal. No respiratory distress.     Breath sounds: Normal breath sounds.  Abdominal:     General: There is no distension.     Palpations: Abdomen is soft.     Tenderness: There is no abdominal tenderness.  Musculoskeletal:     Cervical back: Normal range of motion and neck supple. No rigidity or tenderness.  Lymphadenopathy:     Cervical: No cervical adenopathy.  Skin:    General: Skin is warm and dry.     Capillary Refill: Capillary refill takes less than 2 seconds.     Findings: Bruising present.     Comments: Diffuse bruising of various ages  Neurological:     Mental Status: She is alert. She is confused.     GCS: GCS eye subscore is 4. GCS verbal subscore is 4. GCS motor subscore is 5.     Cranial Nerves: Facial asymmetry present.     Sensory: Sensory deficit present.     Motor: Weakness and abnormal muscle tone present. No tremor, atrophy or seizure activity.     Coordination: Finger-Nose-Finger Test abnormal.     Comments: Right facial droop, forehead sparing.  Does not respond to painful stimuli of right upper extremity.  Flaccid RUE, 0/5 strength.  5/5 strength in LLE and LUE.  4/5 strength in RLE. Anisocoria with L pupil 71mm, R 38mm.  Protecting airway.  Alert and oriented x1.  Patient intermittently  following simple, one-step commands.  Asked expressive and receptive aphasia. Unable to perform finger-to-nose, holds nasal bridge tightly.  Psychiatric:        Mood and Affect: Mood normal.        Behavior: Behavior normal. Behavior is cooperative.    ED Results / Procedures / Treatments   Labs (all labs ordered are listed, but only abnormal results are displayed) Labs Reviewed  CBC - Abnormal; Notable for the following components:      Result Value   Hemoglobin 11.4 (*)    HCT 33.1 (*)    All other components within normal limits  DIFFERENTIAL - Abnormal; Notable for the following components:   Monocytes Absolute 1.4 (*)    All other components within normal limits  COMPREHENSIVE METABOLIC PANEL - Abnormal; Notable for the following components:   Sodium 126 (*)    Chloride 95 (*)    Glucose, Bld 119 (*)    Calcium 8.2 (*)    Total Protein 5.4 (*)    Albumin 2.6 (*)    All other components within normal limits  URINALYSIS, ROUTINE W REFLEX MICROSCOPIC - Abnormal; Notable for the following components:   Ketones, ur 5 (*)    All other components within normal limits  LIPID PANEL - Abnormal; Notable for the following components:   LDL Cholesterol 141 (*)    All other components within normal limits  BASIC METABOLIC PANEL - Abnormal; Notable for the following components:   Sodium 126 (*)    Chloride 95 (*)    Glucose, Bld 102 (*)    BUN 6 (*)    Calcium 8.0 (*)    All other components within normal limits  URINALYSIS, ROUTINE W REFLEX MICROSCOPIC - Abnormal; Notable for the following components:   Ketones, ur 5 (*)    All other  components within normal limits  OSMOLALITY - Abnormal; Notable for the following components:   Osmolality 265 (*)    All other components within normal limits  MAGNESIUM - Abnormal; Notable for the following components:   Magnesium 1.6 (*)    All other components within normal limits  COMPREHENSIVE METABOLIC PANEL - Abnormal; Notable for the  following components:   Sodium 125 (*)    Chloride 96 (*)    Glucose, Bld 101 (*)    Calcium 8.0 (*)    Total Protein 4.9 (*)    Albumin 2.4 (*)    All other components within normal limits  BRAIN NATRIURETIC PEPTIDE - Abnormal; Notable for the following components:   B Natriuretic Peptide 277.1 (*)    All other components within normal limits  CBC WITH DIFFERENTIAL/PLATELET - Abnormal; Notable for the following components:   RBC 3.79 (*)    Hemoglobin 10.6 (*)    HCT 31.0 (*)    Monocytes Absolute 1.2 (*)    All other components within normal limits  COMPREHENSIVE METABOLIC PANEL - Abnormal; Notable for the following components:   Sodium 127 (*)    Chloride 96 (*)    Glucose, Bld 103 (*)    BUN 7 (*)    Calcium 7.9 (*)    Total Protein 5.1 (*)    Albumin 2.4 (*)    All other components within normal limits  BRAIN NATRIURETIC PEPTIDE - Abnormal; Notable for the following components:   B Natriuretic Peptide 294.4 (*)    All other components within normal limits  CBC WITH DIFFERENTIAL/PLATELET - Abnormal; Notable for the following components:   Hemoglobin 11.3 (*)    HCT 33.3 (*)    Monocytes Absolute 1.2 (*)    All other components within normal limits  I-STAT CHEM 8, ED - Abnormal; Notable for the following components:   Sodium 127 (*)    Chloride 94 (*)    BUN 7 (*)    Glucose, Bld 121 (*)    Calcium, Ion 1.09 (*)    Hemoglobin 10.9 (*)    HCT 32.0 (*)    All other components within normal limits  RESP PANEL BY RT-PCR (FLU A&B, COVID) ARPGX2  ETHANOL  PROTIME-INR  APTT  RAPID URINE DRUG SCREEN, HOSP PERFORMED  TSH  MAGNESIUM  HEMOGLOBIN A1C  SODIUM, URINE, RANDOM  OSMOLALITY, URINE  URIC ACID  UREA NITROGEN, URINE  CREATININE, URINE, RANDOM  MAGNESIUM  MAGNESIUM  COMPREHENSIVE METABOLIC PANEL  BRAIN NATRIURETIC PEPTIDE  CBC WITH DIFFERENTIAL/PLATELET    EKG EKG Interpretation  Date/Time:  Monday March 05 2021 09:42:03 EST Ventricular Rate:   70 PR Interval:  206 QRS Duration: 138 QT Interval:  424 QTC Calculation: 457 R Axis:   57 Text Interpretation: Normal sinus rhythm Possible Left atrial enlargement Left ventricular hypertrophy with QRS widening ( Cornell product ) Cannot rule out Septal infarct , age undetermined Confirmed by Randal Buba, April ([id number]) on 03/06/2021 7:55:42 AM  Radiology CT HEAD WO CONTRAST (5MM)  Result Date: 03/07/2021 CLINICAL DATA:  Stroke, follow up EXAM: CT HEAD WITHOUT CONTRAST TECHNIQUE: Contiguous axial images were obtained from the base of the skull through the vertex without intravenous contrast. COMPARISON:  MRI 03/05/2021, head CT 03/05/2021 FINDINGS: Brain: There is mild loss of gray-white matter differentiation in the left MCA territory in the posterior left frontal and the parietal lobe consistent with infarct, as seen on MRI. Hypoattenuation in the right frontal corona radiata extending towards the insula also consistent  with infarct. These findings are increased in conspicuity in comparison to prior CT.No concerning mass effect. No acute intracranial hemorrhage. The basal cisterns are patent.The ventricles are unchanged in size.Scattered subcortical and periventricular white matter hypodensities, nonspecific but likely sequela of chronic small vessel ischemic disease.Mild cerebral atrophy Vascular: Vertebral artery calcifications. Skull: Normal. Negative for fracture or focal lesion. Sinuses/Orbits: No acute finding. Other: None. IMPRESSION: Evolving left MCA territory infarct involving the posterior left frontoparietal region and additional infarct in the right frontal corona radiata. No concerning mass effect. No acute intracranial hemorrhage. Underlying cerebral atrophy with unchanged mild sequela of chronic small vessel ischemic disease. Electronically Signed   By: Maurine Simmering M.D.   On: 03/07/2021 09:18    Procedures Procedures   Medications Ordered in ED Medications  aspirin chewable tablet 81  mg (81 mg Oral Given 03/08/21 1023)    Or  aspirin suppository 300 mg ( Rectal See Alternative 03/08/21 1023)  feeding supplement (BOOST / RESOURCE BREEZE) liquid 1 Container (1 Container Oral Given 03/08/21 1029)  multivitamin with minerals tablet 1 tablet (1 tablet Oral Given 03/08/21 0902)  acetaminophen (TYLENOL) tablet 650 mg (has no administration in time range)    Or  acetaminophen (TYLENOL) 160 MG/5ML solution 650 mg (has no administration in time range)    Or  acetaminophen (TYLENOL) suppository 650 mg (has no administration in time range)  senna-docusate (Senokot-S) tablet 1 tablet (has no administration in time range)  tamsulosin (FLOMAX) capsule 0.4 mg (0.4 mg Oral Given 03/08/21 1023)  hydrALAZINE (APRESOLINE) injection 10 mg (has no administration in time range)  enoxaparin (LOVENOX) injection 30 mg (30 mg Subcutaneous Given 03/08/21 1709)  rosuvastatin (CRESTOR) tablet 20 mg (20 mg Oral Given 03/08/21 1023)  Chlorhexidine Gluconate Cloth 2 % PADS 6 each (6 each Topical Given 03/08/21 1029)  lactated ringers bolus 1,000 mL (0 mLs Intravenous Stopped 03/05/21 2143)   stroke: mapping our early stages of recovery book ( Does not apply Given 03/06/21 0130)  potassium chloride SA (KLOR-CON) CR tablet 20 mEq (20 mEq Oral Given 03/06/21 1304)  magnesium sulfate IVPB 2 g 50 mL (0 g Intravenous Stopped 03/08/21 0908)  furosemide (LASIX) tablet 40 mg (40 mg Oral Given 03/07/21 0824)  potassium chloride SA (KLOR-CON) CR tablet 40 mEq (40 mEq Oral Given 03/07/21 0824)  lactated ringers bolus 500 mL (0 mLs Intravenous Stopped 03/08/21 0908)    ED Course  I have reviewed the triage vital signs and the nursing notes.  Pertinent labs & imaging results that were available during my care of the patient were reviewed by me and considered in my medical decision making (see chart for details).    MDM Rules/Calculators/A&P                           JAIANNA NICOLL is a 87 y.o. female  presenting with weakness. Initial VS sig for HTN.  EKG interpretation: NSR, rate 70 bpm, prolonged PR interval, intervals otherwise WNL.  Criteria for LVH, no ST elevations or depressions.  Labs: COVID/flu negative. Coags wnl.  Mild microcytic anemia near prior baseline.  Hyponatremia decreased from prior baseline with hypoalbuminemia.  TSH and mag wnl. UDS negative. UA wnl  Imaging: CT head negative for acute intracranial findings.  MRI brain concerning for nonhemorrhagic left MCA infarct without mass-effect imaging was reviewed by radiology and personally by me.  DDX considered: Hemorrhagic stroke, head injury, meningitis, encephalopathy, drug intoxication or withdrawal, hypertensive emergency.  History, examination, and objective data most consistent with ischemic stroke with delayed presentation. No systemic infectious signs or symptoms. No h/o drug intoxication or withdrawal, BP well controlled while in ED.  Neurologic deficits consistent with MRI findings. No meningismus or rash.  Medications: Given as above.  Neurology consulted, recommends admission for further work-up.  Re-evaluated prior to admission. Hemodynamically stable and in no acute distress.  Admitted to hospitalist in stable condition.  Neurology continues to follow. Patient understands and agrees with the plan. Family updated at the bedside.   The plan for this patient was discussed with my attending physician, who voiced agreement and who oversaw evaluation and treatment of this patient.     Note: Estate manager/land agent was used in the creation of this note.  Final Clinical Impression(s) / ED Diagnoses Final diagnoses:  Cerebral infarction, unspecified mechanism [facility])    Rx / [facility] Orders ED Discharge Orders     None        Cherly Hensen, DO 03/08/21 1721    Lajean Saver, MD 03/12/21 1550

## 2021-03-05 NOTE — H&P (Signed)
History and Physical    Cathy Silva AVW:[id number] DOB: 1929-10-16 DOA: 03/05/2021  PCP: [name], MD   Patient coming from: Skilled rehab facility  Chief Complaint: Weakness of right upper extremity and slurred speech  HPI: Cathy Silva is a 87 y.o. female with medical history significant for HTN, anemia, hyponatremia, recent admission for rhabdomyolysis after fall at home.  She was discharged last week to a skilled rehabilitation facility.  Her niece was visiting her every day on Saturday afternoon she noted that Cathy Silva was not acting normally and had slurred speech and right facial droop and she was not able to move or use her right arm or hand.  She was also more confused than normal.  At baseline she was alert and oriented x3 and to do all of her own activities of daily living.  The niece notified the staff at the facility and they stated they will take a look at her and have her sent to the hospital for evaluation as soon as they could.  When the niece returned that evening she noticed that her speech was not improved and she had increased confusion and weakness of her right arm.  Again she brought this to the attention of the staff but despite this it took them over 48 hours to have her sent the emergency room by EMS for evaluation.  She is currently very different from her baseline according to the niece.  She will respond to basic simple commands but is not able to use her right arm and does not always answer appropriately when speaking.  These all states that her speech is thicker and slurred than it normally is.  There is no report of any fever, cough, shortness of breath, nausea vomiting or diarrhea. Prior to her fall and admission for rhabdomyolysis she was living independently.  Goal has been to return to home to live independently no tobacco alcohol or illicit drug use  ED Course: In the emergency room Cathy Silva has had elevated blood pressure readings but is  otherwise hemodynamically stable with normal oxygen saturation on room air.  MRI of the brain revealed an acute CVA.  There is no patient been seen by neurology in the emergency room.  Lab work reveals sodium of 127 potassium 3.7 chloride 94 creatinine 0.50 BUN 7 glucose 121 bicarb 23 calcium 8.2 magnesium 1.8 albumin 2.6 alkaline phosphatase 47 AST 24 ALT 31 total bilirubin 1.1.  Corrected calcium is 9.3.  CBC is unremarkable. INR 1.0.  TSH 2.867.  COVID-negative, influenza A and B are negative.  Hospitalist service been asked to admit for further management  Review of Systems: Cannot accurately obtain review of systems secondary to CVA and expressive aphasia  Past Medical History:  Diagnosis Date   Allergies    BCC (basal cell carcinoma), arm 2008   Left    Elevated cholesterol    Estrogen deficiency    Fracture    in back   Hx of scarlet fever    Hypercholesteremia    Hyponatremia    Insomnia    Low sodium levels    Melanoma in situ ([place]) 2009   Mitral valve prolapse    Murmur    Psychosocial problem    Thrombocytosis     Past Surgical History:  Procedure Laterality Date   BALLOON DILATION N/A 05/11/2015   Procedure: BALLOON DILATION;  Surgeon: Carol Ada, MD;  Location: [facility];  Service: Endoscopy;  Laterality: N/A;   DILATION AND CURETTAGE  OF UTERUS  05/1984   Prolif   DILATION AND CURETTAGE OF UTERUS  06/1986   benign   ESOPHAGOGASTRODUODENOSCOPY Left 05/11/2015   Procedure: ESOPHAGOGASTRODUODENOSCOPY (EGD);  Surgeon: Carol Ada, MD;  Location: [facility] ENDOSCOPY;  Service: Endoscopy;  Laterality: Left;   EYE SURGERY     bilat cataract   TONSILLECTOMY     TOTAL ABDOMINAL HYSTERECTOMY W/ BILATERAL SALPINGOOPHORECTOMY  05/1987   Leiomyoma    Social History  reports that she has never smoked. She has never used smokeless tobacco. She reports that she does not drink alcohol and does not use drugs.  Allergies  Allergen Reactions   Novocain [Procaine] Shortness Of  Breath and Other (See Comments)    "Deathly allergic to this"   Codeine Other (See Comments)    Reaction not recalled by the patient- stated it was "a long time ago"   Pneumococcal Vaccines Other (See Comments)    Reaction not recalled by the patient- stated it was "a long time ago"   Pseudoephedrine Other (See Comments)    Reaction not recalled by the patient- stated it was "a long time ago"   Tetanus Toxoids Swelling and Other (See Comments)    Swelling at injecton site   Tetanus-Diphtheria Toxoids Td Swelling and Other (See Comments)    Swelling where injected    Family History  Problem Relation Age of Onset   Heart attack Maternal Grandmother    CAD Mother    Tuberculosis Father    Hemangiomas Brother      Prior to Admission medications   Medication Sig Start Date End Date Taking? Authorizing Provider  amLODipine (NORVASC) 5 MG tablet Take 1 tablet (5 mg total) by mouth daily. 03/01/21 03/31/21 Yes Kayleen Memos, DO  Ascorbic Acid (VITAMIN C) 1000 MG tablet Take 1,000 mg by mouth daily.   Yes [provider]  aspirin EC 81 MG EC tablet Take 1 tablet (81 mg total) by mouth daily. Swallow whole. 03/01/21 08/28/21 Yes Kayleen Memos, DO  Calcium Carbonate-Vitamin D (CALCIUM + D PO) Take 1,200 mg by mouth.   Yes [provider]  feeding supplement (BOOST / RESOURCE BREEZE) LIQD Take 1 Container by mouth 3 (three) times daily between meals. Patient taking differently: Take 1 Container by mouth daily. 05/12/15  Yes Mikhail, Velta Addison, DO  levofloxacin (LEVAQUIN) 250 MG tablet Take 1 tablet (250 mg total) by mouth daily for 7 days. 03/01/21 03/08/21 Yes Kayleen Memos, DO  Multiple Vitamins-Minerals (MULTIVITAMIN ADULT) CHEW Chew 1 tablet by mouth daily.   Yes [provider]  Multiple Vitamins-Minerals (MULTIVITAMIN PO) Take 1 tablet by mouth daily with breakfast.   Yes [provider]  Omega-3 Fatty Acids (FISH OIL) 600 MG CAPS Take 600 mg by mouth in the  morning and at bedtime.   Yes [provider]  tamsulosin (FLOMAX) 0.4 MG CAPS capsule Take 1 capsule (0.4 mg total) by mouth daily. 03/01/21 03/31/21 Yes Kayleen Memos, DO  carvedilol (COREG) 6.25 MG tablet Take 1 tablet (6.25 mg total) by mouth 2 (two) times daily with a meal. Patient not taking: No sig reported 02/28/21 03/30/21  Kayleen Memos, DO    Physical Exam: Vitals:   03/05/21 1225 03/05/21 1531 03/05/21 1810 03/05/21 2202  BP: (!) 146/62 (!) 167/66 (!) 158/71 (!) 155/64  Pulse: 77 80 74 76  Resp: 14 18 16 16   Temp: 97.7 F (36.5 C)     TempSrc: Oral     SpO2: 97%  99% 99% 99%    Constitutional: NAD, calm, comfortable Vitals:   03/05/21 1225 03/05/21 1531 03/05/21 1810 03/05/21 2202  BP: (!) 146/62 (!) 167/66 (!) 158/71 (!) 155/64  Pulse: 77 80 74 76  Resp: 14 18 16 16   Temp: 97.7 F (36.5 C)     TempSrc: Oral     SpO2: 97% 99% 99% 99%   General: WDWN, Alert. Oriented to self Eyes: EOMI, PERRL, conjunctivae normal.  Sclera nonicteic. No nystagmus HENT:  [place]/AT, external ears normal.  Nares patent without epistasis.  Mucous membranes are moist. Posterior pharynx clear  Neck: Soft, normal range of motion, supple, no masses, no thyromegaly.  Trachea midline Respiratory: clear to auscultation bilaterally, no wheezing, no crackles. Normal respiratory effort. No accessory muscle use.  Cardiovascular: Regular rate and rhythm, Has 3/6 systolic murmur. No rubs / gallops. No extremity edema. 2+ pedal pulses. No carotid bruits.  Abdomen: Soft, no tenderness, nondistended, no rebound or guarding.  No masses palpated. No hepatosplenomegaly. Bowel sounds normoactive Musculoskeletal: Limited movement of right arm with flaccid right hand. no cyanosis. No joint deformity upper and lower extremities. Normal muscle tone in lower extremities.   Skin: Warm, dry, intact no rashes, lesions, ulcers. No induration. Bruises on extremities Neurologic: Mild right facial droop. Mild  expressive aphasia. Can follow simple commands. Does not respond to painful stimuli of right upper extremity. +1 patella DTR +1 bilaterally. Strength 5/5 in LU and LL extremity. Strength 0/5 in Right Upper extremity, 4/5 in right lower extremity. No tremor Psychiatric: Normal mood.    Labs on Admission: I have personally reviewed following labs and imaging studies  CBC: Recent Labs  Lab 02/27/21 0106 03/05/21 1025 03/05/21 1039  WBC 10.3 9.5  --   NEUTROABS  --  6.7  --   HGB 10.1* 11.4* 10.9*  HCT 30.8* 33.1* 32.0*  MCV 85.1 81.7  --   PLT 305 369  --     Basic Metabolic Panel: Recent Labs  Lab 02/27/21 0106 02/28/21 0119 03/05/21 1025 03/05/21 1039 03/05/21 1853  NA 133* 131* 126* 127*  --   K 3.6 3.3* 3.5 3.7  --   CL 108 104 95* 94*  --   CO2 19* 22 23  --   --   GLUCOSE 98 102* 119* 121*  --   BUN 19 11 8  7*  --   CREATININE 0.59 0.63 0.57 0.50  --   CALCIUM 7.7* 7.8* 8.2*  --   --   MG  --   --   --   --  1.8    GFR: Estimated Creatinine Clearance: 44.6 mL/min (by C-G formula based on SCr of 0.5 mg/dL).  Liver Function Tests: Recent Labs  Lab 03/05/21 1025  AST 24  ALT 31  ALKPHOS 47  BILITOT 1.1  PROT 5.4*  ALBUMIN 2.6*    Urine analysis:    Component Value Date/Time   COLORURINE YELLOW 03/05/2021 1900   APPEARANCEUR CLEAR 03/05/2021 1900   LABSPEC 1.009 03/05/2021 1900   PHURINE 7.0 03/05/2021 1900   GLUCOSEU NEGATIVE 03/05/2021 1900   HGBUR NEGATIVE 03/05/2021 1900   BILIRUBINUR NEGATIVE 03/05/2021 1900   KETONESUR 5 (A) 03/05/2021 1900   PROTEINUR NEGATIVE 03/05/2021 1900   NITRITE NEGATIVE 03/05/2021 1900   LEUKOCYTESUR NEGATIVE 03/05/2021 1900    Radiological Exams on Admission: CT HEAD WO CONTRAST  Result Date: 03/05/2021 CLINICAL DATA:  Slurred speech.  Right arm weakness. EXAM: CT HEAD WITHOUT CONTRAST TECHNIQUE: Contiguous axial images  were obtained from the base of the skull through the vertex without intravenous contrast.  COMPARISON:  02/24/2021 FINDINGS: Brain: Moderate low density in the periventricular white matter likely related to small vessel disease. Expected cerebral and cerebellar atrophy for age. Similar subtle hypoattenuation in the right insula likely due to remote infarct. No mass lesion, hemorrhage, hydrocephalus, acute infarct, intra-axial, or extra-axial fluid collection. Vascular: Intracranial atherosclerosis. No hyperdense vessel or unexpected calcification. Skull: Normal Sinuses/Orbits: Normal imaged portions of the orbits and globes. Clear paranasal sinuses and mastoid air cells. Other: None. IMPRESSION: No acute intracranial abnormality. Cerebral atrophy and small vessel ischemic change. Electronically Signed   By: Abigail Miyamoto M.D.   On: 03/05/2021 11:30    EKG: Independently reviewed.  EKG shows normal sinus rhythm with no acute ST elevation or depression.  LVH by voltage criteria.  QTc 457  Assessment/Plan Principal Problem:   CVA (cerebral vascular accident) Admit to medical telemetry floor.  MRI of the brain and MRA of the head have been obtained and showed acute stroke. Patient been evaluated by neurology. She had an echocardiogram last week while in the hospital and therefore does not need to be repeated at this time. Check lipid panel and hemoglobin A1c in the morning Patient has past 24 hours since symptoms began and therefore can be resumed on antihypertensive therapy Patient given n.p.o. until after swallow screening.  If passes swallow screen will start on heart healthy diet Consult PT/OT/ST in the morning for evaluation per stroke protocol Fall precautions  Active Problems:   Essential hypertension Continue Norvasc and Coreg.  Monitor blood pressure    Hyponatremia Mild decrease in sodium to 131.  IV fluid hydration with LR.  Recheck electrolytes and renal function in morning    Normocytic normochromic anemia Stable.  Recheck CBC in morning    DVT prophylaxis: SCDs for  DVT prophylaxis with acute CVA. Anticoagulation not used in order to decrease risk of hemorrhagic conversion.  Code Status:   Full Code  Family Communication:  Diagnosis and plan discussed with patient and her niece who is at bedside.  Verbalized understanding and both agree to plan.  Further recommendations as clinically indicated Disposition Plan:   Patient is from:  Rehab facility  Anticipated DC to:  Will need a new Rehab facility on discharge as family does not want her to return to the same one.  Discharge planning will need to be consulted to assist with placement  Anticipated DC date:  Anticipate more than 2 midnight stay in the hospital  Consults called:  Neurology  Admission status:  Inpatient   Yevonne Aline Doloros Kwolek MD [facility]  How to contact the [facility] Attending or Consulting provider [place] or covering provider during after hours [place], for this patient?   Check the care team in [facility] and look for a) attending/consulting [facility] provider listed and b) the [facility] team listed Log into [email or web address] and use [place]'s universal password to access. If you do not have the password, please contact the hospital operator. Locate the [facility] provider you are looking for under [facility] and page to a number that you can be directly reached. If you still have difficulty reaching the provider, please page the [facility] (Director on Call) for the Hospitalists listed on amion for assistance.  03/05/2021, 10:17 PM

## 2021-03-06 ENCOUNTER — Inpatient Hospital Stay (HOSPITAL_COMMUNITY): Payer: Medicare Other

## 2021-03-06 DIAGNOSIS — I6300 Cerebral infarction due to thrombosis of unspecified precerebral artery: Secondary | ICD-10-CM

## 2021-03-06 LAB — URINALYSIS, ROUTINE W REFLEX MICROSCOPIC
Bilirubin Urine: NEGATIVE
Glucose, UA: NEGATIVE mg/dL
Hgb urine dipstick: NEGATIVE
Ketones, ur: 5 mg/dL — AB
Leukocytes,Ua: NEGATIVE
Nitrite: NEGATIVE
Protein, ur: NEGATIVE mg/dL
Specific Gravity, Urine: 1.009 (ref 1.005–1.030)
pH: 8 (ref 5.0–8.0)

## 2021-03-06 LAB — BASIC METABOLIC PANEL
Anion gap: 8 (ref 5–15)
BUN: 6 mg/dL — ABNORMAL LOW (ref 8–23)
CO2: 23 mmol/L (ref 22–32)
Calcium: 8 mg/dL — ABNORMAL LOW (ref 8.9–10.3)
Chloride: 95 mmol/L — ABNORMAL LOW (ref 98–111)
Creatinine, Ser: 0.54 mg/dL (ref 0.44–1.00)
GFR, Estimated: >60 mL/min (ref >60)
Glucose, Bld: 102 mg/dL — ABNORMAL HIGH (ref 70–99)
Potassium: 3.6 mmol/L (ref 3.5–5.1)
Sodium: 126 mmol/L — ABNORMAL LOW (ref 135–145)

## 2021-03-06 LAB — LIPID PANEL
Cholesterol: 199 mg/dL (ref 0–200)
HDL: 46 mg/dL (ref >40)
LDL Cholesterol: 141 mg/dL — ABNORMAL HIGH (ref 0–99)
Total CHOL/HDL Ratio: 4.3 RATIO
Triglycerides: 62 mg/dL (ref <150)
VLDL: 12 mg/dL (ref 0–40)

## 2021-03-06 LAB — URIC ACID: Uric Acid, Serum: 2.7 mg/dL (ref 2.5–7.1)

## 2021-03-06 LAB — SODIUM, URINE, RANDOM: Sodium, Ur: 114 mmol/L

## 2021-03-06 LAB — HEMOGLOBIN A1C
Hgb A1c MFr Bld: 5.6 % (ref 4.8–5.6)
Mean Plasma Glucose: 114.02 mg/dL

## 2021-03-06 LAB — OSMOLALITY: Osmolality: 265 mOsm/kg — ABNORMAL LOW (ref 275–295)

## 2021-03-06 LAB — OSMOLALITY, URINE: Osmolality, Ur: 346 mOsm/kg (ref 300–900)

## 2021-03-06 LAB — ETHANOL: Alcohol, Ethyl (B): <10 mg/dL (ref <10)

## 2021-03-06 LAB — CREATININE, URINE, RANDOM: Creatinine, Urine: 31.45 mg/dL

## 2021-03-06 MED ORDER — POTASSIUM CHLORIDE CRYS ER 20 MEQ PO TBCR
20.0000 meq | EXTENDED_RELEASE_TABLET | Freq: Once | ORAL | Status: AC
  Administered 2021-03-06: 20 meq via ORAL
  Filled 2021-03-06: qty 1

## 2021-03-06 MED ORDER — ENOXAPARIN SODIUM 30 MG/0.3ML IJ SOSY
30.0000 mg | PREFILLED_SYRINGE | INTRAMUSCULAR | Status: DC
  Administered 2021-03-06 – 2021-03-13 (×8): 30 mg via SUBCUTANEOUS
  Filled 2021-03-06 (×8): qty 0.3

## 2021-03-06 MED ORDER — TAMSULOSIN HCL 0.4 MG PO CAPS
0.4000 mg | ORAL_CAPSULE | Freq: Every day | ORAL | Status: DC
  Administered 2021-03-06 – 2021-03-13 (×7): 0.4 mg via ORAL
  Filled 2021-03-06 (×8): qty 1

## 2021-03-06 MED ORDER — ROSUVASTATIN CALCIUM 5 MG PO TABS
10.0000 mg | ORAL_TABLET | Freq: Every day | ORAL | Status: DC
  Administered 2021-03-06: 10 mg via ORAL
  Filled 2021-03-06: qty 2

## 2021-03-06 MED ORDER — CHLORHEXIDINE GLUCONATE CLOTH 2 % EX PADS
6.0000 | MEDICATED_PAD | Freq: Every day | CUTANEOUS | Status: DC
  Administered 2021-03-08 – 2021-03-13 (×6): 6 via TOPICAL

## 2021-03-06 MED ORDER — ROSUVASTATIN CALCIUM 20 MG PO TABS
20.0000 mg | ORAL_TABLET | Freq: Every day | ORAL | Status: DC
  Administered 2021-03-07 – 2021-03-13 (×7): 20 mg via ORAL
  Filled 2021-03-06 (×7): qty 1

## 2021-03-06 MED ORDER — HYDRALAZINE HCL 20 MG/ML IJ SOLN: 10.0000 mg | Freq: Four times a day (QID) | INTRAMUSCULAR | Status: DC | PRN

## 2021-03-06 NOTE — Progress Notes (Signed)
   Cathy Silva is a 87 y.o. female patient admitted from ED, arrived to the unit at 55. Patient is awake, alert  & orientated  X 2,  Full Code, VSS - Blood pressure 133/60, pulse 84, temperature 98 F (36.7 C), temperature source Oral, resp. rate 19, last menstrual period 05/31/1987, SpO2 98 %., room air, no c/o shortness of breath, no c/o chest pain, no distress noted. Tele #  placed and pt is currently running:normal sinus rhythm.   IV site WDL:  forearm right, condition patent and no redness with a transparent dsg that's clean dry and intact.   Pt orientation to unit, room and routine. Information packet given to patient/family and safety video watched.  Admission INP armband ID verified with patient/family, and in place. SR up x 2, fall risk assessment complete with Patient and family verbalizing understanding of risks associated with falls. Pt verbalizes an understanding of how to use the call bell and to call for help before getting out of bed.  Skin, clean-dry- intact without ecchymosis noted on bilateral hands, no skin tears.   No evidence of skin break down noted on exam. Will cont to monitor and assist as needed.  Dorris Carnes, RN 03/06/2021 8:20 PM

## 2021-03-06 NOTE — Plan of Care (Signed)
  Problem: Ischemic Stroke/TIA Tissue Perfusion: Goal: Complications of ischemic stroke/TIA will be minimized Outcome: Progressing   

## 2021-03-06 NOTE — Evaluation (Signed)
Physical Therapy Evaluation Patient Details Name: Cathy Silva MRN: [id number] DOB: [birth year] Today's Date: 03/06/2021  History of Present Illness  Pt is an 87 y/o female admitted from SNF on 11/7 secondary to R arm weakness. Found to have L MCA infarct. PMH includes HTN.  Clinical Impression  Pt admitted secondary to problem above with deficits below. Noted increased weakness in RUE and mild R lateral lean in sitting Pt requiring mod A for bed mobility and mod A to stand. Pt reporting increased fatigue and was unable to take steps. Pt currently at SNF for rehab and recommend resuming SNF level therapies at d/c. Will continue to follow acutely.      Recommendations for follow up therapy are one component of a multi-disciplinary discharge planning process, led by the attending physician.  Recommendations may be updated based on patient status, additional functional criteria and insurance authorization.  Follow Up Recommendations Skilled nursing-short term rehab (<3 hours/day)    Assistance Recommended at Discharge Frequent or constant Supervision/Assistance  Functional Status Assessment Patient has had a recent decline in their functional status and demonstrates the ability to make significant improvements in function in a reasonable and predictable amount of time.  Equipment Recommendations  None recommended by PT    Recommendations for Other Services       Precautions / Restrictions Precautions Precautions: Fall Restrictions Weight Bearing Restrictions: No      Mobility  Bed Mobility Overal bed mobility: Needs Assistance Bed Mobility: Supine to Sit;Sit to Supine     Supine to sit: Mod assist Sit to supine: Mod assist   General bed mobility comments: Required assist for trunk and LE assist. Cues for sequencing.    Transfers Overall transfer level: Needs assistance Equipment used: 1 person hand held assist Transfers: Sit to/from Stand Sit to Stand: Mod assist            General transfer comment: stood X2 this session and required mod A for lift assist. Pt unable to take side steps at EOB.    Ambulation/Gait                  Stairs            Wheelchair Mobility    Modified Rankin (Stroke Patients Only) Modified Rankin (Stroke Patients Only) Pre-Morbid Rankin Score: Moderate disability Modified Rankin: Moderately severe disability     Balance Overall balance assessment: Needs assistance Sitting-balance support: No upper extremity supported;Feet supported Sitting balance-Leahy Scale: Poor Sitting balance - Comments: R lateral lean at times requiring min a for balance   Standing balance support: Single extremity supported Standing balance-Leahy Scale: Poor Standing balance comment: Reliant on LUE support and external support                             Pertinent Vitals/Pain Pain Assessment: No/denies pain    Home Living Family/patient expects to be discharged to:: Skilled nursing facility                        Prior Function Prior Level of Function : Needs assist       Physical Assist : ADLs (physical);Mobility (physical) Mobility (physical): Gait;Transfers;Bed mobility ADLs (physical): Dressing;Bathing Mobility Comments: Pt currently at SNF for rehab and was working on ambulation and transfers with PT ADLs Comments: Was supposed to have assist with ADLs, but niece unsure that staff were giving assist.     Hand Dominance  Extremity/Trunk Assessment   Upper Extremity Assessment Upper Extremity Assessment: Defer to OT evaluation (RUE with increased swelling and weakness. Limited grip strength. Elbow flexors/extensors at 1/5.)    Lower Extremity Assessment Lower Extremity Assessment: Generalized weakness    Cervical / Trunk Assessment Cervical / Trunk Assessment: Kyphotic  Communication   Communication: Expressive difficulties (some difficulty with word finding)  Cognition  Arousal/Alertness: Lethargic Behavior During Therapy: Flat affect Overall Cognitive Status: Impaired/Different from baseline Area of Impairment: Memory;Problem solving                     Memory: Decreased short-term memory       Problem Solving: Slow processing;Decreased initiation          General Comments      Exercises     Assessment/Plan    PT Assessment Patient needs continued PT services  PT Problem List Decreased range of motion;Decreased strength;Decreased activity tolerance;Decreased balance;Decreased mobility;Decreased coordination;Decreased knowledge of use of DME;Decreased safety awareness;Pain       PT Treatment Interventions DME instruction;Gait training;Stair training;Functional mobility training;Therapeutic activities;Therapeutic exercise;Balance training;Patient/family education    PT Goals (Current goals can be found in the Care Plan section)  Acute Rehab PT Goals Patient Stated Goal: to be able to walk PT Goal Formulation: With patient Time For Goal Achievement: 03/20/21 Potential to Achieve Goals: Fair    Frequency Min 3X/week   Barriers to discharge        Co-evaluation               AM-PAC PT "6 Clicks" Mobility  Outcome Measure Help needed turning from your back to your side while in a flat bed without using bedrails?: A Lot Help needed moving from lying on your back to sitting on the side of a flat bed without using bedrails?: A Lot Help needed moving to and from a bed to a chair (including a wheelchair)?: A Lot Help needed standing up from a chair using your arms (e.g., wheelchair or bedside chair)?: A Lot Help needed to walk in hospital room?: A Lot Help needed climbing 3-5 steps with a railing? : Total 6 Click Score: 11    End of Session Equipment Utilized During Treatment: Gait belt Activity Tolerance: Patient limited by fatigue Patient left: in bed (on bed in hallway in ED) Nurse Communication: Mobility status PT  Visit Diagnosis: Muscle weakness (generalized) (M62.81);Unsteadiness on feet (R26.81);Hemiplegia and hemiparesis Hemiplegia - Right/Left: Right Hemiplegia - caused by: Cerebral infarction    Time: 0569-7948 PT Time Calculation (min) (ACUTE ONLY): 20 min   Charges:   PT Evaluation $PT Eval Moderate Complexity: 1 Mod          Reuel Derby, PT, DPT  Acute Rehabilitation Services  Pager: [phone] Office: [phone]   Rudean Hitt 03/06/2021, 5:55 PM

## 2021-03-06 NOTE — Progress Notes (Addendum)
STROKE TEAM PROGRESS NOTE   INTERVAL HISTORY No one is at the bedside at time of this exam. She has bruises at both arms. She has marked right sided weakness.  MRI scan shows embolic left frontal MCA branch infarct.  MR angiogram of the brain shows no large vessel stenosis or occlusion.  Carotid ultrasound showed no significant extracranial stenosis.  Echocardiogram shows concentric left ventricular hypertrophy.  Patient denies any history of atrial fibrillation, palpitations or syncopal events.  Vitals:   03/05/21 2202 03/05/21 2220 03/06/21 0219 03/06/21 0639  BP: (!) 155/64 (!) 180/73 (!) 166/64 (!) 170/66  Pulse: 76 76 71 79  Resp: 16 16 16 16   Temp:  97.9 F (36.6 C)  98.7 F (37.1 C)  TempSrc:  Oral    SpO2: 99% 97% 97% 97%   CBC:  Recent Labs  Lab 03/05/21 1025 03/05/21 1039  WBC 9.5  --   NEUTROABS 6.7  --   HGB 11.4* 10.9*  HCT 33.1* 32.0*  MCV 81.7  --   PLT 369  --    Basic Metabolic Panel:  Recent Labs  Lab 03/05/21 1025 03/05/21 1039 03/05/21 1853 03/06/21 0258  NA 126* 127*  --  126*  K 3.5 3.7  --  3.6  CL 95* 94*  --  95*  CO2 23  --   --  23  GLUCOSE 119* 121*  --  102*  BUN 8 7*  --  6*  CREATININE 0.57 0.50  --  0.54  CALCIUM 8.2*  --   --  8.0*  MG  --   --  1.8  --     Lipid Panel:  Recent Labs  Lab 03/06/21 0258  CHOL 199  TRIG 62  HDL 46  CHOLHDL 4.3  VLDL 12  LDLCALC 141*    HgbA1c:  Recent Labs  Lab 03/06/21 0258  HGBA1C 5.6   Urine Drug Screen:  Recent Labs  Lab 03/05/21 1900  LABOPIA NONE DETECTED  COCAINSCRNUR NONE DETECTED  LABBENZ NONE DETECTED  AMPHETMU NONE DETECTED  THCU NONE DETECTED  LABBARB NONE DETECTED    Alcohol Level  Recent Labs  Lab 03/06/21 0258  ETH <10    IMAGING past 24 hours MR ANGIO HEAD WO CONTRAST  Result Date: 03/05/2021 CLINICAL DATA:  Initial evaluation for slurred speech, right arm weakness, stroke. EXAM: MRI HEAD WITHOUT CONTRAST MRA HEAD WITHOUT CONTRAST TECHNIQUE: Multiplanar,  multi-echo pulse sequences of the brain and surrounding structures were acquired without intravenous contrast. Angiographic images of the Circle of Willis were acquired using MRA technique without intravenous contrast. COMPARISON:  Prior head CT from earlier the same day. FINDINGS: MRI HEAD FINDINGS Brain: Mild diffuse prominence of the CSF containing spaces compatible generalized age-related cerebral atrophy. Patchy and confluent T2/FLAIR hyperintensity involving the periventricular deep white matter both cerebral hemispheres as well as the pons, most consistent with chronic small vessel ischemic disease, mild in nature. Patchy areas of restricted diffusion involving the cortical and subcortical aspect of the posterior left frontal and parietal lobes consistent with acute left MCA territory infarct (series 5, image 83). Mild patchy involvement of the left insula as well as the left pre and postcentral gyri. No associated hemorrhage or mass effect. There is an additional acute to early subacute ischemic infarct involving the right frontal corona radiata with extension towards the right insula (series 5, images 88, 83). Associated minimal petechial hemorrhage without frank hemorrhagic transformation or significant mass effect. Otherwise, gray-white matter differentiation maintained with  no other evidence for acute or subacute ischemia. No encephalomalacia to suggest chronic cortical infarction elsewhere within the brain. No other evidence for acute or chronic intracranial hemorrhage. No mass lesion, midline shift or mass effect. No hydrocephalus or extra-axial fluid collection. Pituitary gland suprasellar region within normal limits. Midline structures intact. Vascular: Major intracranial vascular flow voids are maintained. Skull and upper cervical spine: Craniocervical junction within normal limits. Bone marrow signal intensity normal. No scalp soft tissue abnormality. Sinuses/Orbits: Patient status post bilateral  ocular lens replacement. Globes and orbital soft tissues demonstrate no acute finding. Paranasal sinuses are largely clear. No mastoid effusion. Inner ear structures grossly normal. Other: None. MRA HEAD FINDINGS Anterior circulation: Visualized distal cervical segments of both internal carotid arteries patent with antegrade flow. Distal cervical right ICA tortuous. Petrous segments patent bilaterally. Moderate atheromatous irregularity throughout the carotid siphons without high-grade stenosis. Severe stenosis noted at the mid left A1 segment (series 1041, image 5). A1 segments otherwise patent. Normal anterior communicating artery complex. Right ACA irregular but patent to its distal aspect without stenosis. Short-segment severe left A2 stenosis (a series 5, image 143). Left ACA patent distally. Atheromatous irregularity within the M1 segments bilaterally without high-grade stenosis. Normal MCA bifurcations. Probable left M3 branch occlusion noted (series 5, image 164), in keeping with the acute left MCA distribution infarct. Moderate atheromatous change seen elsewhere throughout the distal MCA branches bilaterally. Posterior circulation: Both V4 segments patent to the vertebrobasilar junction without stenosis. Both PICA origins patent and normal. Basilar patent to its distal aspect without high-grade stenosis. Superior cerebellar arteries patent bilaterally. Both PCAs primarily supplied via the basilar. Atheromatous irregularity throughout both PCAs with associated moderate to severe bilateral distal P3/P4 stenoses. Anatomic variants: None significant.  No visible aneurysm. IMPRESSION: MRI HEAD IMPRESSION: 1. Patchy acute ischemic nonhemorrhagic left MCA territory infarct involving the posterior left frontoparietal region as above. No associated mass effect. 2. Additional acute to early subacute ischemic infarct involving the right frontal corona radiata. Associated minimal petechial hemorrhage without frank  hemorrhagic transformation or significant mass effect. 3. Underlying age-related cerebral atrophy with mild chronic small vessel ischemic disease. MRA HEAD IMPRESSION: 1. Left M3 branch occlusion, in keeping with the acute left MCA territory infarct. 2. Moderate intracranial atherosclerotic change elsewhere throughout the intracranial circulation as above. Additional notable findings include severe left A1 and A2 stenoses, with moderate to severe bilateral distal P3 and P4 stenoses. Electronically Signed   By: Jeannine Boga M.D.   On: 03/05/2021 22:58   MR BRAIN WO CONTRAST  Result Date: 03/05/2021 CLINICAL DATA:  Initial evaluation for slurred speech, right arm weakness, stroke. EXAM: MRI HEAD WITHOUT CONTRAST MRA HEAD WITHOUT CONTRAST TECHNIQUE: Multiplanar, multi-echo pulse sequences of the brain and surrounding structures were acquired without intravenous contrast. Angiographic images of the Circle of Willis were acquired using MRA technique without intravenous contrast. COMPARISON:  Prior head CT from earlier the same day. FINDINGS: MRI HEAD FINDINGS Brain: Mild diffuse prominence of the CSF containing spaces compatible generalized age-related cerebral atrophy. Patchy and confluent T2/FLAIR hyperintensity involving the periventricular deep white matter both cerebral hemispheres as well as the pons, most consistent with chronic small vessel ischemic disease, mild in nature. Patchy areas of restricted diffusion involving the cortical and subcortical aspect of the posterior left frontal and parietal lobes consistent with acute left MCA territory infarct (series 5, image 83). Mild patchy involvement of the left insula as well as the left pre and postcentral gyri. No associated hemorrhage or mass effect.  There is an additional acute to early subacute ischemic infarct involving the right frontal corona radiata with extension towards the right insula (series 5, images 88, 83). Associated minimal petechial  hemorrhage without frank hemorrhagic transformation or significant mass effect. Otherwise, gray-white matter differentiation maintained with no other evidence for acute or subacute ischemia. No encephalomalacia to suggest chronic cortical infarction elsewhere within the brain. No other evidence for acute or chronic intracranial hemorrhage. No mass lesion, midline shift or mass effect. No hydrocephalus or extra-axial fluid collection. Pituitary gland suprasellar region within normal limits. Midline structures intact. Vascular: Major intracranial vascular flow voids are maintained. Skull and upper cervical spine: Craniocervical junction within normal limits. Bone marrow signal intensity normal. No scalp soft tissue abnormality. Sinuses/Orbits: Patient status post bilateral ocular lens replacement. Globes and orbital soft tissues demonstrate no acute finding. Paranasal sinuses are largely clear. No mastoid effusion. Inner ear structures grossly normal. Other: None. MRA HEAD FINDINGS Anterior circulation: Visualized distal cervical segments of both internal carotid arteries patent with antegrade flow. Distal cervical right ICA tortuous. Petrous segments patent bilaterally. Moderate atheromatous irregularity throughout the carotid siphons without high-grade stenosis. Severe stenosis noted at the mid left A1 segment (series 1041, image 5). A1 segments otherwise patent. Normal anterior communicating artery complex. Right ACA irregular but patent to its distal aspect without stenosis. Short-segment severe left A2 stenosis (a series 5, image 143). Left ACA patent distally. Atheromatous irregularity within the M1 segments bilaterally without high-grade stenosis. Normal MCA bifurcations. Probable left M3 branch occlusion noted (series 5, image 164), in keeping with the acute left MCA distribution infarct. Moderate atheromatous change seen elsewhere throughout the distal MCA branches bilaterally. Posterior circulation: Both V4  segments patent to the vertebrobasilar junction without stenosis. Both PICA origins patent and normal. Basilar patent to its distal aspect without high-grade stenosis. Superior cerebellar arteries patent bilaterally. Both PCAs primarily supplied via the basilar. Atheromatous irregularity throughout both PCAs with associated moderate to severe bilateral distal P3/P4 stenoses. Anatomic variants: None significant.  No visible aneurysm. IMPRESSION: MRI HEAD IMPRESSION: 1. Patchy acute ischemic nonhemorrhagic left MCA territory infarct involving the posterior left frontoparietal region as above. No associated mass effect. 2. Additional acute to early subacute ischemic infarct involving the right frontal corona radiata. Associated minimal petechial hemorrhage without frank hemorrhagic transformation or significant mass effect. 3. Underlying age-related cerebral atrophy with mild chronic small vessel ischemic disease. MRA HEAD IMPRESSION: 1. Left M3 branch occlusion, in keeping with the acute left MCA territory infarct. 2. Moderate intracranial atherosclerotic change elsewhere throughout the intracranial circulation as above. Additional notable findings include severe left A1 and A2 stenoses, with moderate to severe bilateral distal P3 and P4 stenoses. Electronically Signed   By: Jeannine Boga M.D.   On: 03/05/2021 22:58   VAS US CAROTID  Result Date: 03/06/2021 Carotid Arterial Duplex Study Patient Name:  Cathy Silva  Date of Exam:   03/06/2021 Medical Rec #: [id number]           Accession #:    [id number] Date of Birth: [birth year]           Patient Gender: F Patient Age:   56 years Exam Location:  [facility] Procedure:      VAS US CAROTID Referring Phys: Alferd Patee [facility] --------------------------------------------------------------------------------  Indications:       CVA. Risk Factors:      Hyperlipidemia. Comparison Study:  No prior study Performing Technologist: Maudry Mayhew MHA, RDMS,  RVT, RDCS  Examination Guidelines: A complete evaluation  includes B-mode imaging, spectral Doppler, color Doppler, and power Doppler as needed of all accessible portions of each vessel. Bilateral testing is considered an integral part of a complete examination. Limited examinations for reoccurring indications may be performed as noted.  Right Carotid Findings: +---------+--------+-------+--------+---------------------------------+--------+          PSV cm/sEDV    StenosisPlaque Description               Comments                  cm/s                                                     +---------+--------+-------+--------+---------------------------------+--------+ CCA Prox 67      10                                                       +---------+--------+-------+--------+---------------------------------+--------+ CCA      76      9              smooth and heterogenous                   Distal                                                                    +---------+--------+-------+--------+---------------------------------+--------+ ICA Prox 183     34             heterogenous, irregular and                                               calcific                                  +---------+--------+-------+--------+---------------------------------+--------+ ICA      148     31                                                       Distal                                                                    +---------+--------+-------+--------+---------------------------------+--------+ ECA      70                     heterogenous and calcific                 +---------+--------+-------+--------+---------------------------------+--------+ +----------+--------+-------+----------------+-------------------+  PSV cm/sEDV cmsDescribe        Arm Pressure (mmHG) +----------+--------+-------+----------------+-------------------+  [id number]             Multiphasic, WNL                    +----------+--------+-------+----------------+-------------------+ +---------+--------+--+--------+--+---------+ VertebralPSV cm/s55EDV cm/s15Antegrade +---------+--------+--+--------+--+---------+  Left Carotid Findings: +----------+--------+-------+--------+--------------------------------+--------+           PSV cm/sEDV    StenosisPlaque Description              Comments                   cm/s                                                    +----------+--------+-------+--------+--------------------------------+--------+ CCA Prox  81      8              smooth and heterogenous                  +----------+--------+-------+--------+--------------------------------+--------+ CCA [id number]     8              smooth and heterogenous                  +----------+--------+-------+--------+--------------------------------+--------+ ICA Prox  88      13             smooth, heterogenous and                                                  calcific                                 +----------+--------+-------+--------+--------------------------------+--------+ ICA Distal86      19                                                      +----------+--------+-------+--------+--------------------------------+--------+ ECA       135                                                             +----------+--------+-------+--------+--------------------------------+--------+ +----------+--------+--------+----------------+-------------------+           PSV cm/sEDV cm/sDescribe        Arm Pressure (mmHG) +----------+--------+--------+----------------+-------------------+ Subclavian180             Multiphasic, WNL                    +----------+--------+--------+----------------+-------------------+ +---------+--------+--+--------+--+---------+ VertebralPSV cm/s43EDV cm/s10Antegrade  +---------+--------+--+--------+--+---------+   Summary: Right Carotid: Velocities in the right ICA are consistent with an upper range                1-39% stenosis. Left Carotid: Velocities in the left ICA are consistent with a 1-39% stenosis. Vertebrals:  Bilateral vertebral arteries demonstrate antegrade flow. Subclavians: Normal flow hemodynamics were seen in bilateral subclavian              arteries. *See table(s) above for measurements and observations.     Preliminary     PHYSICAL EXAM Frail cachectic looking elderly Caucasian lady not in distress.  She has multiple bruises in her forearms.  She is hard of hearing . Afebrile. Head is nontraumatic. Neck is supple without bruit.    Cardiac exam no murmur or gallop. Lungs are clear to auscultation. Distal pulses are well felt.   Mental status/Cognition: Alert, unable to answer orientation questions secondary to expressive aphasia. good attention.  Speech/language: Mildly nonfluent speech, word hesitancy.  Comprehension intact, able to name some but not all objects, unable to repeat. Cranial nerves:   CN II Pupils equal and reactive to light, no VF deficits    CN III,IV,VI EOM intact, no gaze preference or deviation, no nystagmus    CN V normal sensation in V1, V2, and V3 segments bilaterally    CN VII Mild R facial droop   CN VIII normal hearing to speech    CN IX & X normal palatal elevation, no uvular deviation    CN XI 5/5 head turn and 5/5 shoulder shrug bilaterally    CN XII midline tongue protrusion     Motor:  Muscle bulk: poor, tone normal, pronator right upper extremity drift yes RUE drift.  Significant right grip weakness with right wrist drop.  Tremor none Slight right hip flexor weakness. Coordination/Complex Motor:  - Finger to Nose intact on the left, no obvious ataxia in RUE thou - Heel to shin unable to do. - Rapid alternating movement are slowed - Gait: unsafe to attempt given stroke and a fall just last  week.  Sensation: intact throughout.    ASSESSMENT/PLAN Cathy Silva is a 87 y.o. female with history of  for hyperlipidemia, HTN, hyponatremia, mitral valve prolapse, who presents with right arm weakness, slurred speech and expressive aphasia.   Patient is unable to contribute significantly to the history taking process due to expressive aphasia.  Most of the history was  provided by patient's niece who was at the bedside.  Niece reports that last week patient was in the hospital for fall and diagnosed with a UTI. She was discharged to a SNF with plan for rehab before going back to home.  Niece reports that patient did fine and was working with PT on Friday and she herself witnessed patient's sign a check on Friday night.  Patient was also able to work with PT on Saturday morning.  In Saturday afternoon around 1500, she noted the patient was weaker on the right side and reported some numbness in her fingers.  Niece also reports the patient had trouble with language.  She notified staff and the staff reached out to the on-call doctors and did not seem concern. She was provided with cream for her hands. Sunday, she was still the same and on being evaluated on Monday morning, she was sent to the hospital for concern for stroke.   MRI brain obtained showed acute ischemic nonhemorrhagic left MCA territory infarct with subacute ischemic infarct involving the right frontal corona radiata.   MRA head:  Left M3 branch occlusion. Moderate intracranial atherosclerotic change elsewhere throughout the intracranial circulation as above. Additional notable findings include severe left A1 and A2 stenoses, with moderate to severe bilateral distal P3 and P4 stenoses   Left  MCA branch infarct as well as right periventricular white matter subcortical infarct both acute etiology likely cardioembolic strong suspicion for underlying paroxysmal A. fib is willing Code Stroke  CT head No acute abnormality.   Small vessel disease. Atrophy.  ASPECTS 10.     MRI BRAIN: Patchy acute ischemic nonhemorrhagic left MCA territory infarct involving the posterior left frontoparietal region as above. No associated mass effect. 2. Additional acute to early subacute ischemic infarct involving the right frontal corona radiata. Associated minimal petechial hemorrhage without frank hemorrhagic transformation or significant mass effect. 3. Underlying age-related cerebral atrophy with mild chronic small vessel ischemic disease. MRA  HEAD   1. Left M3 branch occlusion, in keeping with the acute left MCA territory infarct. 2. Moderate intracranial atherosclerotic change elsewhere throughout the intracranial circulation as above. Additional notable findings include severe left A1 and A2 stenoses, with moderate to severe bilateral distal P3 and P4 stenoses.   2D Echo EF 60-65%, normal sized LA, no IA shunts.  Carotid ultrasound bilateral 1-39% ICA stenosis LDL 141 HgbA1c 5.6 VTE prophylaxis - scd    Diet   Diet Heart Room service appropriate? Yes; Fluid consistency: Thin   No antithrombotic prior to admission, now on aspirin 81 mg daily.   Therapy recommendations:  pending Disposition:  pending  Hypertension Home meds:  amlodipine 5mg  daily, coreg 6.25 bid.  Stable Permissive hypertension (OK if < 220/120) but gradually normalize in 5-7 days Long-term BP goal normotensive  Hyperlipidemia Home meds:  none, crestor 10mg  daily started in hospital LDL 141, goal < 70  Increased to crestor 20mg   Continue statin at discharge  HgbA1c 5.6, goal < 7.0 CBGs No results for input(s): GLUCAP in the last 72 hours.  SSI  Other Stroke Risk Factors  Advanced Age >/= 25   Other Active Problems   Hyponatremia Mild decrease in sodium to 131.  IV fluid hydration with LR.  Recheck electrolytes and renal function in morning     Normocytic normochromic anemia Stable.  Recheck CBC in morning   Hospital day #  1 I have personally obtained history,examined this patient, reviewed notes, independently viewed imaging studies, participated in medical decision making and plan of care.ROS completed by me personally and pertinent positives fully documented  I have made any additions or clarifications directly to the above note. Agree with note above.  Presented with aphasia and right hemiparesis due to bilateral embolic infarcts strong suspicion for underlying A. fib.  Patient appears quite constitutionally frail and lives alone and may be high risk for falls and bleeding and she will not pursue loop recorder.  Continue ongoing stroke work-up and may do 30-day heart monitor at discharge.  Continue aspirin for stroke prevention along.  Mobilize out of bed.  Therapy consults.  Discussed with Dr. Candiss Norse.  Greater than 50% time during this 35-minute visit was spent in counseling and coordination of care and discussion with care team and answering questions.  Stroke team will sign off.  Kindly call for questions  Antony Contras, MD Medical Director [place] Pager: [phone] 03/06/2021 5:42 PM     To contact Stroke Continuity provider, please refer to [email or web address] After hours, contact General Neurology

## 2021-03-06 NOTE — Progress Notes (Signed)
Carotid artery duplex completed. Refer to "CV Proc" under chart review to view preliminary results.  03/06/2021 8:47 AM Kelby Aline., MHA, RVT, RDCS, RDMS

## 2021-03-06 NOTE — Progress Notes (Signed)
PROGRESS NOTE                                                                                                                                                                                                             Patient Demographics:    Cathy Silva, is a 87 y.o. female, DOB - [birth year], JEH:[id number]  Outpatient Primary MD for the patient is Koirala, Dibas, MD    LOS - 1  Admit date - 03/05/2021    Chief Complaint  Patient presents with   Weakness   Stroke Symptoms       Brief Narrative (HPI from H&P)   Cathy Silva is a 87 y.o. female with medical history significant for HTN, anemia, hyponatremia, recent admission for rhabdomyolysis after fall at home.  She was discharged last week to a skilled rehabilitation facility.  Her niece was visiting her every day on Saturday afternoon she noted that Ms. Artist was not acting normally and had slurred speech and right facial droop and she was not able to move or use her right arm or hand.     Subjective:    Cathy Silva today has, No headache, No chest pain, No abdominal pain - No Nausea, No new weakness tingling or numbness, no SOB.   Assessment  & Plan :     Acute metabolic encephalopathy with right-sided weakness and slurred speech due to acute CVA. - she has been seen by neurology team and full stroke work-up per neurology and stroke team.  Currently on aspirin, statin added for better LDL control, A1c stable.  Most likely will require SNF as she is elderly and lives alone.  Mentation is improving gradually.  2.  Dyslipidemia.  Placed on statin.  3.  Hyponatremia.  Appears dehydrated.  Gentle hydration with IV fluids, urine electrolytes ordered.  4.  Hypertension.  For now permissive hypertension due to stroke.  5.  Urinary outflow obstruction present on admission.  Came in with flow meter and Flomax.  Apparently she has had Foley for a number of  weeks.         Condition - Extremely Guarded  Family Communication  :  message left for friend Cathy Silva [phone] 03/06/21 at 10.30 am  Code Status :  Full  Consults  :  Neuro  PUD Prophylaxis : None   Procedures  :  TTE  Vas. US Neck - Non Acute  MRI/A- IMPRESSION: 1. Patchy acute ischemic nonhemorrhagic left MCA territory infarct involving the posterior left frontoparietal region as above. No associated mass effect. 2. Additional acute to early subacute ischemic infarct involving the right frontal corona radiata. Associated minimal petechial hemorrhage without frank hemorrhagic transformation or significant mass effect. 3. Underlying age-related cerebral atrophy with mild chronic small vessel ischemic disease. MRA HEAD IMPRESSION: 1. Left M3 branch occlusion, in keeping with the acute left MCA territory infarct. 2. Moderate intracranial atherosclerotic change elsewhere throughout the intracranial circulation as above. Additional notable findings include severe left A1 and A2 stenoses, with moderate to severe bilateral distal P3 and P4 stenoses      Disposition Plan  :    Status is: Inpatient  Remains inpatient appropriate because: CVA- needs SNF  DVT Prophylaxis  :    SCD's Start: 03/05/21 2225 Lovenox    Lab Results  Component Value Date   PLT 369 03/05/2021    Diet :  Diet Order             Diet Heart Room service appropriate? Yes; Fluid consistency: Thin  Diet effective now                    Inpatient Medications  Scheduled Meds:  aspirin  81 mg Oral Daily   Or   aspirin  300 mg Rectal Daily   feeding supplement  1 Container Oral Daily   multivitamin with minerals  1 tablet Oral Q breakfast   potassium chloride  20 mEq Oral Once   rosuvastatin  10 mg Oral Daily   tamsulosin  0.4 mg Oral Daily   Continuous Infusions:  lactated ringers 75 mL/hr at 03/06/21 0316   PRN Meds:.acetaminophen **OR** acetaminophen (TYLENOL) oral liquid 160 mg/5  mL **OR** acetaminophen, hydrALAZINE, senna-docusate  Antibiotics  :    Anti-infectives (From admission, onward)    None        Time Spent in minutes  30   Lala Lund M.D on 03/06/2021 at 10:29 AM  To page go to [email or web address]   [facility] -  Office  [phone]  See all Orders from today for further details    Objective:   Vitals:   03/05/21 2202 03/05/21 2220 03/06/21 0219 03/06/21 0639  BP: (!) 155/64 (!) 180/73 (!) 166/64 (!) 170/66  Pulse: 76 76 71 79  Resp: 16 16 16 16   Temp:  97.9 F (36.6 C)  98.7 F (37.1 C)  TempSrc:  Oral    SpO2: 99% 97% 97% 97%    Wt Readings from Last 3 Encounters:  03/05/21 68.8 kg  03/01/21 68.8 kg  02/24/21 63.5 kg    No intake or output data in the 24 hours ending 03/06/21 1029   Physical Exam  Awake Alert, x 1, mild R. Sided weakness, Foley in place Cathy Silva.AT,PERRAL Supple Neck, No JVD,   Symmetrical Chest wall movement, Good air movement bilaterally, CTAB RRR,No Gallops,Rubs or new Murmurs,  +ve B.Sounds, Abd Soft, No tenderness,   No Cyanosis, Clubbing or edema       Data Review:    CBC Recent Labs  Lab 03/05/21 1025 03/05/21 1039  WBC 9.5  --   HGB 11.4* 10.9*  HCT 33.1* 32.0*  PLT 369  --   MCV 81.7  --   MCH 28.1  --   MCHC 34.4  --   RDW 14.1  --   LYMPHSABS 1.3  --  MONOABS 1.4*  --   EOSABS 0.1  --   BASOSABS 0.1  --     Electrolytes Recent Labs  Lab 02/28/21 0119 03/05/21 1025 03/05/21 1039 03/05/21 1853 03/06/21 0258  NA 131* 126* 127*  --  126*  K 3.3* 3.5 3.7  --  3.6  CL 104 95* 94*  --  95*  CO2 22 23  --   --  23  GLUCOSE 102* 119* 121*  --  102*  BUN 11 8 7*  --  6*  CREATININE 0.63 0.57 0.50  --  0.54  CALCIUM 7.8* 8.2*  --   --  8.0*  AST  --  24  --   --   --   ALT  --  31  --   --   --   ALKPHOS  --  47  --   --   --   BILITOT  --  1.1  --   --   --   ALBUMIN  --  2.6*  --   --   --   MG  --   --   --  1.8  --   INR  --  1.0  --   --   --   TSH  --   --    --  2.867  --   HGBA1C  --   --   --   --  5.6    ------------------------------------------------------------------------------------------------------------------ Recent Labs    03/06/21 0258  CHOL 199  HDL 46  LDLCALC 141*  TRIG 62  CHOLHDL 4.3    Lab Results  Component Value Date   HGBA1C 5.6 03/06/2021    Recent Labs    03/05/21 1853  TSH 2.867   ------------------------------------------------------------------------------------------------------------------ ID Labs Recent Labs  Lab 02/28/21 0119 03/05/21 1025 03/05/21 1039 03/06/21 0258  WBC  --  9.5  --   --   PLT  --  369  --   --   CREATININE 0.63 0.57 0.50 0.54   Cardiac Enzymes No results for input(s): CKMB, TROPONINI, MYOGLOBIN in the last 168 hours.  Invalid input(s): CK   Radiology Reports CT HEAD WO CONTRAST  Result Date: 03/05/2021 CLINICAL DATA:  Slurred speech.  Right arm weakness. EXAM: CT HEAD WITHOUT CONTRAST TECHNIQUE: Contiguous axial images were obtained from the base of the skull through the vertex without intravenous contrast. COMPARISON:  02/24/2021 FINDINGS: Brain: Moderate low density in the periventricular white matter likely related to small vessel disease. Expected cerebral and cerebellar atrophy for age. Similar subtle hypoattenuation in the right insula likely due to remote infarct. No mass lesion, hemorrhage, hydrocephalus, acute infarct, intra-axial, or extra-axial fluid collection. Vascular: Intracranial atherosclerosis. No hyperdense vessel or unexpected calcification. Skull: Normal Sinuses/Orbits: Normal imaged portions of the orbits and globes. Clear paranasal sinuses and mastoid air cells. Other: None. IMPRESSION: No acute intracranial abnormality. Cerebral atrophy and small vessel ischemic change. Electronically Signed   By: Abigail Miyamoto M.D.   On: 03/05/2021 11:30   MR ANGIO HEAD WO CONTRAST  Result Date: 03/05/2021 CLINICAL DATA:  Initial evaluation for slurred speech,  right arm weakness, stroke. EXAM: MRI HEAD WITHOUT CONTRAST MRA HEAD WITHOUT CONTRAST TECHNIQUE: Multiplanar, multi-echo pulse sequences of the brain and surrounding structures were acquired without intravenous contrast. Angiographic images of the Circle of Willis were acquired using MRA technique without intravenous contrast. COMPARISON:  Prior head CT from earlier the same day. FINDINGS: MRI HEAD FINDINGS Brain: Mild diffuse prominence of the  CSF containing spaces compatible generalized age-related cerebral atrophy. Patchy and confluent T2/FLAIR hyperintensity involving the periventricular deep white matter both cerebral hemispheres as well as the pons, most consistent with chronic small vessel ischemic disease, mild in nature. Patchy areas of restricted diffusion involving the cortical and subcortical aspect of the posterior left frontal and parietal lobes consistent with acute left MCA territory infarct (series 5, image 83). Mild patchy involvement of the left insula as well as the left pre and postcentral gyri. No associated hemorrhage or mass effect. There is an additional acute to early subacute ischemic infarct involving the right frontal corona radiata with extension towards the right insula (series 5, images 88, 83). Associated minimal petechial hemorrhage without frank hemorrhagic transformation or significant mass effect. Otherwise, gray-white matter differentiation maintained with no other evidence for acute or subacute ischemia. No encephalomalacia to suggest chronic cortical infarction elsewhere within the brain. No other evidence for acute or chronic intracranial hemorrhage. No mass lesion, midline shift or mass effect. No hydrocephalus or extra-axial fluid collection. Pituitary gland suprasellar region within normal limits. Midline structures intact. Vascular: Major intracranial vascular flow voids are maintained. Skull and upper cervical spine: Craniocervical junction within normal limits. Bone  marrow signal intensity normal. No scalp soft tissue abnormality. Sinuses/Orbits: Patient status post bilateral ocular lens replacement. Globes and orbital soft tissues demonstrate no acute finding. Paranasal sinuses are largely clear. No mastoid effusion. Inner ear structures grossly normal. Other: None. MRA HEAD FINDINGS Anterior circulation: Visualized distal cervical segments of both internal carotid arteries patent with antegrade flow. Distal cervical right ICA tortuous. Petrous segments patent bilaterally. Moderate atheromatous irregularity throughout the carotid siphons without high-grade stenosis. Severe stenosis noted at the mid left A1 segment (series 1041, image 5). A1 segments otherwise patent. Normal anterior communicating artery complex. Right ACA irregular but patent to its distal aspect without stenosis. Short-segment severe left A2 stenosis (a series 5, image 143). Left ACA patent distally. Atheromatous irregularity within the M1 segments bilaterally without high-grade stenosis. Normal MCA bifurcations. Probable left M3 branch occlusion noted (series 5, image 164), in keeping with the acute left MCA distribution infarct. Moderate atheromatous change seen elsewhere throughout the distal MCA branches bilaterally. Posterior circulation: Both V4 segments patent to the vertebrobasilar junction without stenosis. Both PICA origins patent and normal. Basilar patent to its distal aspect without high-grade stenosis. Superior cerebellar arteries patent bilaterally. Both PCAs primarily supplied via the basilar. Atheromatous irregularity throughout both PCAs with associated moderate to severe bilateral distal P3/P4 stenoses. Anatomic variants: None significant.  No visible aneurysm. IMPRESSION: MRI HEAD IMPRESSION: 1. Patchy acute ischemic nonhemorrhagic left MCA territory infarct involving the posterior left frontoparietal region as above. No associated mass effect. 2. Additional acute to early subacute  ischemic infarct involving the right frontal corona radiata. Associated minimal petechial hemorrhage without frank hemorrhagic transformation or significant mass effect. 3. Underlying age-related cerebral atrophy with mild chronic small vessel ischemic disease. MRA HEAD IMPRESSION: 1. Left M3 branch occlusion, in keeping with the acute left MCA territory infarct. 2. Moderate intracranial atherosclerotic change elsewhere throughout the intracranial circulation as above. Additional notable findings include severe left A1 and A2 stenoses, with moderate to severe bilateral distal P3 and P4 stenoses. Electronically Signed   By: Jeannine Boga M.D.   On: 03/05/2021 22:58   MR BRAIN WO CONTRAST  Result Date: 03/05/2021 CLINICAL DATA:  Initial evaluation for slurred speech, right arm weakness, stroke. EXAM: MRI HEAD WITHOUT CONTRAST MRA HEAD WITHOUT CONTRAST TECHNIQUE: Multiplanar, multi-echo pulse sequences of  the brain and surrounding structures were acquired without intravenous contrast. Angiographic images of the Circle of Willis were acquired using MRA technique without intravenous contrast. COMPARISON:  Prior head CT from earlier the same day. FINDINGS: MRI HEAD FINDINGS Brain: Mild diffuse prominence of the CSF containing spaces compatible generalized age-related cerebral atrophy. Patchy and confluent T2/FLAIR hyperintensity involving the periventricular deep white matter both cerebral hemispheres as well as the pons, most consistent with chronic small vessel ischemic disease, mild in nature. Patchy areas of restricted diffusion involving the cortical and subcortical aspect of the posterior left frontal and parietal lobes consistent with acute left MCA territory infarct (series 5, image 83). Mild patchy involvement of the left insula as well as the left pre and postcentral gyri. No associated hemorrhage or mass effect. There is an additional acute to early subacute ischemic infarct involving the right  frontal corona radiata with extension towards the right insula (series 5, images 88, 83). Associated minimal petechial hemorrhage without frank hemorrhagic transformation or significant mass effect. Otherwise, gray-white matter differentiation maintained with no other evidence for acute or subacute ischemia. No encephalomalacia to suggest chronic cortical infarction elsewhere within the brain. No other evidence for acute or chronic intracranial hemorrhage. No mass lesion, midline shift or mass effect. No hydrocephalus or extra-axial fluid collection. Pituitary gland suprasellar region within normal limits. Midline structures intact. Vascular: Major intracranial vascular flow voids are maintained. Skull and upper cervical spine: Craniocervical junction within normal limits. Bone marrow signal intensity normal. No scalp soft tissue abnormality. Sinuses/Orbits: Patient status post bilateral ocular lens replacement. Globes and orbital soft tissues demonstrate no acute finding. Paranasal sinuses are largely clear. No mastoid effusion. Inner ear structures grossly normal. Other: None. MRA HEAD FINDINGS Anterior circulation: Visualized distal cervical segments of both internal carotid arteries patent with antegrade flow. Distal cervical right ICA tortuous. Petrous segments patent bilaterally. Moderate atheromatous irregularity throughout the carotid siphons without high-grade stenosis. Severe stenosis noted at the mid left A1 segment (series 1041, image 5). A1 segments otherwise patent. Normal anterior communicating artery complex. Right ACA irregular but patent to its distal aspect without stenosis. Short-segment severe left A2 stenosis (a series 5, image 143). Left ACA patent distally. Atheromatous irregularity within the M1 segments bilaterally without high-grade stenosis. Normal MCA bifurcations. Probable left M3 branch occlusion noted (series 5, image 164), in keeping with the acute left MCA distribution infarct.  Moderate atheromatous change seen elsewhere throughout the distal MCA branches bilaterally. Posterior circulation: Both V4 segments patent to the vertebrobasilar junction without stenosis. Both PICA origins patent and normal. Basilar patent to its distal aspect without high-grade stenosis. Superior cerebellar arteries patent bilaterally. Both PCAs primarily supplied via the basilar. Atheromatous irregularity throughout both PCAs with associated moderate to severe bilateral distal P3/P4 stenoses. Anatomic variants: None significant.  No visible aneurysm. IMPRESSION: MRI HEAD IMPRESSION: 1. Patchy acute ischemic nonhemorrhagic left MCA territory infarct involving the posterior left frontoparietal region as above. No associated mass effect. 2. Additional acute to early subacute ischemic infarct involving the right frontal corona radiata. Associated minimal petechial hemorrhage without frank hemorrhagic transformation or significant mass effect. 3. Underlying age-related cerebral atrophy with mild chronic small vessel ischemic disease. MRA HEAD IMPRESSION: 1. Left M3 branch occlusion, in keeping with the acute left MCA territory infarct. 2. Moderate intracranial atherosclerotic change elsewhere throughout the intracranial circulation as above. Additional notable findings include severe left A1 and A2 stenoses, with moderate to severe bilateral distal P3 and P4 stenoses. Electronically Signed   By: Marland Kitchen  Jeannine Boga M.D.   On: 03/05/2021 22:58   VAS US CAROTID  Result Date: 03/06/2021 Carotid Arterial Duplex Study Patient Name:  Cathy Silva  Date of Exam:   03/06/2021 Medical Rec #: [id number]           Accession #:    [id number] Date of Birth: 08/13/1929           Patient Gender: F Patient Age:   86 years Exam Location:  [facility] Procedure:      VAS US CAROTID Referring Phys: Alferd Patee [facility] --------------------------------------------------------------------------------  Indications:       CVA.  Risk Factors:      Hyperlipidemia. Comparison Study:  No prior study Performing Technologist: Maudry Mayhew MHA, RDMS, RVT, RDCS  Examination Guidelines: A complete evaluation includes B-mode imaging, spectral Doppler, color Doppler, and power Doppler as needed of all accessible portions of each vessel. Bilateral testing is considered an integral part of a complete examination. Limited examinations for reoccurring indications may be performed as noted.  Right Carotid Findings: +---------+--------+-------+--------+---------------------------------+--------+          PSV cm/sEDV    StenosisPlaque Description               Comments                  cm/s                                                     +---------+--------+-------+--------+---------------------------------+--------+ CCA Prox 67      10                                                       +---------+--------+-------+--------+---------------------------------+--------+ CCA      76      9              smooth and heterogenous                   Distal                                                                    +---------+--------+-------+--------+---------------------------------+--------+ ICA Prox 183     34             heterogenous, irregular and                                               calcific                                  +---------+--------+-------+--------+---------------------------------+--------+ ICA      148     31  Distal                                                                    +---------+--------+-------+--------+---------------------------------+--------+ ECA      70                     heterogenous and calcific                 +---------+--------+-------+--------+---------------------------------+--------+ +----------+--------+-------+----------------+-------------------+           PSV  cm/sEDV cmsDescribe        Arm Pressure (mmHG) +----------+--------+-------+----------------+-------------------+ [id number]             Multiphasic, WNL                    +----------+--------+-------+----------------+-------------------+ +---------+--------+--+--------+--+---------+ VertebralPSV cm/s55EDV cm/s15Antegrade +---------+--------+--+--------+--+---------+  Left Carotid Findings: +----------+--------+-------+--------+--------------------------------+--------+           PSV cm/sEDV    StenosisPlaque Description              Comments                   cm/s                                                    +----------+--------+-------+--------+--------------------------------+--------+ CCA Prox  81      8              smooth and heterogenous                  +----------+--------+-------+--------+--------------------------------+--------+ CCA [id number]     8              smooth and heterogenous                  +----------+--------+-------+--------+--------------------------------+--------+ ICA Prox  88      13             smooth, heterogenous and                                                  calcific                                 +----------+--------+-------+--------+--------------------------------+--------+ ICA Distal86      19                                                      +----------+--------+-------+--------+--------------------------------+--------+ ECA       135                                                             +----------+--------+-------+--------+--------------------------------+--------+ +----------+--------+--------+----------------+-------------------+  PSV cm/sEDV cm/sDescribe        Arm Pressure (mmHG) +----------+--------+--------+----------------+-------------------+ Subclavian180             Multiphasic, WNL                     +----------+--------+--------+----------------+-------------------+ +---------+--------+--+--------+--+---------+ VertebralPSV cm/s43EDV cm/s10Antegrade +---------+--------+--+--------+--+---------+   Summary: Right Carotid: Velocities in the right ICA are consistent with an upper range                1-39% stenosis. Left Carotid: Velocities in the left ICA are consistent with a 1-39% stenosis. Vertebrals:  Bilateral vertebral arteries demonstrate antegrade flow. Subclavians: Normal flow hemodynamics were seen in bilateral subclavian              arteries. *See table(s) above for measurements and observations.     Preliminary

## 2021-03-07 ENCOUNTER — Inpatient Hospital Stay (HOSPITAL_COMMUNITY): Payer: Medicare Other

## 2021-03-07 LAB — CBC WITH DIFFERENTIAL/PLATELET
Abs Immature Granulocytes: 0.05 10*3/uL (ref 0.00–0.07)
Basophils Absolute: 0.1 10*3/uL (ref 0.0–0.1)
Basophils Relative: 1 %
Eosinophils Absolute: 0.3 10*3/uL (ref 0.0–0.5)
Eosinophils Relative: 3 %
HCT: 31 % — ABNORMAL LOW (ref 36.0–46.0)
Hemoglobin: 10.6 g/dL — ABNORMAL LOW (ref 12.0–15.0)
Immature Granulocytes: 1 %
Lymphocytes Relative: 19 %
Lymphs Abs: 1.9 10*3/uL (ref 0.7–4.0)
MCH: 28 pg (ref 26.0–34.0)
MCHC: 34.2 g/dL (ref 30.0–36.0)
MCV: 81.8 fL (ref 80.0–100.0)
Monocytes Absolute: 1.2 10*3/uL — ABNORMAL HIGH (ref 0.1–1.0)
Monocytes Relative: 13 %
Neutro Abs: 6.4 10*3/uL (ref 1.7–7.7)
Neutrophils Relative %: 63 %
Platelets: 376 10*3/uL (ref 150–400)
RBC: 3.79 MIL/uL — ABNORMAL LOW (ref 3.87–5.11)
RDW: 14 % (ref 11.5–15.5)
WBC: 9.9 10*3/uL (ref 4.0–10.5)
nRBC: 0 % (ref 0.0–0.2)

## 2021-03-07 LAB — BRAIN NATRIURETIC PEPTIDE: B Natriuretic Peptide: 277.1 pg/mL — ABNORMAL HIGH (ref 0.0–100.0)

## 2021-03-07 LAB — COMPREHENSIVE METABOLIC PANEL
ALT: 23 U/L (ref 0–44)
AST: 17 U/L (ref 15–41)
Albumin: 2.4 g/dL — ABNORMAL LOW (ref 3.5–5.0)
Alkaline Phosphatase: 45 U/L (ref 38–126)
Anion gap: 6 (ref 5–15)
BUN: 8 mg/dL (ref 8–23)
CO2: 23 mmol/L (ref 22–32)
Calcium: 8 mg/dL — ABNORMAL LOW (ref 8.9–10.3)
Chloride: 96 mmol/L — ABNORMAL LOW (ref 98–111)
Creatinine, Ser: 0.54 mg/dL (ref 0.44–1.00)
GFR, Estimated: >60 mL/min (ref >60)
Glucose, Bld: 101 mg/dL — ABNORMAL HIGH (ref 70–99)
Potassium: 3.6 mmol/L (ref 3.5–5.1)
Sodium: 125 mmol/L — ABNORMAL LOW (ref 135–145)
Total Bilirubin: 0.9 mg/dL (ref 0.3–1.2)
Total Protein: 4.9 g/dL — ABNORMAL LOW (ref 6.5–8.1)

## 2021-03-07 LAB — MAGNESIUM: Magnesium: 1.6 mg/dL — ABNORMAL LOW (ref 1.7–2.4)

## 2021-03-07 LAB — UREA NITROGEN, URINE: Urea Nitrogen, Ur: 258 mg/dL

## 2021-03-07 MED ORDER — POTASSIUM CHLORIDE CRYS ER 20 MEQ PO TBCR
40.0000 meq | EXTENDED_RELEASE_TABLET | Freq: Once | ORAL | Status: AC
  Administered 2021-03-07: 40 meq via ORAL
  Filled 2021-03-07: qty 2

## 2021-03-07 MED ORDER — LACTATED RINGERS IV BOLUS
500.0000 mL | Freq: Once | INTRAVENOUS | Status: AC
  Administered 2021-03-07: 500 mL via INTRAVENOUS

## 2021-03-07 MED ORDER — MAGNESIUM SULFATE 2 GM/50ML IV SOLN
2.0000 g | Freq: Once | INTRAVENOUS | Status: AC
  Administered 2021-03-07: 2 g via INTRAVENOUS
  Filled 2021-03-07: qty 50

## 2021-03-07 MED ORDER — FUROSEMIDE 40 MG PO TABS
40.0000 mg | ORAL_TABLET | Freq: Once | ORAL | Status: AC
  Administered 2021-03-07: 40 mg via ORAL
  Filled 2021-03-07: qty 1

## 2021-03-07 NOTE — TOC Initial Note (Signed)
Transition of Care [facility]) - Initial/Assessment Note    Patient Details  Name: Cathy Silva MRN: [id number] Date of Birth: Oct 02, 1929  Transition of Care [facility]) CM/SW Contact:    Joanne Chars, LCSW Phone Number: 03/07/2021, 2:47 PM  Clinical Narrative:   CSW attempted to complete assessment with pt, however, she was confused, unable to provide information.  Pt did identify her niece [name] and gave permission for CSW to contact her.    CSW spoke with [name] by phone.  She confirmed that pt was admitted from [facility] but normally lives alone.  At baseline, pt is quite independent.  [name] was unhappy with the care provided at [facility] and is asking for another facility.                  Expected Discharge Plan: Skilled Nursing Facility Barriers to Discharge: Continued Medical Work up, SNF Pending bed offer   Patient Goals and CMS Choice   CMS Medicare.gov Compare Post Acute Care list provided to:: Patient Choice offered to / list presented to : Patient  Expected Discharge Plan and Services Expected Discharge Plan: [place] Choice: [place] arrangements for the past 2 months: Single Family Home                                      Prior Living Arrangements/Services Living arrangements for the past 2 months: Single Family Home Lives with:: Self Patient language and need for interpreter reviewed:: Yes        Need for Family Participation in Patient Care: Yes (Comment) Care giver support system in place?: Yes (comment) Current home services: Other (comment) (none) Criminal Activity/Legal Involvement Pertinent to Current Situation/Hospitalization: No - Comment as needed  Activities of Daily Living      Permission Sought/Granted Permission sought to share information with : Family Supports Permission granted to share information with : Yes, Verbal Permission Granted  Share Information  with NAME: niece, Jolyn Nap           Emotional Assessment Appearance:: Appears stated age Attitude/Demeanor/Rapport: Other (comment), Unable to Assess (confused) Affect (typically observed): Unable to Assess Orientation: : Oriented to Self, Oriented to Place Alcohol / Substance Use: Not Applicable Psych Involvement: No (comment)  Admission diagnosis:  Acute CVA (cerebrovascular accident) [facility]) [I63.9] Patient Active Problem List   Diagnosis Date Noted   CVA (cerebral vascular accident) ([place]) 03/05/2021   Acute CVA (cerebrovascular accident) ([place]) 03/05/2021   Acute urinary retention 03/02/2021   UTI (urinary tract infection) 03/02/2021   Elevated LFTs 03/02/2021   Elevated troponin 03/02/2021   Leukocytosis 03/02/2021   Normocytic normochromic anemia 03/02/2021   Rhabdomyolysis 02/24/2021   Nonrheumatic aortic valve stenosis 06/26/2020   Aortic atherosclerosis ([place]) 06/26/2020   Hyperlipidemia 06/26/2020   RBBB 06/26/2020   Hyponatremia 05/07/2015   Essential hypertension 05/07/2015   Nausea & vomiting 05/07/2015   Back pain 05/07/2015   Mid back pain    PCP:  [name], MD Pharmacy:   [facility], [place] [address] Phone: [phone] Fax: [phone]     Social Determinants of Health (SDOH) Interventions    Readmission Risk Interventions No flowsheet data found.

## 2021-03-07 NOTE — Progress Notes (Addendum)
PROGRESS NOTE                                                                                                                                                                                                             Patient Demographics:    Cathy Silva, is a 87 y.o. female, DOB - [birth year], YME:[id number]  Outpatient Primary MD for the patient is Koirala, Dibas, MD    LOS - 2  Admit date - 03/05/2021    Chief Complaint  Patient presents with   Weakness   Stroke Symptoms       Brief Narrative (HPI from H&P)   Cathy Silva is a 87 y.o. female with medical history significant for HTN, anemia, hyponatremia, recent admission for rhabdomyolysis after fall at home.  She was discharged last week to a skilled rehabilitation facility.  Her niece was visiting her every day on Saturday afternoon she noted that Cathy Silva was not acting normally and had slurred speech and right facial droop and she was not able to move or use her right arm or hand.     Subjective:   In bed but appears to be in no distress but has more weakness in the right arm, appears slightly confused and more dysarthric, unable to answer reliably or follow commands reliably.   Assessment  & Plan :     Acute metabolic encephalopathy with right-sided weakness and slurred speech due to acute CVA. - she has been seen by neurology team and full stroke work-up per neurology and stroke team.  Currently on aspirin, statin added for better LDL control, A1c stable.  Clinically CVA has evolved further on 03/07/2021 at 8 AM as noted by repeat CT, case discussed with neurology team, poor prognosis continue present line of care, most likely will require SNF as she is elderly and lives alone.    2.  Dyslipidemia.  Placed on statin.  3.  Hyponatremia.  SIADH, worse after IV fluids, free water restriction, gentle Lasix if tolerated.  4.  Hypertension.  For now  permissive hypertension due to stroke.  5.  Urinary outflow obstruction present on admission.  Came in with Foley catheter along with Flomax.  Apparently she has had Foley for a number of weeks.  She will require urology follow-up outpatient post discharge  Condition - Extremely Guarded  Family Communication  :  message left for friend [name] [phone] 03/06/21 at 10.30 am, message left again on 03/07/2021 at 8:15 AM, called again and left a message on 03/07/2021 at 12:16 PM  Code Status :  Full  Consults  :  Neuro  PUD Prophylaxis : None   Procedures  :     TTE -   Vas. US Neck - Non Acute  MRI/A- IMPRESSION: 1. Patchy acute ischemic nonhemorrhagic left MCA territory infarct involving the posterior left frontoparietal region as above. No associated mass effect. 2. Additional acute to early subacute ischemic infarct involving the right frontal corona radiata. Associated minimal petechial hemorrhage without frank hemorrhagic transformation or significant mass effect. 3. Underlying age-related cerebral atrophy with mild chronic small vessel ischemic disease. MRA HEAD IMPRESSION: 1. Left M3 branch occlusion, in keeping with the acute left MCA territory infarct. 2. Moderate intracranial atherosclerotic change elsewhere throughout the intracranial circulation as above. Additional notable findings include severe left A1 and A2 stenoses, with moderate to severe bilateral distal P3 and P4 stenoses      Disposition Plan  :    Status is: Inpatient  Remains inpatient appropriate because: CVA- needs SNF  DVT Prophylaxis  :    enoxaparin (LOVENOX) injection 30 mg Start: 03/06/21 1200 SCD's Start: 03/05/21 2225 Lovenox    Lab Results  Component Value Date   PLT 376 03/07/2021    Diet :  Diet Order             Diet Heart Room service appropriate? Yes; Fluid consistency: Thin; Fluid restriction: 1200 mL Fluid  Diet effective now                    Inpatient  Medications  Scheduled Meds:  aspirin  81 mg Oral Daily   Or   aspirin  300 mg Rectal Daily   Chlorhexidine Gluconate Cloth  6 each Topical Daily   enoxaparin (LOVENOX) injection  30 mg Subcutaneous Q24H   feeding supplement  1 Container Oral Daily   multivitamin with minerals  1 tablet Oral Q breakfast   rosuvastatin  20 mg Oral Daily   tamsulosin  0.4 mg Oral Daily   Continuous Infusions:  lactated ringers     PRN Meds:.acetaminophen **OR** acetaminophen (TYLENOL) oral liquid 160 mg/5 mL **OR** acetaminophen, hydrALAZINE, senna-docusate  Antibiotics  :    Anti-infectives (From admission, onward)    None        Time Spent in minutes  30   Lala Lund M.D on 03/07/2021 at 10:29 AM  To page go to [email or web address]   [facility] -  Office  [phone]  See all Orders from today for further details    Objective:   Vitals:   03/06/21 2024 03/06/21 2318 03/07/21 0322 03/07/21 0750  BP: (!) 147/66 (!) 158/67 (!) 159/61 (!) 118/54  Pulse: 81 84 84 92  Resp: 20 19 18 14   Temp: 98.3 F (36.8 C) 98.3 F (36.8 C) 97.8 F (36.6 C) 97.8 F (36.6 C)  TempSrc: Oral Oral Oral Oral  SpO2: 96% 97% 96% 97%    Wt Readings from Last 3 Encounters:  03/05/21 68.8 kg  03/01/21 68.8 kg  02/24/21 63.5 kg     Intake/Output Summary (Last 24 hours) at 03/07/2021 1029 Last data filed at 03/07/2021 0700 Gross per 24 hour  Intake 2237.86 ml  Output 3250 ml  Net -1012.14 ml     Physical Exam  Awake ,  dysarthria, ++ R. Arm >> leg weakness, Foley in place [place].AT,PERRAL Supple Neck, No JVD,   Symmetrical Chest wall movement, Good air movement bilaterally, CTAB RRR,No Gallops,Rubs or new Murmurs,  +ve B.Sounds, Abd Soft, No tenderness,   No Cyanosis, Clubbing or edema       Data Review:    CBC Recent Labs  Lab 03/05/21 1025 03/05/21 1039 03/07/21 0329  WBC 9.5  --  9.9  HGB 11.4* 10.9* 10.6*  HCT 33.1* 32.0* 31.0*  PLT 369  --  376  MCV 81.7  --  81.8   MCH 28.1  --  28.0  MCHC 34.4  --  34.2  RDW 14.1  --  14.0  LYMPHSABS 1.3  --  1.9  MONOABS 1.4*  --  1.2*  EOSABS 0.1  --  0.3  BASOSABS 0.1  --  0.1    Electrolytes Recent Labs  Lab 03/05/21 1025 03/05/21 1039 03/05/21 1853 03/06/21 0258 03/07/21 0329  NA 126* 127*  --  126* 125*  K 3.5 3.7  --  3.6 3.6  CL 95* 94*  --  95* 96*  CO2 23  --   --  23 23  GLUCOSE 119* 121*  --  102* 101*  BUN 8 7*  --  6* 8  CREATININE 0.57 0.50  --  0.54 0.54  CALCIUM 8.2*  --   --  8.0* 8.0*  AST 24  --   --   --  17  ALT 31  --   --   --  23  ALKPHOS 47  --   --   --  45  BILITOT 1.1  --   --   --  0.9  ALBUMIN 2.6*  --   --   --  2.4*  MG  --   --  1.8  --  1.6*  INR 1.0  --   --   --   --   TSH  --   --  2.867  --   --   HGBA1C  --   --   --  5.6  --   BNP  --   --   --   --  277.1*    ------------------------------------------------------------------------------------------------------------------ Recent Labs    03/06/21 0258  CHOL 199  HDL 46  LDLCALC 141*  TRIG 62  CHOLHDL 4.3    Lab Results  Component Value Date   HGBA1C 5.6 03/06/2021    Recent Labs    03/05/21 1853  TSH 2.867     Radiology Reports CT HEAD WO CONTRAST (5MM)  Result Date: 03/07/2021 CLINICAL DATA:  Stroke, follow up EXAM: CT HEAD WITHOUT CONTRAST TECHNIQUE: Contiguous axial images were obtained from the base of the skull through the vertex without intravenous contrast. COMPARISON:  MRI 03/05/2021, head CT 03/05/2021 FINDINGS: Brain: There is mild loss of gray-white matter differentiation in the left MCA territory in the posterior left frontal and the parietal lobe consistent with infarct, as seen on MRI. Hypoattenuation in the right frontal corona radiata extending towards the insula also consistent with infarct. These findings are increased in conspicuity in comparison to prior CT.No concerning mass effect. No acute intracranial hemorrhage. The basal cisterns are patent.The ventricles are  unchanged in size.Scattered subcortical and periventricular white matter hypodensities, nonspecific but likely sequela of chronic small vessel ischemic disease.Mild cerebral atrophy Vascular: Vertebral artery calcifications. Skull: Normal. Negative for fracture or focal lesion. Sinuses/Orbits: No acute finding. Other: None. IMPRESSION: Evolving left MCA territory  infarct involving the posterior left frontoparietal region and additional infarct in the right frontal corona radiata. No concerning mass effect. No acute intracranial hemorrhage. Underlying cerebral atrophy with unchanged mild sequela of chronic small vessel ischemic disease. Electronically Signed   By: Maurine Simmering M.D.   On: 03/07/2021 09:18   CT HEAD WO CONTRAST  Result Date: 03/05/2021 CLINICAL DATA:  Slurred speech.  Right arm weakness. EXAM: CT HEAD WITHOUT CONTRAST TECHNIQUE: Contiguous axial images were obtained from the base of the skull through the vertex without intravenous contrast. COMPARISON:  02/24/2021 FINDINGS: Brain: Moderate low density in the periventricular white matter likely related to small vessel disease. Expected cerebral and cerebellar atrophy for age. Similar subtle hypoattenuation in the right insula likely due to remote infarct. No mass lesion, hemorrhage, hydrocephalus, acute infarct, intra-axial, or extra-axial fluid collection. Vascular: Intracranial atherosclerosis. No hyperdense vessel or unexpected calcification. Skull: Normal Sinuses/Orbits: Normal imaged portions of the orbits and globes. Clear paranasal sinuses and mastoid air cells. Other: None. IMPRESSION: No acute intracranial abnormality. Cerebral atrophy and small vessel ischemic change. Electronically Signed   By: Abigail Miyamoto M.D.   On: 03/05/2021 11:30   MR ANGIO HEAD WO CONTRAST  Result Date: 03/05/2021 CLINICAL DATA:  Initial evaluation for slurred speech, right arm weakness, stroke. EXAM: MRI HEAD WITHOUT CONTRAST MRA HEAD WITHOUT CONTRAST TECHNIQUE:  Multiplanar, multi-echo pulse sequences of the brain and surrounding structures were acquired without intravenous contrast. Angiographic images of the Circle of Willis were acquired using MRA technique without intravenous contrast. COMPARISON:  Prior head CT from earlier the same day. FINDINGS: MRI HEAD FINDINGS Brain: Mild diffuse prominence of the CSF containing spaces compatible generalized age-related cerebral atrophy. Patchy and confluent T2/FLAIR hyperintensity involving the periventricular deep white matter both cerebral hemispheres as well as the pons, most consistent with chronic small vessel ischemic disease, mild in nature. Patchy areas of restricted diffusion involving the cortical and subcortical aspect of the posterior left frontal and parietal lobes consistent with acute left MCA territory infarct (series 5, image 83). Mild patchy involvement of the left insula as well as the left pre and postcentral gyri. No associated hemorrhage or mass effect. There is an additional acute to early subacute ischemic infarct involving the right frontal corona radiata with extension towards the right insula (series 5, images 88, 83). Associated minimal petechial hemorrhage without frank hemorrhagic transformation or significant mass effect. Otherwise, gray-white matter differentiation maintained with no other evidence for acute or subacute ischemia. No encephalomalacia to suggest chronic cortical infarction elsewhere within the brain. No other evidence for acute or chronic intracranial hemorrhage. No mass lesion, midline shift or mass effect. No hydrocephalus or extra-axial fluid collection. Pituitary gland suprasellar region within normal limits. Midline structures intact. Vascular: Major intracranial vascular flow voids are maintained. Skull and upper cervical spine: Craniocervical junction within normal limits. Bone marrow signal intensity normal. No scalp soft tissue abnormality. Sinuses/Orbits: Patient status  post bilateral ocular lens replacement. Globes and orbital soft tissues demonstrate no acute finding. Paranasal sinuses are largely clear. No mastoid effusion. Inner ear structures grossly normal. Other: None. MRA HEAD FINDINGS Anterior circulation: Visualized distal cervical segments of both internal carotid arteries patent with antegrade flow. Distal cervical right ICA tortuous. Petrous segments patent bilaterally. Moderate atheromatous irregularity throughout the carotid siphons without high-grade stenosis. Severe stenosis noted at the mid left A1 segment (series 1041, image 5). A1 segments otherwise patent. Normal anterior communicating artery complex. Right ACA irregular but patent to its distal aspect without stenosis.  Short-segment severe left A2 stenosis (a series 5, image 143). Left ACA patent distally. Atheromatous irregularity within the M1 segments bilaterally without high-grade stenosis. Normal MCA bifurcations. Probable left M3 branch occlusion noted (series 5, image 164), in keeping with the acute left MCA distribution infarct. Moderate atheromatous change seen elsewhere throughout the distal MCA branches bilaterally. Posterior circulation: Both V4 segments patent to the vertebrobasilar junction without stenosis. Both PICA origins patent and normal. Basilar patent to its distal aspect without high-grade stenosis. Superior cerebellar arteries patent bilaterally. Both PCAs primarily supplied via the basilar. Atheromatous irregularity throughout both PCAs with associated moderate to severe bilateral distal P3/P4 stenoses. Anatomic variants: None significant.  No visible aneurysm. IMPRESSION: MRI HEAD IMPRESSION: 1. Patchy acute ischemic nonhemorrhagic left MCA territory infarct involving the posterior left frontoparietal region as above. No associated mass effect. 2. Additional acute to early subacute ischemic infarct involving the right frontal corona radiata. Associated minimal petechial hemorrhage  without frank hemorrhagic transformation or significant mass effect. 3. Underlying age-related cerebral atrophy with mild chronic small vessel ischemic disease. MRA HEAD IMPRESSION: 1. Left M3 branch occlusion, in keeping with the acute left MCA territory infarct. 2. Moderate intracranial atherosclerotic change elsewhere throughout the intracranial circulation as above. Additional notable findings include severe left A1 and A2 stenoses, with moderate to severe bilateral distal P3 and P4 stenoses. Electronically Signed   By: Jeannine Boga M.D.   On: 03/05/2021 22:58   MR BRAIN WO CONTRAST  Result Date: 03/05/2021 CLINICAL DATA:  Initial evaluation for slurred speech, right arm weakness, stroke. EXAM: MRI HEAD WITHOUT CONTRAST MRA HEAD WITHOUT CONTRAST TECHNIQUE: Multiplanar, multi-echo pulse sequences of the brain and surrounding structures were acquired without intravenous contrast. Angiographic images of the Circle of Willis were acquired using MRA technique without intravenous contrast. COMPARISON:  Prior head CT from earlier the same day. FINDINGS: MRI HEAD FINDINGS Brain: Mild diffuse prominence of the CSF containing spaces compatible generalized age-related cerebral atrophy. Patchy and confluent T2/FLAIR hyperintensity involving the periventricular deep white matter both cerebral hemispheres as well as the pons, most consistent with chronic small vessel ischemic disease, mild in nature. Patchy areas of restricted diffusion involving the cortical and subcortical aspect of the posterior left frontal and parietal lobes consistent with acute left MCA territory infarct (series 5, image 83). Mild patchy involvement of the left insula as well as the left pre and postcentral gyri. No associated hemorrhage or mass effect. There is an additional acute to early subacute ischemic infarct involving the right frontal corona radiata with extension towards the right insula (series 5, images 88, 83). Associated  minimal petechial hemorrhage without frank hemorrhagic transformation or significant mass effect. Otherwise, gray-white matter differentiation maintained with no other evidence for acute or subacute ischemia. No encephalomalacia to suggest chronic cortical infarction elsewhere within the brain. No other evidence for acute or chronic intracranial hemorrhage. No mass lesion, midline shift or mass effect. No hydrocephalus or extra-axial fluid collection. Pituitary gland suprasellar region within normal limits. Midline structures intact. Vascular: Major intracranial vascular flow voids are maintained. Skull and upper cervical spine: Craniocervical junction within normal limits. Bone marrow signal intensity normal. No scalp soft tissue abnormality. Sinuses/Orbits: Patient status post bilateral ocular lens replacement. Globes and orbital soft tissues demonstrate no acute finding. Paranasal sinuses are largely clear. No mastoid effusion. Inner ear structures grossly normal. Other: None. MRA HEAD FINDINGS Anterior circulation: Visualized distal cervical segments of both internal carotid arteries patent with antegrade flow. Distal cervical right ICA tortuous. Petrous segments  patent bilaterally. Moderate atheromatous irregularity throughout the carotid siphons without high-grade stenosis. Severe stenosis noted at the mid left A1 segment (series 1041, image 5). A1 segments otherwise patent. Normal anterior communicating artery complex. Right ACA irregular but patent to its distal aspect without stenosis. Short-segment severe left A2 stenosis (a series 5, image 143). Left ACA patent distally. Atheromatous irregularity within the M1 segments bilaterally without high-grade stenosis. Normal MCA bifurcations. Probable left M3 branch occlusion noted (series 5, image 164), in keeping with the acute left MCA distribution infarct. Moderate atheromatous change seen elsewhere throughout the distal MCA branches bilaterally. Posterior  circulation: Both V4 segments patent to the vertebrobasilar junction without stenosis. Both PICA origins patent and normal. Basilar patent to its distal aspect without high-grade stenosis. Superior cerebellar arteries patent bilaterally. Both PCAs primarily supplied via the basilar. Atheromatous irregularity throughout both PCAs with associated moderate to severe bilateral distal P3/P4 stenoses. Anatomic variants: None significant.  No visible aneurysm. IMPRESSION: MRI HEAD IMPRESSION: 1. Patchy acute ischemic nonhemorrhagic left MCA territory infarct involving the posterior left frontoparietal region as above. No associated mass effect. 2. Additional acute to early subacute ischemic infarct involving the right frontal corona radiata. Associated minimal petechial hemorrhage without frank hemorrhagic transformation or significant mass effect. 3. Underlying age-related cerebral atrophy with mild chronic small vessel ischemic disease. MRA HEAD IMPRESSION: 1. Left M3 branch occlusion, in keeping with the acute left MCA territory infarct. 2. Moderate intracranial atherosclerotic change elsewhere throughout the intracranial circulation as above. Additional notable findings include severe left A1 and A2 stenoses, with moderate to severe bilateral distal P3 and P4 stenoses. Electronically Signed   By: Jeannine Boga M.D.   On: 03/05/2021 22:58   VAS US CAROTID  Result Date: 03/06/2021 Carotid Arterial Duplex Study Patient Name:  DONALD JACQUE  Date of Exam:   03/06/2021 Medical Rec #: [id number]           Accession #:    [id number] Date of Birth: 06/24/1929           Patient Gender: F Patient Age:   18 years Exam Location:  [facility] Procedure:      VAS US CAROTID Referring Phys: Alferd Patee [facility] --------------------------------------------------------------------------------  Indications:       CVA. Risk Factors:      Hyperlipidemia. Comparison Study:  No prior study Performing Technologist: Maudry Mayhew MHA, RDMS, RVT, RDCS  Examination Guidelines: A complete evaluation includes B-mode imaging, spectral Doppler, color Doppler, and power Doppler as needed of all accessible portions of each vessel. Bilateral testing is considered an integral part of a complete examination. Limited examinations for reoccurring indications may be performed as noted.  Right Carotid Findings: +---------+--------+-------+--------+---------------------------------+--------+          PSV cm/sEDV    StenosisPlaque Description               Comments                  cm/s                                                     +---------+--------+-------+--------+---------------------------------+--------+ CCA Prox 67      10                                                       +---------+--------+-------+--------+---------------------------------+--------+  CCA      76      9              smooth and heterogenous                   Distal                                                                    +---------+--------+-------+--------+---------------------------------+--------+ ICA Prox 183     34             heterogenous, irregular and                                               calcific                                  +---------+--------+-------+--------+---------------------------------+--------+ ICA      148     31                                                       Distal                                                                    +---------+--------+-------+--------+---------------------------------+--------+ ECA      70                     heterogenous and calcific                 +---------+--------+-------+--------+---------------------------------+--------+ +----------+--------+-------+----------------+-------------------+           PSV cm/sEDV cmsDescribe        Arm Pressure (mmHG)  +----------+--------+-------+----------------+-------------------+ [id number]             Multiphasic, WNL                    +----------+--------+-------+----------------+-------------------+ +---------+--------+--+--------+--+---------+ VertebralPSV cm/s55EDV cm/s15Antegrade +---------+--------+--+--------+--+---------+  Left Carotid Findings: +----------+--------+-------+--------+--------------------------------+--------+           PSV cm/sEDV    StenosisPlaque Description              Comments                   cm/s                                                    +----------+--------+-------+--------+--------------------------------+--------+ CCA Prox  81      8              smooth and  heterogenous                  +----------+--------+-------+--------+--------------------------------+--------+ CCA [id number]     8              smooth and heterogenous                  +----------+--------+-------+--------+--------------------------------+--------+ ICA Prox  88      13             smooth, heterogenous and                                                  calcific                                 +----------+--------+-------+--------+--------------------------------+--------+ ICA Distal86      19                                                      +----------+--------+-------+--------+--------------------------------+--------+ ECA       135                                                             +----------+--------+-------+--------+--------------------------------+--------+ +----------+--------+--------+----------------+-------------------+           PSV cm/sEDV cm/sDescribe        Arm Pressure (mmHG) +----------+--------+--------+----------------+-------------------+ Subclavian180             Multiphasic, WNL                    +----------+--------+--------+----------------+-------------------+  +---------+--------+--+--------+--+---------+ VertebralPSV cm/s43EDV cm/s10Antegrade +---------+--------+--+--------+--+---------+   Summary: Right Carotid: Velocities in the right ICA are consistent with an upper range                1-39% stenosis. Left Carotid: Velocities in the left ICA are consistent with a 1-39% stenosis. Vertebrals:  Bilateral vertebral arteries demonstrate antegrade flow. Subclavians: Normal flow hemodynamics were seen in bilateral subclavian              arteries. *See table(s) above for measurements and observations.  Electronically signed by Antony Contras MD on 03/06/2021 at 5:53:07 PM.    Final

## 2021-03-07 NOTE — Progress Notes (Addendum)
Physical Therapy Treatment Patient Details Name: Cathy Silva MRN: [id number] DOB: [birth year] Today's Date: 03/07/2021   History of Present Illness Pt is an 87 y/o female admitted from SNF on 11/7 secondary to R arm weakness. Found to have L MCA infarct. PMH includes HTN.    PT Comments    Pt received in chair and agreeable to session to transfer back to bed after being in chair >1 hr, RN unavailable to assist pt at time of session so PTA called in to room to assist. Rollene Rotunda with up to [place] for RUE management, power-up, and steadying. Pt able to flex R elbow to reach Stedy bar but unable to maintain R hand grasp on bar, needs hand over hand assist to maintain. Continue to recommend SNF upon DC. Pt continues to benefit from PT services to progress toward functional mobility goals.       Recommendations for follow up therapy are one component of a multi-disciplinary discharge planning process, led by the attending physician.  Recommendations may be updated based on patient status, additional functional criteria and insurance authorization.  Follow Up Recommendations  Skilled nursing-short term rehab (<3 hours/day)     Assistance Recommended at Discharge Frequent or constant Supervision/Assistance  Equipment Recommendations  None recommended by PT (Defer to post acute)    Recommendations for Other Services       Precautions / Restrictions Precautions Precautions: Fall Precaution Comments: RUE/LE hemiplegia and decreased sensation, HoH Restrictions Weight Bearing Restrictions: No     Mobility  Bed Mobility Overal bed mobility: Needs Assistance Bed Mobility: Sit to Supine    Sit to supine: Min assist  General bed mobility comments: Pt needs assist to scoot and for BLE management    Transfers Overall transfer level: Needs assistance Equipment used: Rolling walker (2 wheels) Transfers: Sit to/from Stand Sit to Stand: Mod assist;+2 physical assistance Stand  pivot transfers: TotalA using [employer] transfer comment: From chair to EOB, utilized stedy with up to modA +2 for power up, steadying, and hand-over-hand for safe R hand placement Transfer via Lift Equipment: Stedy   Modified Rankin (Stroke Patients Only) Modified Rankin (Stroke Patients Only) Pre-Morbid Rankin Score: Moderate disability Modified Rankin: Severe disability     Balance Overall balance assessment: Needs assistance Sitting-balance support: No upper extremity supported;Feet supported Sitting balance-Leahy Scale: Poor Sitting balance - Comments: R lateral lean needs cues for midline posture Postural control: Right lateral lean Standing balance support: Single extremity supported Standing balance-Leahy Scale: Poor Standing balance comment: Reliant on LUE support and external support, heavy R lean        Cognition Arousal/Alertness: Awake/alert Behavior During Therapy: WFL for tasks assessed/performed Overall Cognitive Status: Impaired/Different from baseline Area of Impairment: Memory;Problem solving     Memory: Decreased short-term memory       Problem Solving: Slow processing;Decreased initiation;Requires verbal cues General Comments: Pt calm/cooperative during transfer and able to make her needs known with increased time.           General Comments General comments (skin integrity, edema, etc.): PTA called back to room to assist with return transfer from chair to bed as nurse actively assisting another pt and not available at time of transfer, pt c/o discomfort after sitting >1hr      Pertinent Vitals/Pain Pain Assessment: No/denies pain Pain Intervention(s): Monitored during session    [place] expects to be discharged to:: Skilled nursing facility Living Arrangements: Alone Available Help at  Discharge: Family;Available PRN/intermittently Type of Home: House Home Access: Stairs to enter Entrance Stairs-Rails:  None Entrance Stairs-Number of Steps: 3   Home Layout: One level Home Equipment: Conservation officer, nature (2 wheels);Cane - single point;Grab bars - tub/shower          PT Goals (current goals can now be found in the care plan section) Acute Rehab PT Goals Patient Stated Goal: to be able to walk PT Goal Formulation: With patient Time For Goal Achievement: 03/20/21 Progress towards PT goals: Progressing toward goals    Frequency    Min 3X/week      PT Plan Current plan remains appropriate    Co-evaluation PT/OT/SLP Co-Evaluation/Treatment: Yes Reason for Co-Treatment: Necessary to address cognition/behavior during functional activity;For patient/therapist safety;Complexity of the patient's impairments (multi-system involvement);To address functional/ADL transfers PT goals addressed during session: Mobility/safety with mobility;Balance;Proper use of DME;Strengthening/ROM OT goals addressed during session: ADL's and self-care;Strengthening/ROM      AM-PAC PT "6 Clicks" Mobility   Outcome Measure  Help needed turning from your back to your side while in a flat bed without using bedrails?: A Lot Help needed moving from lying on your back to sitting on the side of a flat bed without using bedrails?: A Lot Help needed moving to and from a bed to a chair (including a wheelchair)?: A Lot Help needed standing up from a chair using your arms (e.g., wheelchair or bedside chair)?: A Lot Help needed to walk in hospital room?: Total Help needed climbing 3-5 steps with a railing? : Total 6 Click Score: 10    End of Session Equipment Utilized During Treatment: Gait belt Activity Tolerance: Patient tolerated treatment well Patient left: in bed;with bed alarm set;with call bell/phone within reach;with SCD's reapplied Nurse Communication: Mobility status;Need for lift equipment (Use +2 with stedy) PT Visit Diagnosis: Muscle weakness (generalized) (M62.81);Unsteadiness on feet (R26.81);Hemiplegia  and hemiparesis Hemiplegia - Right/Left: Right Hemiplegia - caused by: Cerebral infarction     Time: 0037-0488 PT Time Calculation (min) (ACUTE ONLY): 29 min  Charges:  $Therapeutic Activity: 8-22 mins                     Cathy Silva, Student PTA CI: Cathy P., PTA  Cathy Silva 03/07/2021, 5:16 PM

## 2021-03-07 NOTE — NC FL2 (Signed)
[place] MEDICAID FL2 LEVEL OF CARE SCREENING TOOL     IDENTIFICATION  Patient Name: Cathy Silva Birthdate: 1929-11-29 Sex: female Admission Date (Current Location): 03/05/2021  [facility] and [place] Number:  Herbalist and Address:  [facility], [place] [address], [place], [place] [address]      Provider Number: [id number]  Attending Physician Name and Address:  Thurnell Lose, MD  Relative Name and Phone Number:  [name],[name] Relative [phone]  [phone]    Current Level of Care: Hospital Recommended Level of Care: [place] Prior Approval Number:    Date Approved/Denied:   PASRR Number: [id number]  Discharge Plan: SNF    Current Diagnoses: Patient Active Problem List   Diagnosis Date Noted   CVA (cerebral vascular accident) ([place]) 03/05/2021   Acute CVA (cerebrovascular accident) ([place]) 03/05/2021   Acute urinary retention 03/02/2021   UTI (urinary tract infection) 03/02/2021   Elevated LFTs 03/02/2021   Elevated troponin 03/02/2021   Leukocytosis 03/02/2021   Normocytic normochromic anemia 03/02/2021   Rhabdomyolysis 02/24/2021   Nonrheumatic aortic valve stenosis 06/26/2020   Aortic atherosclerosis ([place]) 06/26/2020   Hyperlipidemia 06/26/2020   RBBB 06/26/2020   Hyponatremia 05/07/2015   Essential hypertension 05/07/2015   Nausea & vomiting 05/07/2015   Back pain 05/07/2015   Mid back pain     Orientation RESPIRATION BLADDER Height & Weight     Self, Place  Normal Incontinent, Indwelling catheter Weight:   Height:     BEHAVIORAL SYMPTOMS/MOOD NEUROLOGICAL BOWEL NUTRITION STATUS      Incontinent Diet (see discharge summary)  AMBULATORY STATUS COMMUNICATION OF NEEDS Skin   Total Care Verbally Skin abrasions                       Personal Care Assistance Level of Assistance    Bathing Assistance: Limited assistance Feeding assistance: Limited assistance Dressing Assistance:  Limited assistance     Functional Limitations Info    Sight Info: Impaired Hearing Info: Impaired Speech Info: Adequate    SPECIAL CARE FACTORS FREQUENCY  PT (By licensed PT), OT (By licensed OT)     PT Frequency: 5x week OT Frequency: 5x week            Contractures Contractures Info: Not present    Additional Factors Info  Code Status Code Status Info: full Allergies Info: Novocain (Procaine), Codeine, Pneumococcal Vaccines, Pseudoephedrine, Tetanus Toxoids, Tetanus-diphtheria Toxoids Td           Current Medications (03/07/2021):  This is the current hospital active medication list Current Facility-Administered Medications  Medication Dose Route Frequency Provider Last Rate Last Admin   acetaminophen (TYLENOL) tablet 650 mg  650 mg Oral Q4H PRN Chotiner, Yevonne Aline, MD       Or   acetaminophen (TYLENOL) 160 MG/5ML solution 650 mg  650 mg Per Tube Q4H PRN Chotiner, Yevonne Aline, MD       Or   acetaminophen (TYLENOL) suppository 650 mg  650 mg Rectal Q4H PRN Chotiner, Yevonne Aline, MD       aspirin chewable tablet 81 mg  81 mg Oral Daily Donnetta Simpers, MD   81 mg at 03/07/21 1146   Or   aspirin suppository 300 mg  300 mg Rectal Daily Donnetta Simpers, MD       Chlorhexidine Gluconate Cloth 2 % PADS 6 each  6 each Topical Daily Thurnell Lose, MD       enoxaparin (LOVENOX)  injection 30 mg  30 mg Subcutaneous Q24H Thurnell Lose, MD   30 mg at 03/07/21 1146   feeding supplement (BOOST / RESOURCE BREEZE) liquid 1 Container  1 Container Oral Daily Chotiner, Yevonne Aline, MD   1 Container at 03/07/21 1149   hydrALAZINE (APRESOLINE) injection 10 mg  10 mg Intravenous Q6H PRN Thurnell Lose, MD       multivitamin with minerals tablet 1 tablet  1 tablet Oral Q breakfast Chotiner, Yevonne Aline, MD   1 tablet at 03/07/21 0824   rosuvastatin (CRESTOR) tablet 20 mg  20 mg Oral Daily Dennison Mascot, PA-C   20 mg at 03/07/21 1145   senna-docusate (Senokot-S) tablet 1 tablet  1  tablet Oral QHS PRN Chotiner, Yevonne Aline, MD       tamsulosin (FLOMAX) capsule 0.4 mg  0.4 mg Oral Daily Thurnell Lose, MD   0.4 mg at 03/06/21 1305     Discharge Medications: Please see discharge summary for a list of discharge medications.  Relevant Imaging Results:  Relevant Lab Results:   Additional Information SSN [id number]  Joanne Chars, LCSW

## 2021-03-07 NOTE — Evaluation (Signed)
Occupational Therapy Evaluation Patient Details Name: Cathy Silva MRN: [id number] DOB: 04-Dec-1929 Today's Date: 03/07/2021   History of Present Illness Pt is an 87 y/o female admitted from SNF on 11/7 secondary to R arm weakness. Found to have L MCA infarct. PMH includes HTN.   Clinical Impression   Pt admitted for concerns listed above. PTA pt was in SNF rehab working on transfers, ambulation, and independent ADL's. Pt has since had a significant decrease in strength, activity tolerance, cognition, and balance. She is requiring mod-Max A +2 for all transfers and min-max A +2 for all ADL's. Pts RUE is very weak with minimal hand and wrist movement, and unable to withstand any resistance at her elbow and shoulder. Pt will benefit from returning to SNF for continued rehab to maximize her return to independence. Acute OT will follow to address concerns listed below.       Recommendations for follow up therapy are one component of a multi-disciplinary discharge planning process, led by the attending physician.  Recommendations may be updated based on patient status, additional functional criteria and insurance authorization.   Follow Up Recommendations  Skilled nursing-short term rehab (<3 hours/day)    Assistance Recommended at Discharge Frequent or constant Supervision/Assistance  Functional Status Assessment  Patient has had a recent decline in their functional status and demonstrates the ability to make significant improvements in function in a reasonable and predictable amount of time.  Equipment Recommendations  None recommended by OT    Recommendations for Other Services       Precautions / Restrictions Precautions Precautions: Fall Precaution Comments: RUE/LE hemiplegia and decreased sensation, HoH Restrictions Weight Bearing Restrictions: No      Mobility Bed Mobility Overal bed mobility: Needs Assistance Bed Mobility: Supine to Sit     Supine to sit: Min assist      General bed mobility comments: Pt able to assist well with cross-body reaching to opposite bed rail to sit up to R, needs multimodal cues to initiate and pt pulling up on therapist arm to raise trunk. Needs assist for scooting to foot flat with bed pad.    Transfers Overall transfer level: Needs assistance Equipment used: Rolling walker (2 wheels) Transfers: Sit to/from Stand;Bed to chair/wheelchair/BSC Sit to Stand: Mod assist;+2 physical assistance Stand pivot transfers: Max assist;+2 physical assistance         General transfer comment: from EOB minA to rise and modA for steadying, increased R lean as she stood longer. Pt unable to maintain RUE on RW and needs manual assist to prevent it from falling off handle. Needs +2 maxA for pivotal steps toward recliner on pt R side, needs manual assist to facilitate stepping on RLE (more assist as she fatigues) and totalA to maintain RLE on RW. She may do better with R platform RW next session.      Balance Overall balance assessment: Needs assistance Sitting-balance support: No upper extremity supported;Feet supported Sitting balance-Leahy Scale: Poor Sitting balance - Comments: R lateral lean needs cues for midline posture Postural control: Right lateral lean Standing balance support: Single extremity supported Standing balance-Leahy Scale: Poor Standing balance comment: Reliant on LUE support and external support, heavy R lean                           ADL either performed or assessed with clinical judgement   ADL Overall ADL's : Needs assistance/impaired Eating/Feeding: Minimal assistance;Sitting   Grooming: Minimal assistance;Sitting   Upper  Body Bathing: Minimal assistance;Sitting   Lower Body Bathing: Maximal assistance;+2 for safety/equipment;Sitting/lateral leans;Sit to/from stand   Upper Body Dressing : Moderate assistance;Sitting   Lower Body Dressing: Maximal assistance;+2 for  safety/equipment;Sitting/lateral leans;Sit to/from stand   Toilet Transfer: Moderate assistance;+2 for physical assistance;+2 for safety/equipment;Stand-pivot   Toileting- Clothing Manipulation and Hygiene: Maximal assistance;+2 for safety/equipment;Sitting/lateral lean;Sit to/from stand       Functional mobility during ADLs: Moderate assistance;+2 for physical assistance;+2 for safety/equipment;Rolling walker (2 wheels) General ADL Comments: Pt heavily leaning to the R side, requiring cueing and support to maintain midline. due to poor balance and weakness, pt requiring max A for all LB ADL's and due to RUE weakness/numbness, pt requiring min-mod A for UB ADL's.     Vision Baseline Vision/History: 1 Wears glasses Ability to See in Adequate Light: 0 Adequate Patient Visual Report: No change from baseline Vision Assessment?: No apparent visual deficits     Perception     Praxis      Pertinent Vitals/Pain Pain Assessment: No/denies pain Pain Intervention(s): Monitored during session;Repositioned     Hand Dominance Right   Extremity/Trunk Assessment Upper Extremity Assessment Upper Extremity Assessment: RUE deficits/detail RUE Deficits / Details: RUE with increased swelling and weakness. Limited grip strength. Elbow flexors/extensors at 1/5. RUE Sensation: decreased light touch RUE Coordination: decreased fine motor;decreased gross motor   Lower Extremity Assessment Lower Extremity Assessment: Defer to PT evaluation   Cervical / Trunk Assessment Cervical / Trunk Assessment: Kyphotic   Communication Communication Communication: Expressive difficulties   Cognition Arousal/Alertness: Awake/alert Behavior During Therapy: WFL for tasks assessed/performed Overall Cognitive Status: Impaired/Different from baseline Area of Impairment: Memory;Problem solving                     Memory: Decreased short-term memory       Problem Solving: Slow processing;Decreased  initiation;Requires verbal cues General Comments: Pt reports she isn't sure where she is supposed to be going and that she hasn't seen niece yet today, pt assisted to put glasses on so she can review facility list on her tray table from social work. Fair following of simple 1-step commands.     General Comments  HR 100-130's    Exercises Exercises: Other exercises General Exercises - Lower Extremity Ankle Circles/Pumps: AROM;Both;10 reps;Supine Hip Flexion/Marching: AROM;Right;10 reps;Standing Other Exercises Other Exercises: supine RUE AROM: elbow flex/ext x5 reps   Shoulder Instructions      Home Living Family/patient expects to be discharged to:: Skilled nursing facility Living Arrangements: Alone Available Help at Discharge: Family;Available PRN/intermittently Type of Home: House Home Access: Stairs to enter [employer] of Steps: 3 Entrance Stairs-Rails: None Home Layout: One level     Bathroom Shower/Tub: Occupational psychologist: Standard Bathroom Accessibility: Yes How Accessible: Accessible via walker Home Equipment: Rolling Walker (2 wheels);Cane - single point;Grab bars - tub/shower          Prior Functioning/Environment Prior Level of Function : Needs assist       Physical Assist : ADLs (physical);Mobility (physical) Mobility (physical): Gait;Transfers;Bed mobility ADLs (physical): Dressing;Bathing Mobility Comments: Pt currently at SNF for rehab and was working on ambulation and transfers with PT ADLs Comments: Was supposed to have assist with ADLs, but niece unsure that staff were giving assist.        OT Problem List: Decreased strength;Decreased activity tolerance;Impaired balance (sitting and/or standing);Decreased cognition;Decreased safety awareness;Decreased knowledge of use of DME or AE;Decreased knowledge of precautions      OT Treatment/Interventions:  Self-care/ADL training;Therapeutic exercise;Neuromuscular education;DME  and/or AE instruction;Energy conservation;Therapeutic activities;Balance training;Patient/family education    OT Goals(Current goals can be found in the care plan section) Acute Rehab OT Goals Patient Stated Goal: To get stronger OT Goal Formulation: With patient Time For Goal Achievement: 03/21/21 Potential to Achieve Goals: Good ADL Goals Pt Will Perform Eating: with modified independence;with adaptive utensils;sitting Pt Will Perform Grooming: with modified independence;with adaptive equipment;sitting Pt Will Perform Upper Body Bathing: with modified independence;with adaptive equipment;sitting Pt Will Perform Lower Body Bathing: with min assist;sitting/lateral leans;sit to/from stand;with adaptive equipment Pt Will Perform Upper Body Dressing: with set-up;sitting Pt Will Perform Lower Body Dressing: with min assist;sitting/lateral leans;sit to/from stand Pt Will Transfer to Toilet: with min assist;stand pivot transfer Pt Will Perform Toileting - Clothing Manipulation and hygiene: with min assist;sitting/lateral leans;sit to/from stand;with adaptive equipment  OT Frequency: Min 2X/week   Barriers to D/C:            Co-evaluation PT/OT/SLP Co-Evaluation/Treatment: Yes Reason for Co-Treatment: Necessary to address cognition/behavior during functional activity;For patient/therapist safety;Complexity of the patient's impairments (multi-system involvement);To address functional/ADL transfers PT goals addressed during session: Mobility/safety with mobility;Balance;Proper use of DME;Strengthening/ROM OT goals addressed during session: ADL's and self-care;Strengthening/ROM      AM-PAC OT "6 Clicks" Daily Activity     Outcome Measure Help from another person eating meals?: A Little Help from another person taking care of personal grooming?: A Little Help from another person toileting, which includes using toliet, bedpan, or urinal?: A Lot Help from another person bathing (including  washing, rinsing, drying)?: A Lot Help from another person to put on and taking off regular upper body clothing?: A Lot Help from another person to put on and taking off regular lower body clothing?: A Lot 6 Click Score: 14   End of Session Equipment Utilized During Treatment: Gait belt;Rolling walker (2 wheels) Nurse Communication: Mobility status  Activity Tolerance: Patient tolerated treatment well Patient left: in chair;with call bell/phone within reach;with chair alarm set  OT Visit Diagnosis: Unsteadiness on feet (R26.81);Other abnormalities of gait and mobility (R26.89);Muscle weakness (generalized) (M62.81);Hemiplegia and hemiparesis Hemiplegia - Right/Left: Right Hemiplegia - dominant/non-dominant: Dominant Hemiplegia - caused by: Cerebral infarction                Time: 9935-7017 OT Time Calculation (min): 30 min Charges:  OT General Charges $OT Visit: 1 Visit OT Evaluation $OT Eval Moderate Complexity: 1 Mod  Katiria Calame H., OTR/L Acute Rehabilitation  Ireoluwa Gorsline Elane Yolanda Bonine 03/07/2021, 4:46 PM

## 2021-03-07 NOTE — Progress Notes (Signed)
Physical Therapy Treatment Patient Details Name: Cathy Silva MRN: [id number] DOB: 18-Feb-1930 Today's Date: 03/07/2021   History of Present Illness Pt is an 87 y/o female admitted from [facility] on [facility]/7 secondary to R arm weakness. Found to have L MCA infarct. PMH includes HTN.    PT Comments    Pt received in supine, agreeable to therapy session and with good participation in transfer training. Pt needing up to maxA +2 (3rd person present providing intermittent stand-by assist) for dynamic standing tasks/pivot to chair. Pt standing/pre-gait tolerance limited due to heavy R lean in stance and with seated tasks needs cues to attain midline posture. Pt agreeable to sit up in chair up to 1 hour at end of session. Pt continues to benefit from PT services to progress toward functional mobility goals.   Recommendations for follow up therapy are one component of a multi-disciplinary discharge planning process, led by the attending physician.  Recommendations may be updated based on patient status, additional functional criteria and insurance authorization.  Follow Up Recommendations  Skilled nursing-short term rehab (<3 hours/day)     Assistance Recommended at Discharge Frequent or constant Supervision/Assistance  Equipment Recommendations  None recommended by PT (defer to post-acute)    Recommendations for Other Services       Precautions / Restrictions Precautions Precautions: Fall Precaution Comments: RUE/LE hemiplegia and decreased sensation, HoH Restrictions Weight Bearing Restrictions: No     Mobility  Bed Mobility Overal bed mobility: Needs Assistance Bed Mobility: Supine to Sit;Sit to Supine     Supine to sit: Min assist     General bed mobility comments: Pt able to assist well with cross-body reaching to opposite bed rail to sit up to R, needs multimodal cues to initiate and pt pulling up on therapist arm to raise trunk. Needs assist for scooting to foot flat with bed  pad.    Transfers Overall transfer level: Needs assistance Equipment used: Rolling walker (2 wheels) Transfers: Sit to/from Stand;Bed to chair/wheelchair/BSC Sit to Stand: Mod assist;+2 physical assistance Stand pivot transfers: Max assist;+2 physical assistance         General transfer comment: from EOB minA to rise and modA for steadying, increased R lean as she stood longer. Pt unable to maintain RUE on RW and needs manual assist to prevent it from falling off handle. Needs +2 maxA for pivotal steps toward recliner on pt R side, needs manual assist to facilitate stepping on RLE (more assist as she fatigues) and totalA to maintain RLE on RW. She may do better with R platform RW next session.      Modified Rankin (Stroke Patients Only) Modified Rankin (Stroke Patients Only) Pre-Morbid Rankin Score: Moderate disability Modified Rankin: Severe disability     Balance Overall balance assessment: Needs assistance Sitting-balance support: No upper extremity supported;Feet supported Sitting balance-Leahy Scale: Poor Sitting balance - Comments: R lateral lean needs cues for midline posture   Standing balance support: Single extremity supported Standing balance-Leahy Scale: Poor Standing balance comment: Reliant on LUE support and external support, heavy R lean          Cognition Arousal/Alertness: Awake/alert Behavior During Therapy: WFL for tasks assessed/performed Overall Cognitive Status: Impaired/Different from baseline Area of Impairment: Memory;Problem solving                     Memory: Decreased short-term memory       Problem Solving: Slow processing;Decreased initiation;Requires verbal cues General Comments: Pt reports she isn't sure where she  is supposed to be going and that she hasn't seen niece yet today, pt assisted to put glasses on so she can review facility list on her tray table from social work. Fair following of simple 1-step commands.         Exercises General Exercises - Lower Extremity Ankle Circles/Pumps: AROM;Both;10 reps;Supine Hip Flexion/Marching: AROM;Right;10 reps;Standing Other Exercises Other Exercises: supine RUE AROM: elbow flex/ext x5 reps    General Comments General comments (skin integrity, edema, etc.): no acute s/sx distress, VSS per chart review      Pertinent Vitals/Pain Pain Assessment: No/denies pain Pain Intervention(s): Monitored during session;Repositioned     PT Goals (current goals can now be found in the care plan section) Acute Rehab PT Goals Patient Stated Goal: to be able to walk PT Goal Formulation: With patient Time For Goal Achievement: 03/20/21 Progress towards PT goals: Progressing toward goals    Frequency    Min 3X/week      PT Plan Current plan remains appropriate    Co-evaluation PT/OT/SLP Co-Evaluation/Treatment: Yes Reason for Co-Treatment: Complexity of the patient's impairments (multi-system involvement);Necessary to address cognition/behavior during functional activity;For patient/therapist safety;To address functional/ADL transfers PT goals addressed during session: Mobility/safety with mobility;Balance;Proper use of DME;Strengthening/ROM        AM-PAC PT "6 Clicks" Mobility   Outcome Measure  Help needed turning from your back to your side while in a flat bed without using bedrails?: A Lot Help needed moving from lying on your back to sitting on the side of a flat bed without using bedrails?: A Lot Help needed moving to and from a bed to a chair (including a wheelchair)?: A Lot Help needed standing up from a chair using your arms (e.g., wheelchair or bedside chair)?: A Lot Help needed to walk in hospital room?: Total Help needed climbing 3-5 steps with a railing? : Total 6 Click Score: 10    End of Session Equipment Utilized During Treatment: Gait belt Activity Tolerance: Patient tolerated treatment well Patient left: in chair;with call bell/phone  within reach;with chair alarm set Nurse Communication: Mobility status;Need for lift equipment (use +2 with Stedy) PT Visit Diagnosis: Muscle weakness (generalized) (M62.81);Unsteadiness on feet (R26.81);Hemiplegia and hemiparesis Hemiplegia - Right/Left: Right Hemiplegia - caused by: Cerebral infarction     Time: 7673-4193 PT Time Calculation (min) (ACUTE ONLY): 29 min  Charges:  $Therapeutic Activity: 8-22 mins                     Rebeca Valdivia P., PTA Acute Rehabilitation Services Pager: [phone] Office: [place] 03/07/2021, 4:04 PM

## 2021-03-07 NOTE — Evaluation (Signed)
Speech Language Pathology Evaluation Patient Details Name: Cathy Silva MRN: [id number] DOB: 1930-02-05 Today's Date: 03/07/2021 Time: 4765-4650 SLP Time Calculation (min) (ACUTE ONLY): 29 min  Problem List:  Patient Active Problem List   Diagnosis Date Noted   CVA (cerebral vascular accident) ([place]) 03/05/2021   Acute CVA (cerebrovascular accident) ([place]) 03/05/2021   Acute urinary retention 03/02/2021   UTI (urinary tract infection) 03/02/2021   Elevated LFTs 03/02/2021   Elevated troponin 03/02/2021   Leukocytosis 03/02/2021   Normocytic normochromic anemia 03/02/2021   Rhabdomyolysis 02/24/2021   Nonrheumatic aortic valve stenosis 06/26/2020   Aortic atherosclerosis ([place]) 06/26/2020   Hyperlipidemia 06/26/2020   RBBB 06/26/2020   Hyponatremia 05/07/2015   Essential hypertension 05/07/2015   Nausea & vomiting 05/07/2015   Back pain 05/07/2015   Mid back pain    Past Medical History:  Past Medical History:  Diagnosis Date   Allergies    BCC (basal cell carcinoma), arm 2008   Left    Elevated cholesterol    Estrogen deficiency    Fracture    in back   Hx of scarlet fever    Hypercholesteremia    Hyponatremia    Insomnia    Low sodium levels    Melanoma in situ ([place]) 2009   Mitral valve prolapse    Murmur    Psychosocial problem    Thrombocytosis    Past Surgical History:  Past Surgical History:  Procedure Laterality Date   BALLOON DILATION N/A 05/11/2015   Procedure: BALLOON DILATION;  Surgeon: Carol Ada, MD;  Location: [facility] ENDOSCOPY;  Service: Endoscopy;  Laterality: N/A;   DILATION AND CURETTAGE OF UTERUS  05/1984   Prolif   DILATION AND CURETTAGE OF UTERUS  06/1986   benign   ESOPHAGOGASTRODUODENOSCOPY Left 05/11/2015   Procedure: ESOPHAGOGASTRODUODENOSCOPY (EGD);  Surgeon: Carol Ada, MD;  Location: [facility] ENDOSCOPY;  Service: Endoscopy;  Laterality: Left;   EYE SURGERY     bilat cataract   TONSILLECTOMY     TOTAL ABDOMINAL HYSTERECTOMY W/ BILATERAL  SALPINGOOPHORECTOMY  05/1987   Leiomyoma   HPI:  87yo female admitted 03/05/21 with RUE weakness, facial droop and dysarthria.  PMH: back pain, HTN, hypercholesteremia, hyponatremia, recent admission for rhabdo after fall at home. MRI = acute left frontal MCA branch CVA   Assessment / Plan / Recommendation Clinical Impression  Pt seen at bedside for assessment of speech and receptive/expressive language. No family present to discuss prior level of function. Pt is quite hard of hearing, which impairs communicative attempts to begin with.   Pt exhibits mild dysarthria, with mild right facial weakness and mild lingual strength. Speech is ~80% intelligible to unfamiliar listener. She presents with mild receptive language impairment, characterized by difficulty with complex/abstract yes/no questions. She is able to follow 1-2 step verbal directions, however, repetition is frequently required due to decreased hearing acuity. Right/left discrimination appears intact. She presents with mild expressive language deficits, characterized by difficulty with automatic sequences. Pt exhibits perseveration and paraphasic errors intermittently throughout tasks. She is able to repeat multisyllabic words and sentence length material accurately. Sentence completion and responsive naming tasks were intact. She exhibited difficulty with verbal fluency (naming caterogy members), and mental flexibility (providing multiple different meanings to common words). She was able to answer my questions about living arrangements and responsibilities at home with her niece. Hearing impairment interferes with a significant amount of difficulty. SLP will continue to follow acutely for receptive and expressive aphasia. Based on improvement here, she may benefit from  continued ST intervention after DC.    SLP Assessment  SLP Recommendation/Assessment: Patient needs continued Speech Language Pathology Services  SLP Visit Diagnosis: Aphasia  (R47.01);Dysarthria and anarthria (R47.1)    Recommendations for follow up therapy are one component of a multi-disciplinary discharge planning process, led by the attending physician.  Recommendations may be updated based on patient status, additional functional criteria and insurance authorization.    Follow Up Recommendations  Skilled nursing-short term rehab (<3 hours/day)    Assistance Recommended at Discharge  Frequent or constant Supervision/Assistance  Functional Status Assessment Patient has had a recent decline in their functional status and/or demonstrates limited ability to make significant improvements in function in a reasonable and predictable amount of time  Frequency and Duration min 2x/week  2 weeks      SLP Evaluation Cognition  Overall Cognitive Status: No family/caregiver present to determine baseline cognitive functioning (difficult to assess due to mild receptive and expressive aphasia) Arousal/Alertness: Awake/alert Orientation Level: Oriented to person;Oriented to place       Comprehension  Auditory Comprehension Overall Auditory Comprehension: Impaired Yes/No Questions: Impaired Basic Biographical Questions: 76-100% accurate Basic Immediate Environment Questions: 75-100% accurate Complex Questions: 25-49% accurate Commands: Impaired One Step Basic Commands: 75-100% accurate (repetition required) Two Step Basic Commands: 75-100% accurate (repetition required.) Conversation: Simple Interfering Components: Hearing EffectiveTechniques: Repetition Environmental consultant Discrimination: Not tested Reading Comprehension Reading Status: Not tested    Expression Expression Primary Mode of Expression: Verbal Verbal Expression Overall Verbal Expression: Impaired Initiation: No impairment Automatic Speech: Name;Social Response (perseveration. Min difficulty with automatic sequences) Level of Generative/Spontaneous Verbalization:  Phrase Repetition: No impairment Naming: Impairment Responsive: 76-100% accurate Confrontation: Within functional limits Other Naming Comments: paraphasic errors and mild perseveration; difficulty with verbal fluency task, and mental flexibility Verbal Errors: Phonemic paraphasias;Neologisms Pragmatics: No impairment Non-Verbal Means of Communication: Not applicable Written Expression Dominant Hand: Right Written Expression: Not tested   Oral / Motor  Oral Motor/Sensory Function Overall Oral Motor/Sensory Function: Mild impairment Facial ROM: Reduced right Facial Symmetry: Abnormal symmetry right Facial Strength: Reduced right Lingual ROM: Within Functional Limits Lingual Symmetry: Within Functional Limits Lingual Strength: Reduced Motor Speech Overall Motor Speech: Impaired Respiration: Within functional limits Phonation: Normal Resonance: Within functional limits Articulation: Impaired Level of Impairment: Sentence Intelligibility: Intelligibility reduced Word: 75-100% accurate Phrase: 75-100% accurate Sentence: 75-100% accurate Conversation: Not tested Motor Planning: Witnin functional limits Interfering Components: Hearing loss   GO                   Guido Comp B. Quentin Ore, [facility], [place] Speech Language Pathologist Office: [phone]  Shonna Chock 03/07/2021, 10:12 AM

## 2021-03-08 DIAGNOSIS — Z66 Do not resuscitate: Secondary | ICD-10-CM

## 2021-03-08 DIAGNOSIS — Z7189 Other specified counseling: Secondary | ICD-10-CM

## 2021-03-08 DIAGNOSIS — Z515 Encounter for palliative care: Secondary | ICD-10-CM

## 2021-03-08 LAB — COMPREHENSIVE METABOLIC PANEL
ALT: 21 U/L (ref 0–44)
AST: 19 U/L (ref 15–41)
Albumin: 2.4 g/dL — ABNORMAL LOW (ref 3.5–5.0)
Alkaline Phosphatase: 52 U/L (ref 38–126)
Anion gap: 7 (ref 5–15)
BUN: 7 mg/dL — ABNORMAL LOW (ref 8–23)
CO2: 24 mmol/L (ref 22–32)
Calcium: 7.9 mg/dL — ABNORMAL LOW (ref 8.9–10.3)
Chloride: 96 mmol/L — ABNORMAL LOW (ref 98–111)
Creatinine, Ser: 0.52 mg/dL (ref 0.44–1.00)
GFR, Estimated: >60 mL/min (ref >60)
Glucose, Bld: 103 mg/dL — ABNORMAL HIGH (ref 70–99)
Potassium: 3.7 mmol/L (ref 3.5–5.1)
Sodium: 127 mmol/L — ABNORMAL LOW (ref 135–145)
Total Bilirubin: 1 mg/dL (ref 0.3–1.2)
Total Protein: 5.1 g/dL — ABNORMAL LOW (ref 6.5–8.1)

## 2021-03-08 LAB — CBC WITH DIFFERENTIAL/PLATELET
Abs Immature Granulocytes: 0.05 10*3/uL (ref 0.00–0.07)
Basophils Absolute: 0.1 10*3/uL (ref 0.0–0.1)
Basophils Relative: 1 %
Eosinophils Absolute: 0.3 10*3/uL (ref 0.0–0.5)
Eosinophils Relative: 3 %
HCT: 33.3 % — ABNORMAL LOW (ref 36.0–46.0)
Hemoglobin: 11.3 g/dL — ABNORMAL LOW (ref 12.0–15.0)
Immature Granulocytes: 1 %
Lymphocytes Relative: 17 %
Lymphs Abs: 1.7 10*3/uL (ref 0.7–4.0)
MCH: 27.9 pg (ref 26.0–34.0)
MCHC: 33.9 g/dL (ref 30.0–36.0)
MCV: 82.2 fL (ref 80.0–100.0)
Monocytes Absolute: 1.2 10*3/uL — ABNORMAL HIGH (ref 0.1–1.0)
Monocytes Relative: 12 %
Neutro Abs: 6.6 10*3/uL (ref 1.7–7.7)
Neutrophils Relative %: 66 %
Platelets: 389 10*3/uL (ref 150–400)
RBC: 4.05 MIL/uL (ref 3.87–5.11)
RDW: 14.1 % (ref 11.5–15.5)
WBC: 10.1 10*3/uL (ref 4.0–10.5)
nRBC: 0 % (ref 0.0–0.2)

## 2021-03-08 LAB — BRAIN NATRIURETIC PEPTIDE: B Natriuretic Peptide: 294.4 pg/mL — ABNORMAL HIGH (ref 0.0–100.0)

## 2021-03-08 LAB — MAGNESIUM: Magnesium: 2.1 mg/dL (ref 1.7–2.4)

## 2021-03-08 NOTE — Progress Notes (Signed)
PROGRESS NOTE                                                                                                                                                                                                             Patient Demographics:    Cathy Silva, is a 87 y.o. female, DOB - [birth year], ZOX:[id number]  Outpatient Primary MD for the patient is Koirala, Dibas, MD    LOS - 3  Admit date - 03/05/2021    Chief Complaint  Patient presents with   Weakness   Stroke Symptoms       Brief Narrative (HPI from H&P)   Cathy Silva is a 87 y.o. female with medical history significant for HTN, anemia, hyponatremia, recent admission for rhabdomyolysis after fall at home.  She was discharged last week to a skilled rehabilitation facility.  Her niece was visiting her every day on Saturday afternoon she noted that Cathy Silva was not acting normally and had slurred speech and right facial droop and she was not able to move or use her right arm or hand.     Subjective:   Patient in bed, appears comfortable, denies any headache, no fever, no chest pain or pressure, no shortness of breath , no abdominal pain. R arm is weak.   Assessment  & Plan :     Acute metabolic encephalopathy with right-sided weakness and slurred speech due to acute CVA. - she has been seen by neurology team and full stroke work-up per neurology and stroke team.  Currently on aspirin, statin added for better LDL control, A1c stable.  She has significant weakness in her right arm and she is right-handed, will require placement as she lives alone, continue PT OT and supportive care, dysarthria seems to have improved on 03/08/2021.  2.  Dyslipidemia.  Placed on statin.  3.  Hyponatremia.  SIADH, worse after IV fluids, free water restriction, gentle Lasix 1 dose on 03/07/2021.  Monitor  4.  Hypertension.  For now permissive hypertension due to stroke.  5.   Urinary outflow obstruction present on admission.  Came in with Foley catheter along with Flomax.  Apparently she has had Foley for a number of weeks.  She will require urology follow-up outpatient post discharge         Condition - Extremely Guarded  Family Communication  :  message left for friend [name] [phone] 03/06/21 at 10.30 am, message left again on 03/07/2021 at 8:15 AM, called again and left a message on 03/07/2021 at 12:16 PM, updated 03/07/21 on the 4th call.  Code Status :  Full  Consults  :  Neuro  PUD Prophylaxis : None   Procedures  :     TTE - 02/25/21 -    1. Left ventricular ejection fraction, by estimation, is 60 to 65%. The left ventricle has normal function. The left ventricle has no regional wall motion abnormalities. There is severe concentric left ventricular hypertrophy. Indeterminate diastolic filling due to E-A fusion.   2. Right ventricular systolic function is normal. The right ventricular size is normal. Tricuspid regurgitation signal is inadequate for assessing PA pressure.   3. The mitral valve is degenerative. No evidence of mitral valve regurgitation. No evidence of mitral stenosis.   4. The aortic valve is calcified. There is severe calcifcation of the aortic valve. There is severe thickening of the aortic valve. Aortic valve regurgitation is not visualized. Moderate aortic valve stenosis. Aortic valve mean gradient measures 21.0 mmHg. Aortic valve Vmax measures 3.18 m/s.   5. The inferior vena cava is normal in size with <50% respiratory variability, suggesting right atrial pressure of 8 mmHg.   Vas. US Neck - Non Acute  MRI/A- IMPRESSION: 1. Patchy acute ischemic nonhemorrhagic left MCA territory infarct involving the posterior left frontoparietal region as above. No associated mass effect. 2. Additional acute to early subacute ischemic infarct involving the right frontal corona radiata. Associated minimal petechial hemorrhage without frank  hemorrhagic transformation or significant mass effect. 3. Underlying age-related cerebral atrophy with mild chronic small vessel ischemic disease. MRA HEAD IMPRESSION: 1. Left M3 branch occlusion, in keeping with the acute left MCA territory infarct. 2. Moderate intracranial atherosclerotic change elsewhere throughout the intracranial circulation as above. Additional notable findings include severe left A1 and A2 stenoses, with moderate to severe bilateral distal P3 and P4 stenoses      Disposition Plan  :    Status is: Inpatient  Remains inpatient appropriate because: CVA- needs SNF  DVT Prophylaxis  :    enoxaparin (LOVENOX) injection 30 mg Start: 03/06/21 1200 SCD's Start: 03/05/21 2225 Lovenox    Lab Results  Component Value Date   PLT 389 03/08/2021    Diet :  Diet Order             Diet Heart Room service appropriate? Yes; Fluid consistency: Thin; Fluid restriction: 1200 mL Fluid  Diet effective now                    Inpatient Medications  Scheduled Meds:  aspirin  81 mg Oral Daily   Or   aspirin  300 mg Rectal Daily   Chlorhexidine Gluconate Cloth  6 each Topical Daily   enoxaparin (LOVENOX) injection  30 mg Subcutaneous Q24H   feeding supplement  1 Container Oral Daily   multivitamin with minerals  1 tablet Oral Q breakfast   rosuvastatin  20 mg Oral Daily   tamsulosin  0.4 mg Oral Daily   Continuous Infusions:   PRN Meds:.acetaminophen **OR** acetaminophen (TYLENOL) oral liquid 160 mg/5 mL **OR** acetaminophen, hydrALAZINE, senna-docusate  Antibiotics  :    Anti-infectives (From admission, onward)    None        Time Spent in minutes  30   Cathy Silva M.D on 03/08/2021 at 10:20 AM  To page go to [email or web address]  [facility] -  Office  [phone]  See all Orders from today for further details    Objective:   Vitals:   03/07/21 2311 03/07/21 2313 03/08/21 0315 03/08/21 0757  BP: (!) 161/62 (!) 155/63 (!) 167/64 (!) 176/76   Pulse: 80 80  80  Resp: 16  18 18   Temp: 98.1 F (36.7 C) 98 F (36.7 C) 98.4 F (36.9 C) 98.2 F (36.8 C)  TempSrc: Oral Oral Oral Oral  SpO2: 97%  95%     Wt Readings from Last 3 Encounters:  03/05/21 68.8 kg  03/01/21 68.8 kg  02/24/21 63.5 kg     Intake/Output Summary (Last 24 hours) at 03/08/2021 1020 Last data filed at 03/08/2021 0900 Gross per 24 hour  Intake 150 ml  Output 500 ml  Net -350 ml     Physical Exam  Awake , dysarthria, ++ R. Arm >> leg weakness, Foley in place [place].AT,PERRAL Supple Neck, No JVD,   Symmetrical Chest wall movement, Good air movement bilaterally, CTAB RRR,No Gallops, Rubs or new Murmurs,  +ve B.Sounds, Abd Soft, No tenderness,   No Cyanosis, Clubbing or edema        Data Review:    CBC Recent Labs  Lab 03/05/21 1025 03/05/21 1039 03/07/21 0329 03/08/21 0420  WBC 9.5  --  9.9 10.1  HGB 11.4* 10.9* 10.6* 11.3*  HCT 33.1* 32.0* 31.0* 33.3*  PLT 369  --  376 389  MCV 81.7  --  81.8 82.2  MCH 28.1  --  28.0 27.9  MCHC 34.4  --  34.2 33.9  RDW 14.1  --  14.0 14.1  LYMPHSABS 1.3  --  1.9 1.7  MONOABS 1.4*  --  1.2* 1.2*  EOSABS 0.1  --  0.3 0.3  BASOSABS 0.1  --  0.1 0.1    Electrolytes Recent Labs  Lab 03/05/21 1025 03/05/21 1039 03/05/21 1853 03/06/21 0258 03/07/21 0329 03/08/21 0420  NA 126* 127*  --  126* 125* 127*  K 3.5 3.7  --  3.6 3.6 3.7  CL 95* 94*  --  95* 96* 96*  CO2 23  --   --  23 23 24   GLUCOSE 119* 121*  --  102* 101* 103*  BUN 8 7*  --  6* 8 7*  CREATININE 0.57 0.50  --  0.54 0.54 0.52  CALCIUM 8.2*  --   --  8.0* 8.0* 7.9*  AST 24  --   --   --  17 19  ALT 31  --   --   --  23 21  ALKPHOS 47  --   --   --  45 52  BILITOT 1.1  --   --   --  0.9 1.0  ALBUMIN 2.6*  --   --   --  2.4* 2.4*  MG  --   --  1.8  --  1.6* 2.1  INR 1.0  --   --   --   --   --   TSH  --   --  2.867  --   --   --   HGBA1C  --   --   --  5.6  --   --   BNP  --   --   --   --  277.1* 294.4*     ------------------------------------------------------------------------------------------------------------------ Recent Labs    03/06/21 0258  CHOL 199  HDL 46  LDLCALC 141*  TRIG 62  CHOLHDL 4.3  Lab Results  Component Value Date   HGBA1C 5.6 03/06/2021    Recent Labs    03/05/21 1853  TSH 2.867     Radiology Reports CT HEAD WO CONTRAST (5MM)  Result Date: 03/07/2021 CLINICAL DATA:  Stroke, follow up EXAM: CT HEAD WITHOUT CONTRAST TECHNIQUE: Contiguous axial images were obtained from the base of the skull through the vertex without intravenous contrast. COMPARISON:  MRI 03/05/2021, head CT 03/05/2021 FINDINGS: Brain: There is mild loss of gray-white matter differentiation in the left MCA territory in the posterior left frontal and the parietal lobe consistent with infarct, as seen on MRI. Hypoattenuation in the right frontal corona radiata extending towards the insula also consistent with infarct. These findings are increased in conspicuity in comparison to prior CT.No concerning mass effect. No acute intracranial hemorrhage. The basal cisterns are patent.The ventricles are unchanged in size.Scattered subcortical and periventricular white matter hypodensities, nonspecific but likely sequela of chronic small vessel ischemic disease.Mild cerebral atrophy Vascular: Vertebral artery calcifications. Skull: Normal. Negative for fracture or focal lesion. Sinuses/Orbits: No acute finding. Other: None. IMPRESSION: Evolving left MCA territory infarct involving the posterior left frontoparietal region and additional infarct in the right frontal corona radiata. No concerning mass effect. No acute intracranial hemorrhage. Underlying cerebral atrophy with unchanged mild sequela of chronic small vessel ischemic disease. Electronically Signed   By: Maurine Simmering M.D.   On: 03/07/2021 09:18   CT HEAD WO CONTRAST  Result Date: 03/05/2021 CLINICAL DATA:  Slurred speech.  Right arm weakness. EXAM:  CT HEAD WITHOUT CONTRAST TECHNIQUE: Contiguous axial images were obtained from the base of the skull through the vertex without intravenous contrast. COMPARISON:  02/24/2021 FINDINGS: Brain: Moderate low density in the periventricular white matter likely related to small vessel disease. Expected cerebral and cerebellar atrophy for age. Similar subtle hypoattenuation in the right insula likely due to remote infarct. No mass lesion, hemorrhage, hydrocephalus, acute infarct, intra-axial, or extra-axial fluid collection. Vascular: Intracranial atherosclerosis. No hyperdense vessel or unexpected calcification. Skull: Normal Sinuses/Orbits: Normal imaged portions of the orbits and globes. Clear paranasal sinuses and mastoid air cells. Other: None. IMPRESSION: No acute intracranial abnormality. Cerebral atrophy and small vessel ischemic change. Electronically Signed   By: Abigail Miyamoto M.D.   On: 03/05/2021 11:30   MR ANGIO HEAD WO CONTRAST  Result Date: 03/05/2021 CLINICAL DATA:  Initial evaluation for slurred speech, right arm weakness, stroke. EXAM: MRI HEAD WITHOUT CONTRAST MRA HEAD WITHOUT CONTRAST TECHNIQUE: Multiplanar, multi-echo pulse sequences of the brain and surrounding structures were acquired without intravenous contrast. Angiographic images of the Circle of Willis were acquired using MRA technique without intravenous contrast. COMPARISON:  Prior head CT from earlier the same day. FINDINGS: MRI HEAD FINDINGS Brain: Mild diffuse prominence of the CSF containing spaces compatible generalized age-related cerebral atrophy. Patchy and confluent T2/FLAIR hyperintensity involving the periventricular deep white matter both cerebral hemispheres as well as the pons, most consistent with chronic small vessel ischemic disease, mild in nature. Patchy areas of restricted diffusion involving the cortical and subcortical aspect of the posterior left frontal and parietal lobes consistent with acute left MCA territory  infarct (series 5, image 83). Mild patchy involvement of the left insula as well as the left pre and postcentral gyri. No associated hemorrhage or mass effect. There is an additional acute to early subacute ischemic infarct involving the right frontal corona radiata with extension towards the right insula (series 5, images 88, 83). Associated minimal petechial hemorrhage without frank hemorrhagic transformation or significant  mass effect. Otherwise, gray-white matter differentiation maintained with no other evidence for acute or subacute ischemia. No encephalomalacia to suggest chronic cortical infarction elsewhere within the brain. No other evidence for acute or chronic intracranial hemorrhage. No mass lesion, midline shift or mass effect. No hydrocephalus or extra-axial fluid collection. Pituitary gland suprasellar region within normal limits. Midline structures intact. Vascular: Major intracranial vascular flow voids are maintained. Skull and upper cervical spine: Craniocervical junction within normal limits. Bone marrow signal intensity normal. No scalp soft tissue abnormality. Sinuses/Orbits: Patient status post bilateral ocular lens replacement. Globes and orbital soft tissues demonstrate no acute finding. Paranasal sinuses are largely clear. No mastoid effusion. Inner ear structures grossly normal. Other: None. MRA HEAD FINDINGS Anterior circulation: Visualized distal cervical segments of both internal carotid arteries patent with antegrade flow. Distal cervical right ICA tortuous. Petrous segments patent bilaterally. Moderate atheromatous irregularity throughout the carotid siphons without high-grade stenosis. Severe stenosis noted at the mid left A1 segment (series 1041, image 5). A1 segments otherwise patent. Normal anterior communicating artery complex. Right ACA irregular but patent to its distal aspect without stenosis. Short-segment severe left A2 stenosis (a series 5, image 143). Left ACA patent  distally. Atheromatous irregularity within the M1 segments bilaterally without high-grade stenosis. Normal MCA bifurcations. Probable left M3 branch occlusion noted (series 5, image 164), in keeping with the acute left MCA distribution infarct. Moderate atheromatous change seen elsewhere throughout the distal MCA branches bilaterally. Posterior circulation: Both V4 segments patent to the vertebrobasilar junction without stenosis. Both PICA origins patent and normal. Basilar patent to its distal aspect without high-grade stenosis. Superior cerebellar arteries patent bilaterally. Both PCAs primarily supplied via the basilar. Atheromatous irregularity throughout both PCAs with associated moderate to severe bilateral distal P3/P4 stenoses. Anatomic variants: None significant.  No visible aneurysm. IMPRESSION: MRI HEAD IMPRESSION: 1. Patchy acute ischemic nonhemorrhagic left MCA territory infarct involving the posterior left frontoparietal region as above. No associated mass effect. 2. Additional acute to early subacute ischemic infarct involving the right frontal corona radiata. Associated minimal petechial hemorrhage without frank hemorrhagic transformation or significant mass effect. 3. Underlying age-related cerebral atrophy with mild chronic small vessel ischemic disease. MRA HEAD IMPRESSION: 1. Left M3 branch occlusion, in keeping with the acute left MCA territory infarct. 2. Moderate intracranial atherosclerotic change elsewhere throughout the intracranial circulation as above. Additional notable findings include severe left A1 and A2 stenoses, with moderate to severe bilateral distal P3 and P4 stenoses. Electronically Signed   By: Jeannine Boga M.D.   On: 03/05/2021 22:58   MR BRAIN WO CONTRAST  Result Date: 03/05/2021 CLINICAL DATA:  Initial evaluation for slurred speech, right arm weakness, stroke. EXAM: MRI HEAD WITHOUT CONTRAST MRA HEAD WITHOUT CONTRAST TECHNIQUE: Multiplanar, multi-echo pulse  sequences of the brain and surrounding structures were acquired without intravenous contrast. Angiographic images of the Circle of Willis were acquired using MRA technique without intravenous contrast. COMPARISON:  Prior head CT from earlier the same day. FINDINGS: MRI HEAD FINDINGS Brain: Mild diffuse prominence of the CSF containing spaces compatible generalized age-related cerebral atrophy. Patchy and confluent T2/FLAIR hyperintensity involving the periventricular deep white matter both cerebral hemispheres as well as the pons, most consistent with chronic small vessel ischemic disease, mild in nature. Patchy areas of restricted diffusion involving the cortical and subcortical aspect of the posterior left frontal and parietal lobes consistent with acute left MCA territory infarct (series 5, image 83). Mild patchy involvement of the left insula as well as the left pre and  postcentral gyri. No associated hemorrhage or mass effect. There is an additional acute to early subacute ischemic infarct involving the right frontal corona radiata with extension towards the right insula (series 5, images 88, 83). Associated minimal petechial hemorrhage without frank hemorrhagic transformation or significant mass effect. Otherwise, gray-white matter differentiation maintained with no other evidence for acute or subacute ischemia. No encephalomalacia to suggest chronic cortical infarction elsewhere within the brain. No other evidence for acute or chronic intracranial hemorrhage. No mass lesion, midline shift or mass effect. No hydrocephalus or extra-axial fluid collection. Pituitary gland suprasellar region within normal limits. Midline structures intact. Vascular: Major intracranial vascular flow voids are maintained. Skull and upper cervical spine: Craniocervical junction within normal limits. Bone marrow signal intensity normal. No scalp soft tissue abnormality. Sinuses/Orbits: Patient status post bilateral ocular lens  replacement. Globes and orbital soft tissues demonstrate no acute finding. Paranasal sinuses are largely clear. No mastoid effusion. Inner ear structures grossly normal. Other: None. MRA HEAD FINDINGS Anterior circulation: Visualized distal cervical segments of both internal carotid arteries patent with antegrade flow. Distal cervical right ICA tortuous. Petrous segments patent bilaterally. Moderate atheromatous irregularity throughout the carotid siphons without high-grade stenosis. Severe stenosis noted at the mid left A1 segment (series 1041, image 5). A1 segments otherwise patent. Normal anterior communicating artery complex. Right ACA irregular but patent to its distal aspect without stenosis. Short-segment severe left A2 stenosis (a series 5, image 143). Left ACA patent distally. Atheromatous irregularity within the M1 segments bilaterally without high-grade stenosis. Normal MCA bifurcations. Probable left M3 branch occlusion noted (series 5, image 164), in keeping with the acute left MCA distribution infarct. Moderate atheromatous change seen elsewhere throughout the distal MCA branches bilaterally. Posterior circulation: Both V4 segments patent to the vertebrobasilar junction without stenosis. Both PICA origins patent and normal. Basilar patent to its distal aspect without high-grade stenosis. Superior cerebellar arteries patent bilaterally. Both PCAs primarily supplied via the basilar. Atheromatous irregularity throughout both PCAs with associated moderate to severe bilateral distal P3/P4 stenoses. Anatomic variants: None significant.  No visible aneurysm. IMPRESSION: MRI HEAD IMPRESSION: 1. Patchy acute ischemic nonhemorrhagic left MCA territory infarct involving the posterior left frontoparietal region as above. No associated mass effect. 2. Additional acute to early subacute ischemic infarct involving the right frontal corona radiata. Associated minimal petechial hemorrhage without frank hemorrhagic  transformation or significant mass effect. 3. Underlying age-related cerebral atrophy with mild chronic small vessel ischemic disease. MRA HEAD IMPRESSION: 1. Left M3 branch occlusion, in keeping with the acute left MCA territory infarct. 2. Moderate intracranial atherosclerotic change elsewhere throughout the intracranial circulation as above. Additional notable findings include severe left A1 and A2 stenoses, with moderate to severe bilateral distal P3 and P4 stenoses. Electronically Signed   By: Jeannine Boga M.D.   On: 03/05/2021 22:58   VAS US CAROTID  Result Date: 03/06/2021 Carotid Arterial Duplex Study Patient Name:  TRANIYA PRICHETT  Date of Exam:   03/06/2021 Medical Rec #: [id number]           Accession #:    [id number] Date of Birth: 23-Aug-1929           Patient Gender: F Patient Age:   3 years Exam Location:  [facility] Procedure:      VAS US CAROTID Referring Phys: Alferd Patee [facility] --------------------------------------------------------------------------------  Indications:       CVA. Risk Factors:      Hyperlipidemia. Comparison Study:  No prior study Performing Technologist: Maudry Mayhew MHA, RDMS, RVT,  RDCS  Examination Guidelines: A complete evaluation includes B-mode imaging, spectral Doppler, color Doppler, and power Doppler as needed of all accessible portions of each vessel. Bilateral testing is considered an integral part of a complete examination. Limited examinations for reoccurring indications may be performed as noted.  Right Carotid Findings: +---------+--------+-------+--------+---------------------------------+--------+          PSV cm/sEDV    StenosisPlaque Description               Comments                  cm/s                                                     +---------+--------+-------+--------+---------------------------------+--------+ CCA Prox 67      10                                                        +---------+--------+-------+--------+---------------------------------+--------+ CCA      76      9              smooth and heterogenous                   Distal                                                                    +---------+--------+-------+--------+---------------------------------+--------+ ICA Prox 183     34             heterogenous, irregular and                                               calcific                                  +---------+--------+-------+--------+---------------------------------+--------+ ICA      148     31                                                       Distal                                                                    +---------+--------+-------+--------+---------------------------------+--------+ ECA      70  heterogenous and calcific                 +---------+--------+-------+--------+---------------------------------+--------+ +----------+--------+-------+----------------+-------------------+           PSV cm/sEDV cmsDescribe        Arm Pressure (mmHG) +----------+--------+-------+----------------+-------------------+ [id number]             Multiphasic, WNL                    +----------+--------+-------+----------------+-------------------+ +---------+--------+--+--------+--+---------+ VertebralPSV cm/s55EDV cm/s15Antegrade +---------+--------+--+--------+--+---------+  Left Carotid Findings: +----------+--------+-------+--------+--------------------------------+--------+           PSV cm/sEDV    StenosisPlaque Description              Comments                   cm/s                                                    +----------+--------+-------+--------+--------------------------------+--------+ CCA Prox  81      8              smooth and heterogenous                   +----------+--------+-------+--------+--------------------------------+--------+ CCA [id number]     8              smooth and heterogenous                  +----------+--------+-------+--------+--------------------------------+--------+ ICA Prox  88      13             smooth, heterogenous and                                                  calcific                                 +----------+--------+-------+--------+--------------------------------+--------+ ICA Distal86      19                                                      +----------+--------+-------+--------+--------------------------------+--------+ ECA       135                                                             +----------+--------+-------+--------+--------------------------------+--------+ +----------+--------+--------+----------------+-------------------+           PSV cm/sEDV cm/sDescribe        Arm Pressure (mmHG) +----------+--------+--------+----------------+-------------------+ Subclavian180             Multiphasic, WNL                    +----------+--------+--------+----------------+-------------------+ +---------+--------+--+--------+--+---------+ VertebralPSV cm/s43EDV cm/s10Antegrade +---------+--------+--+--------+--+---------+   Summary: Right Carotid: Velocities in the right ICA are consistent with an upper range  1-39% stenosis. Left Carotid: Velocities in the left ICA are consistent with a 1-39% stenosis. Vertebrals:  Bilateral vertebral arteries demonstrate antegrade flow. Subclavians: Normal flow hemodynamics were seen in bilateral subclavian              arteries. *See table(s) above for measurements and observations.  Electronically signed by Antony Contras MD on 03/06/2021 at 5:53:07 PM.    Final

## 2021-03-08 NOTE — Care Management Important Message (Signed)
Important Message  Patient Details  Name: Cathy Silva MRN: [id number] Date of Birth: 09-29-1929   Medicare Important Message Given:  Yes     Ambriella Kitt Montine Circle 03/08/2021, 2:33 PM

## 2021-03-08 NOTE — Consult Note (Addendum)
Consultation Note Date: 03/08/2021   Patient Name: Cathy Silva  DOB: 01-06-1930  MRN: [id number]  Age / Sex: 87 y.o., female  PCP: Cathy Amel, MD Referring Physician: Thurnell Lose, MD  Reason for Consultation: Establishing goals of care  HPI/Patient Profile: 87 y.o. female  with past medical history of HTN, anemia, hyponatremia, and recent admission for rhabdomyolysis after fall at home admitted on 03/05/2021 with stroke symptoms.  Presented with R sided weakness and slurred speech. MRI confirmed acute CVA. PMT consulted to discuss [place].   Clinical Assessment and Goals of Care: I have reviewed medical records including EPIC notes, labs and imaging, received report from nurse at bedside, assessed the patient and then met with patient  to discuss diagnosis prognosis, GOC, EOL wishes, disposition and options.  I introduced Palliative Medicine as specialized medical care for people living with serious illness. It focuses on providing relief from the symptoms and stress of a serious illness. The goal is to improve quality of life for both the patient and the family.  There was some concern about patient's confusion; however, on my assessment of her she can clearly articulate her history, reason for admission, and discuss goals of care appropriately.   We discussed a brief life review of the patient. She tells me about growing up in [place]. 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 She was married and she and her husband moved away from [place] many years ago to travel. He passed away >20 years ago and she has lived in [place] since. Her niece Cathy Silva or "Cathy Silva" is her local next of kin. She does have some living in-laws and nieces/nephews in [place]. She does not consider herself a religious person. She tells me about her love of travel.   As far as functional and nutritional status, she tells me she ws completely independent prior to her fall  last week. She shares that her appetite has not been great during this hospitalization.     We discussed patient's current illness and what it means in the larger context of patient's on-going co-morbidities.  Natural disease trajectory and expectations at EOL were discussed. She is quite frustrated with her physical limitations d/t her stroke. Tells me she is right handed and the loss of function of her right hand has been incredibly difficult.   I attempted to elicit values and goals of care important to the patient.    Advance directives, concepts specific to code status, artificial feeding and hydration, and rehospitalization were considered and discussed.  Cathy Silva tells me she is not interested in full code interventions - she tells me at her age she does not find this to be appropriate and does not think she would benefit from such interventions. I supported her decision and we discussed poor outcomes in similar hospitalized patients, as the cause of the arrest is likely associated with chronic/terminal disease rather than a reversible acute cardio-pulmonary event.   In addition, we also briefly spoke about feeding tubes. She tells me she would never want a feeding tube.   Discussed with patient the importance of continued conversation with family and the medical providers regarding overall plan of care and treatment options, ensuring decisions are within the context of the patient's values and GOCs.    We discussed surrogate decision makers - she shares that she is fine with her niece Cathy Silva making medical decisions for her however she feels as though Cathy Silva does not really know what she would want as  they have not had discussion about this in the past.   Palliative Care services outpatient were explained and offered. She agrees to outpatient palliative follow up. She agrees to dc to SNF.  Questions and concerns were addressed. The family was encouraged to call with questions or  concerns.   I did attempt to call patient's niece to provide update and share about patient's medical wishes however she did not answer at either number listed - voicemail was left with my call back number.  Primary Decision Maker PATIENT  NOK Cathy Silva if patient unable  SUMMARY OF RECOMMENDATIONS   - code status change to DNR - referral to outpatient palliative to see at SNF- requested TOC refer - patient would never want feeding tube - unable to complete MOST - okay with her niece making medical decisions for her as needed but would like to make them for herself as much as she is able  Addendum: patient's niece called back. Update provided. Code status change shared with her - she agrees with patient's wishes. She does share that patient's nieces/nephews from [place] are coming in tomorrow and they would like to complete advance directives at that time - have discussed with chaplain Cathy Silva who will plan to meet them at bedside.   Code Status/Advance Care Planning: DNR  Additional Recommendations (Limitations, Scope, Preferences): No Artificial Feeding and No Tracheostomy  Psycho-social/Spiritual:  Desire for further Chaplaincy support:no  Prognosis:  Unable to determine  Discharge Planning: [facility] for rehab with Palliative care service follow-up      Primary Diagnoses: Present on Admission:  Essential hypertension  Hyponatremia  Normocytic normochromic anemia  Acute CVA (cerebrovascular accident) ([place])   I have reviewed the medical record, interviewed the patient and family, and examined the patient. The following aspects are pertinent.  Past Medical History:  Diagnosis Date   Allergies    BCC (basal cell carcinoma), arm 2008   Left    Elevated cholesterol    Estrogen deficiency    Fracture    in back   Hx of scarlet fever    Hypercholesteremia    Hyponatremia    Insomnia    Low sodium levels    Melanoma in situ ([place]) 2009   Mitral valve  prolapse    Murmur    Psychosocial problem    Thrombocytosis    Social History   Socioeconomic History   Marital status: Widowed    Spouse name: Not on file   Number of children: Not on file   Years of education: Not on file   Highest education level: Not on file  Occupational History   Not on file  Tobacco Use   Smoking status: Never   Smokeless tobacco: Never  Substance and Sexual Activity   Alcohol use: No    Alcohol/week: 0.0 standard drinks   Drug use: No   Sexual activity: Not Currently    Birth control/protection: Surgical    Comment: TV[facility]  Other Topics Concern   Not on file  Social History Narrative   Not on file   Social Determinants of Health   Financial Resource Strain: Not on file  Food Insecurity: Not on file  Transportation Needs: Not on file  Physical Activity: Not on file  Stress: Not on file  Social Connections: Not on file   Family History  Problem Relation Age of Onset   Heart attack Maternal Grandmother    CAD Mother    Tuberculosis Father    Hemangiomas Brother  Scheduled Meds:  aspirin  81 mg Oral Daily   Or   aspirin  300 mg Rectal Daily   Chlorhexidine Gluconate Cloth  6 each Topical Daily   enoxaparin (LOVENOX) injection  30 mg Subcutaneous Q24H   feeding supplement  1 Container Oral Daily   multivitamin with minerals  1 tablet Oral Q breakfast   rosuvastatin  20 mg Oral Daily   tamsulosin  0.4 mg Oral Daily   Continuous Infusions: PRN Meds:.acetaminophen **OR** acetaminophen (TYLENOL) oral liquid 160 mg/5 mL **OR** acetaminophen, hydrALAZINE, senna-docusate Allergies  Allergen Reactions   Novocain [Procaine] Shortness Of Breath and Other (See Comments)    "Deathly allergic to this"   Codeine Other (See Comments)    Reaction not recalled by the patient- stated it was "a long time ago"   Pneumococcal Vaccines Other (See Comments)    Reaction not recalled by the patient- stated it was "a long time ago"   Pseudoephedrine Other  (See Comments)    Reaction not recalled by the patient- stated it was "a long time ago"   Tetanus Toxoids Swelling and Other (See Comments)    Swelling at injecton site   Tetanus-Diphtheria Toxoids Td Swelling and Other (See Comments)    Swelling where injected   Review of Systems  Constitutional:  Positive for activity change, appetite change and fatigue.  Respiratory:  Negative for shortness of breath.   Cardiovascular:  Negative for chest pain.  Neurological:  Positive for weakness.   Physical Exam Constitutional:      General: She is not in acute distress. Pulmonary:     Effort: Pulmonary effort is normal. No respiratory distress.  Musculoskeletal:     Right lower leg: No edema.     Left lower leg: No edema.  Skin:    General: Skin is warm and dry.  Neurological:     Mental Status: She is alert and oriented to person, place, and time.  Psychiatric:        Mood and Affect: Mood normal.        Behavior: Behavior normal.    Vital Signs: BP (!) 176/76 (BP Location: Left Arm)   Pulse 80   Temp 98.2 F (36.8 C) (Oral)   Resp 18   LMP 05/31/1987   SpO2 95%  Pain Scale: 0-10   Pain Score: 0-No pain   SpO2: SpO2: 95 % O2 Device:SpO2: 95 % O2 Flow Rate: .   IO: Intake/output summary:  Intake/Output Summary (Last 24 hours) at 03/08/2021 1104 Last data filed at 03/08/2021 0900 Gross per 24 hour  Intake 150 ml  Output 500 ml  Net -350 ml    LBM: Last BM Date:  (PTA) Baseline Weight:   Most recent weight:       Palliative Assessment/Data: PPS 40%    Time Total: 65 minutes Greater than 50%  of this time was spent counseling and coordinating care related to the above assessment and plan.  Juel Burrow, DNP, AGNP-C Palliative Medicine Team [phone] Pager: [phone]

## 2021-03-09 ENCOUNTER — Inpatient Hospital Stay (HOSPITAL_COMMUNITY): Payer: Medicare Other

## 2021-03-09 LAB — COMPREHENSIVE METABOLIC PANEL
ALT: 18 U/L (ref 0–44)
AST: 17 U/L (ref 15–41)
Albumin: 2.5 g/dL — ABNORMAL LOW (ref 3.5–5.0)
Alkaline Phosphatase: 49 U/L (ref 38–126)
Anion gap: 7 (ref 5–15)
BUN: 10 mg/dL (ref 8–23)
CO2: 24 mmol/L (ref 22–32)
Calcium: 8.2 mg/dL — ABNORMAL LOW (ref 8.9–10.3)
Chloride: 96 mmol/L — ABNORMAL LOW (ref 98–111)
Creatinine, Ser: 0.57 mg/dL (ref 0.44–1.00)
GFR, Estimated: >60 mL/min (ref >60)
Glucose, Bld: 104 mg/dL — ABNORMAL HIGH (ref 70–99)
Potassium: 4 mmol/L (ref 3.5–5.1)
Sodium: 127 mmol/L — ABNORMAL LOW (ref 135–145)
Total Bilirubin: 0.6 mg/dL (ref 0.3–1.2)
Total Protein: 5.1 g/dL — ABNORMAL LOW (ref 6.5–8.1)

## 2021-03-09 LAB — CBC WITH DIFFERENTIAL/PLATELET
Abs Immature Granulocytes: 0.05 10*3/uL (ref 0.00–0.07)
Basophils Absolute: 0.1 10*3/uL (ref 0.0–0.1)
Basophils Relative: 1 %
Eosinophils Absolute: 0.3 10*3/uL (ref 0.0–0.5)
Eosinophils Relative: 3 %
HCT: 32.4 % — ABNORMAL LOW (ref 36.0–46.0)
Hemoglobin: 10.8 g/dL — ABNORMAL LOW (ref 12.0–15.0)
Immature Granulocytes: 1 %
Lymphocytes Relative: 20 %
Lymphs Abs: 2.1 10*3/uL (ref 0.7–4.0)
MCH: 27.8 pg (ref 26.0–34.0)
MCHC: 33.3 g/dL (ref 30.0–36.0)
MCV: 83.3 fL (ref 80.0–100.0)
Monocytes Absolute: 1.3 10*3/uL — ABNORMAL HIGH (ref 0.1–1.0)
Monocytes Relative: 12 %
Neutro Abs: 6.8 10*3/uL (ref 1.7–7.7)
Neutrophils Relative %: 63 %
Platelets: 403 10*3/uL — ABNORMAL HIGH (ref 150–400)
RBC: 3.89 MIL/uL (ref 3.87–5.11)
RDW: 14.2 % (ref 11.5–15.5)
WBC: 10.5 10*3/uL (ref 4.0–10.5)
nRBC: 0 % (ref 0.0–0.2)

## 2021-03-09 LAB — SODIUM: Sodium: 131 mmol/L — ABNORMAL LOW (ref 135–145)

## 2021-03-09 LAB — BRAIN NATRIURETIC PEPTIDE: B Natriuretic Peptide: 192.9 pg/mL — ABNORMAL HIGH (ref 0.0–100.0)

## 2021-03-09 LAB — MAGNESIUM: Magnesium: 2 mg/dL (ref 1.7–2.4)

## 2021-03-09 LAB — GLUCOSE, CAPILLARY: Glucose-Capillary: 162 mg/dL — ABNORMAL HIGH (ref 70–99)

## 2021-03-09 MED ORDER — TOLVAPTAN 15 MG PO TABS
15.0000 mg | ORAL_TABLET | Freq: Once | ORAL | Status: AC
  Administered 2021-03-09: 15 mg via ORAL
  Filled 2021-03-09: qty 1

## 2021-03-09 NOTE — Progress Notes (Signed)
Called at 1900 regarding pt being lethargic, concern for left facial droop and aphasia. Upon my assessment at 1905, pt is oriented, drowsy. Face is symmetrical and she is not aphasic. See full NIH in flowsheets. CBG 162. VS T 98.74F, BP 126/57, HR 98, RR 20, SpO2 95% on room air. RN remains concerned about pt's slurred speech and drowsiness, stating this is different from her prior assessments. I requested she follow-up with neuro MD. Order received for STAT CT Head.    Ondre Salvetti L Rejoice [place] Rapid Response RN

## 2021-03-09 NOTE — Progress Notes (Signed)
PT Cancellation Note  Patient Details Name: Cathy Silva MRN: [id number] DOB: Oct 15, 1929   Cancelled Treatment:    Reason Eval/Treat Not Completed: (P) Other (comment) (Pts family having conference in room re: disposition and requesting to speak with social worker.) Will continue efforts per PT plan of care as schedule permits.   Kara Pacer [place] 03/09/2021, 4:26 PM

## 2021-03-09 NOTE — Progress Notes (Signed)
This chaplain responded to PMT consult for creating/updating the Pt. Advance Directive: HCPOA and Living Will.  The Pt. is alert and willing to participate in AD education.  The Pt. family is at the bedside.  The Pt. named Jolyn Nap as her healthcare agent.  If the healthcare agent is unwilling or unable to serve in this role, the Pt. names Bynum Bellows and Cherylann Ratel as her next choice in succession as healthcare agents.  This AD will supercede any other previous AD documents.  The chaplain gave the Pt. the original document and three copies.  A copy of the Pt. AD was scanned into EMR.  The chaplain updated the RN of the family's request for a F/U visit from SW and the placing of a DNR bracelet on the Pt.  The chaplain is available for F/U spiritual care as needed.  Chaplain Sallyanne Kuster [phone]

## 2021-03-09 NOTE — Plan of Care (Signed)
  Problem: Ischemic Stroke/TIA Tissue Perfusion: Goal: Complications of ischemic stroke/TIA will be minimized Outcome: Progressing   Problem: Education: Goal: Knowledge of General Education information will improve Description: Including pain rating scale, medication(s)/side effects and non-pharmacologic comfort measures Outcome: Progressing   Problem: Health Behavior/Discharge Planning: Goal: Ability to manage health-related needs will improve Outcome: Progressing   Problem: Clinical Measurements: Goal: Ability to maintain clinical measurements within normal limits will improve Outcome: Progressing Goal: Will remain free from infection Outcome: Progressing Goal: Diagnostic test results will improve Outcome: Progressing Goal: Respiratory complications will improve Outcome: Progressing Goal: Cardiovascular complication will be avoided Outcome: Progressing   Problem: Activity: Goal: Risk for activity intolerance will decrease Outcome: Progressing   Problem: Nutrition: Goal: Adequate nutrition will be maintained Outcome: Progressing   Problem: Coping: Goal: Level of anxiety will decrease Outcome: Progressing   Problem: Elimination: Goal: Will not experience complications related to bowel motility Outcome: Progressing Goal: Will not experience complications related to urinary retention Outcome: Progressing   Problem: Pain Managment: Goal: General experience of comfort will improve Outcome: Progressing   Problem: Safety: Goal: Ability to remain free from injury will improve Outcome: Progressing   Problem: Skin Integrity: Goal: Risk for impaired skin integrity will decrease Outcome: Progressing

## 2021-03-09 NOTE — Progress Notes (Signed)
PROGRESS NOTE                                                                                                                                                                                                             Patient Demographics:    Cathy Silva, is a 87 y.o. female, DOB - 1929-11-10, QQP:[id number]  Outpatient Primary MD for the patient is Koirala, Dibas, MD    LOS - 4  Admit date - 03/05/2021    Chief Complaint  Patient presents with   Weakness   Stroke Symptoms       Brief Narrative (HPI from H&P)   Cathy Silva is a 87 y.o. female with medical history significant for HTN, anemia, hyponatremia, recent admission for rhabdomyolysis after fall at home.  She was discharged last week to a skilled rehabilitation facility.  Her niece was visiting her every day on Saturday afternoon she noted that Ms. Mccammon was not acting normally and had slurred speech and right facial droop and she was not able to move or use her right arm or hand.     Subjective:   Patient in bed, appears comfortable, denies any headache, no fever, no chest pain or pressure, no shortness of breath , no abdominal pain,  R arm is weak.   Assessment  & Plan :     Acute metabolic encephalopathy with right-sided weakness and slurred speech due to acute CVA. - she has been seen by neurology team and full stroke work-up per neurology and stroke team.  Currently on aspirin, statin added for better LDL control, A1c stable.  She has significant weakness in her right arm and she is right-handed, will require placement as she lives alone, continue PT OT and supportive care, dysarthria seems to have improved on 03/08/2021.  2.  Dyslipidemia.  Placed on statin.  3.  Hyponatremia.  SIADH, worse after IV fluids, free water restriction, post Lasix 1 dose on 03/07/2021, [place] on 03/09/21.  Monitor  4.  Hypertension.  For now permissive hypertension due  to stroke, will add meds day 5.  5.  Urinary outflow obstruction present on admission.  Came in with Foley catheter along with Flomax.  Apparently she has had Foley for a number of weeks.  She will require urology follow-up outpatient post discharge  Condition - Extremely Guarded  Family Communication  :  message left for friend Cathy Silva [phone] 03/06/21 at 10.30 am, message left again on 03/07/2021 at 8:15 AM, called again and left a message on 03/07/2021 at 12:16 PM, updated 03/07/21 on the 4th call.  Code Status :  Full  Consults  :  Neuro  PUD Prophylaxis : None   Procedures  :     TTE - 02/25/21 -    1. Left ventricular ejection fraction, by estimation, is 60 to 65%. The left ventricle has normal function. The left ventricle has no regional wall motion abnormalities. There is severe concentric left ventricular hypertrophy. Indeterminate diastolic filling due to E-A fusion.   2. Right ventricular systolic function is normal. The right ventricular size is normal. Tricuspid regurgitation signal is inadequate for assessing PA pressure.   3. The mitral valve is degenerative. No evidence of mitral valve regurgitation. No evidence of mitral stenosis.   4. The aortic valve is calcified. There is severe calcifcation of the aortic valve. There is severe thickening of the aortic valve. Aortic valve regurgitation is not visualized. Moderate aortic valve stenosis. Aortic valve mean gradient measures 21.0 mmHg. Aortic valve Vmax measures 3.18 m/s.   5. The inferior vena cava is normal in size with <50% respiratory variability, suggesting right atrial pressure of 8 mmHg.   Vas. US Neck - Non Acute  MRI/A- IMPRESSION: 1. Patchy acute ischemic nonhemorrhagic left MCA territory infarct involving the posterior left frontoparietal region as above. No associated mass effect. 2. Additional acute to early subacute ischemic infarct involving the right frontal corona radiata. Associated minimal  petechial hemorrhage without frank hemorrhagic transformation or significant mass effect. 3. Underlying age-related cerebral atrophy with mild chronic small vessel ischemic disease. MRA HEAD IMPRESSION: 1. Left M3 branch occlusion, in keeping with the acute left MCA territory infarct. 2. Moderate intracranial atherosclerotic change elsewhere throughout the intracranial circulation as above. Additional notable findings include severe left A1 and A2 stenoses, with moderate to severe bilateral distal P3 and P4 stenoses      Disposition Plan  :    Status is: Inpatient  Remains inpatient appropriate because: CVA- needs SNF  DVT Prophylaxis  :    enoxaparin (LOVENOX) injection 30 mg Start: 03/06/21 1200 SCD's Start: 03/05/21 2225 Lovenox    Lab Results  Component Value Date   PLT 403 (H) 03/09/2021    Diet :  Diet Order             Diet Heart Room service appropriate? Yes; Fluid consistency: Thin; Fluid restriction: 1200 mL Fluid  Diet effective now                    Inpatient Medications  Scheduled Meds:  aspirin  81 mg Oral Daily   Or   aspirin  300 mg Rectal Daily   Chlorhexidine Gluconate Cloth  6 each Topical Daily   enoxaparin (LOVENOX) injection  30 mg Subcutaneous Q24H   feeding supplement  1 Container Oral Daily   multivitamin with minerals  1 tablet Oral Q breakfast   rosuvastatin  20 mg Oral Daily   tamsulosin  0.4 mg Oral Daily   Continuous Infusions:   PRN Meds:.acetaminophen **OR** acetaminophen (TYLENOL) oral liquid 160 mg/5 mL **OR** acetaminophen, hydrALAZINE, senna-docusate  Antibiotics  :    Anti-infectives (From admission, onward)    None        Time Spent in minutes  30   Lala Lund M.D on 03/09/2021  at 10:48 AM  To page go to [email or web address]   [facility] -  Office  [phone]  See all Orders from today for further details    Objective:   Vitals:   03/08/21 1915 03/08/21 2315 03/09/21 0400 03/09/21 0900  BP:  112/78 (!) 98/41 (!) 163/64 (!) 129/54  Pulse: 90 78 84 82  Resp: 19 14 19    Temp: 98.6 F (37 C) 97.7 F (36.5 C) 98.5 F (36.9 C) 97.6 F (36.4 C)  TempSrc: Oral Oral Oral Oral  SpO2: 99% 97% 98% 97%    Wt Readings from Last 3 Encounters:  03/05/21 68.8 kg  03/01/21 68.8 kg  02/24/21 63.5 kg     Intake/Output Summary (Last 24 hours) at 03/09/2021 1048 Last data filed at 03/09/2021 1000 Gross per 24 hour  Intake 595 ml  Output 550 ml  Net 45 ml     Physical Exam  Awake , alert  ++ R. Arm >> leg weakness, Foley in place [place].AT,PERRAL Supple Neck, No JVD,   Symmetrical Chest wall movement, Good air movement bilaterally, CTAB RRR,No Gallops, Rubs or new Murmurs,  +ve B.Sounds, Abd Soft, No tenderness,   No Cyanosis, Clubbing or edema         Data Review:    CBC Recent Labs  Lab 03/05/21 1025 03/05/21 1039 03/07/21 0329 03/08/21 0420 03/09/21 0239  WBC 9.5  --  9.9 10.1 10.5  HGB 11.4* 10.9* 10.6* 11.3* 10.8*  HCT 33.1* 32.0* 31.0* 33.3* 32.4*  PLT 369  --  376 389 403*  MCV 81.7  --  81.8 82.2 83.3  MCH 28.1  --  28.0 27.9 27.8  MCHC 34.4  --  34.2 33.9 33.3  RDW 14.1  --  14.0 14.1 14.2  LYMPHSABS 1.3  --  1.9 1.7 2.1  MONOABS 1.4*  --  1.2* 1.2* 1.3*  EOSABS 0.1  --  0.3 0.3 0.3  BASOSABS 0.1  --  0.1 0.1 0.1    Electrolytes Recent Labs  Lab 03/05/21 1025 03/05/21 1039 03/05/21 1853 03/06/21 0258 03/07/21 0329 03/08/21 0420 03/09/21 0239  NA 126* 127*  --  126* 125* 127* 127*  K 3.5 3.7  --  3.6 3.6 3.7 4.0  CL 95* 94*  --  95* 96* 96* 96*  CO2 23  --   --  23 23 24 24   GLUCOSE 119* 121*  --  102* 101* 103* 104*  BUN 8 7*  --  6* 8 7* 10  CREATININE 0.57 0.50  --  0.54 0.54 0.52 0.57  CALCIUM 8.2*  --   --  8.0* 8.0* 7.9* 8.2*  AST 24  --   --   --  17 19 17   ALT 31  --   --   --  23 21 18   ALKPHOS 47  --   --   --  45 52 49  BILITOT 1.1  --   --   --  0.9 1.0 0.6  ALBUMIN 2.6*  --   --   --  2.4* 2.4* 2.5*  MG  --   --  1.8  --   1.6* 2.1 2.0  INR 1.0  --   --   --   --   --   --   TSH  --   --  2.867  --   --   --   --   HGBA1C  --   --   --  5.6  --   --   --  BNP  --   --   --   --  277.1* 294.4* 192.9*    ------------------------------------------------------------------------------------------------------------------ No results for input(s): CHOL, HDL, LDLCALC, TRIG, CHOLHDL, LDLDIRECT in the last 72 hours.   Lab Results  Component Value Date   HGBA1C 5.6 03/06/2021    No results for input(s): TSH, T4TOTAL, T3FREE, THYROIDAB in the last 72 hours.  Invalid input(s): [place]    Radiology Reports CT HEAD WO CONTRAST (5MM)  Result Date: 03/07/2021 CLINICAL DATA:  Stroke, follow up EXAM: CT HEAD WITHOUT CONTRAST TECHNIQUE: Contiguous axial images were obtained from the base of the skull through the vertex without intravenous contrast. COMPARISON:  MRI 03/05/2021, head CT 03/05/2021 FINDINGS: Brain: There is mild loss of gray-white matter differentiation in the left MCA territory in the posterior left frontal and the parietal lobe consistent with infarct, as seen on MRI. Hypoattenuation in the right frontal corona radiata extending towards the insula also consistent with infarct. These findings are increased in conspicuity in comparison to prior CT.No concerning mass effect. No acute intracranial hemorrhage. The basal cisterns are patent.The ventricles are unchanged in size.Scattered subcortical and periventricular white matter hypodensities, nonspecific but likely sequela of chronic small vessel ischemic disease.Mild cerebral atrophy Vascular: Vertebral artery calcifications. Skull: Normal. Negative for fracture or focal lesion. Sinuses/Orbits: No acute finding. Other: None. IMPRESSION: Evolving left MCA territory infarct involving the posterior left frontoparietal region and additional infarct in the right frontal corona radiata. No concerning mass effect. No acute intracranial hemorrhage. Underlying cerebral  atrophy with unchanged mild sequela of chronic small vessel ischemic disease. Electronically Signed   By: Maurine Simmering M.D.   On: 03/07/2021 09:18   CT HEAD WO CONTRAST  Result Date: 03/05/2021 CLINICAL DATA:  Slurred speech.  Right arm weakness. EXAM: CT HEAD WITHOUT CONTRAST TECHNIQUE: Contiguous axial images were obtained from the base of the skull through the vertex without intravenous contrast. COMPARISON:  02/24/2021 FINDINGS: Brain: Moderate low density in the periventricular white matter likely related to small vessel disease. Expected cerebral and cerebellar atrophy for age. Similar subtle hypoattenuation in the right insula likely due to remote infarct. No mass lesion, hemorrhage, hydrocephalus, acute infarct, intra-axial, or extra-axial fluid collection. Vascular: Intracranial atherosclerosis. No hyperdense vessel or unexpected calcification. Skull: Normal Sinuses/Orbits: Normal imaged portions of the orbits and globes. Clear paranasal sinuses and mastoid air cells. Other: None. IMPRESSION: No acute intracranial abnormality. Cerebral atrophy and small vessel ischemic change. Electronically Signed   By: Abigail Miyamoto M.D.   On: 03/05/2021 11:30   MR ANGIO HEAD WO CONTRAST  Result Date: 03/05/2021 CLINICAL DATA:  Initial evaluation for slurred speech, right arm weakness, stroke. EXAM: MRI HEAD WITHOUT CONTRAST MRA HEAD WITHOUT CONTRAST TECHNIQUE: Multiplanar, multi-echo pulse sequences of the brain and surrounding structures were acquired without intravenous contrast. Angiographic images of the Circle of Willis were acquired using MRA technique without intravenous contrast. COMPARISON:  Prior head CT from earlier the same day. FINDINGS: MRI HEAD FINDINGS Brain: Mild diffuse prominence of the CSF containing spaces compatible generalized age-related cerebral atrophy. Patchy and confluent T2/FLAIR hyperintensity involving the periventricular deep white matter both cerebral hemispheres as well as the  pons, most consistent with chronic small vessel ischemic disease, mild in nature. Patchy areas of restricted diffusion involving the cortical and subcortical aspect of the posterior left frontal and parietal lobes consistent with acute left MCA territory infarct (series 5, image 83). Mild patchy involvement of the left insula as well as the left pre and postcentral gyri.  No associated hemorrhage or mass effect. There is an additional acute to early subacute ischemic infarct involving the right frontal corona radiata with extension towards the right insula (series 5, images 88, 83). Associated minimal petechial hemorrhage without frank hemorrhagic transformation or significant mass effect. Otherwise, gray-white matter differentiation maintained with no other evidence for acute or subacute ischemia. No encephalomalacia to suggest chronic cortical infarction elsewhere within the brain. No other evidence for acute or chronic intracranial hemorrhage. No mass lesion, midline shift or mass effect. No hydrocephalus or extra-axial fluid collection. Pituitary gland suprasellar region within normal limits. Midline structures intact. Vascular: Major intracranial vascular flow voids are maintained. Skull and upper cervical spine: Craniocervical junction within normal limits. Bone marrow signal intensity normal. No scalp soft tissue abnormality. Sinuses/Orbits: Patient status post bilateral ocular lens replacement. Globes and orbital soft tissues demonstrate no acute finding. Paranasal sinuses are largely clear. No mastoid effusion. Inner ear structures grossly normal. Other: None. MRA HEAD FINDINGS Anterior circulation: Visualized distal cervical segments of both internal carotid arteries patent with antegrade flow. Distal cervical right ICA tortuous. Petrous segments patent bilaterally. Moderate atheromatous irregularity throughout the carotid siphons without high-grade stenosis. Severe stenosis noted at the mid left A1 segment  (series 1041, image 5). A1 segments otherwise patent. Normal anterior communicating artery complex. Right ACA irregular but patent to its distal aspect without stenosis. Short-segment severe left A2 stenosis (a series 5, image 143). Left ACA patent distally. Atheromatous irregularity within the M1 segments bilaterally without high-grade stenosis. Normal MCA bifurcations. Probable left M3 branch occlusion noted (series 5, image 164), in keeping with the acute left MCA distribution infarct. Moderate atheromatous change seen elsewhere throughout the distal MCA branches bilaterally. Posterior circulation: Both V4 segments patent to the vertebrobasilar junction without stenosis. Both PICA origins patent and normal. Basilar patent to its distal aspect without high-grade stenosis. Superior cerebellar arteries patent bilaterally. Both PCAs primarily supplied via the basilar. Atheromatous irregularity throughout both PCAs with associated moderate to severe bilateral distal P3/P4 stenoses. Anatomic variants: None significant.  No visible aneurysm. IMPRESSION: MRI HEAD IMPRESSION: 1. Patchy acute ischemic nonhemorrhagic left MCA territory infarct involving the posterior left frontoparietal region as above. No associated mass effect. 2. Additional acute to early subacute ischemic infarct involving the right frontal corona radiata. Associated minimal petechial hemorrhage without frank hemorrhagic transformation or significant mass effect. 3. Underlying age-related cerebral atrophy with mild chronic small vessel ischemic disease. MRA HEAD IMPRESSION: 1. Left M3 branch occlusion, in keeping with the acute left MCA territory infarct. 2. Moderate intracranial atherosclerotic change elsewhere throughout the intracranial circulation as above. Additional notable findings include severe left A1 and A2 stenoses, with moderate to severe bilateral distal P3 and P4 stenoses. Electronically Signed   By: Jeannine Boga M.D.   On:  03/05/2021 22:58   MR BRAIN WO CONTRAST  Result Date: 03/05/2021 CLINICAL DATA:  Initial evaluation for slurred speech, right arm weakness, stroke. EXAM: MRI HEAD WITHOUT CONTRAST MRA HEAD WITHOUT CONTRAST TECHNIQUE: Multiplanar, multi-echo pulse sequences of the brain and surrounding structures were acquired without intravenous contrast. Angiographic images of the Circle of Willis were acquired using MRA technique without intravenous contrast. COMPARISON:  Prior head CT from earlier the same day. FINDINGS: MRI HEAD FINDINGS Brain: Mild diffuse prominence of the CSF containing spaces compatible generalized age-related cerebral atrophy. Patchy and confluent T2/FLAIR hyperintensity involving the periventricular deep white matter both cerebral hemispheres as well as the pons, most consistent with chronic small vessel ischemic disease, mild in nature. Patchy  areas of restricted diffusion involving the cortical and subcortical aspect of the posterior left frontal and parietal lobes consistent with acute left MCA territory infarct (series 5, image 83). Mild patchy involvement of the left insula as well as the left pre and postcentral gyri. No associated hemorrhage or mass effect. There is an additional acute to early subacute ischemic infarct involving the right frontal corona radiata with extension towards the right insula (series 5, images 88, 83). Associated minimal petechial hemorrhage without frank hemorrhagic transformation or significant mass effect. Otherwise, gray-white matter differentiation maintained with no other evidence for acute or subacute ischemia. No encephalomalacia to suggest chronic cortical infarction elsewhere within the brain. No other evidence for acute or chronic intracranial hemorrhage. No mass lesion, midline shift or mass effect. No hydrocephalus or extra-axial fluid collection. Pituitary gland suprasellar region within normal limits. Midline structures intact. Vascular: Major  intracranial vascular flow voids are maintained. Skull and upper cervical spine: Craniocervical junction within normal limits. Bone marrow signal intensity normal. No scalp soft tissue abnormality. Sinuses/Orbits: Patient status post bilateral ocular lens replacement. Globes and orbital soft tissues demonstrate no acute finding. Paranasal sinuses are largely clear. No mastoid effusion. Inner ear structures grossly normal. Other: None. MRA HEAD FINDINGS Anterior circulation: Visualized distal cervical segments of both internal carotid arteries patent with antegrade flow. Distal cervical right ICA tortuous. Petrous segments patent bilaterally. Moderate atheromatous irregularity throughout the carotid siphons without high-grade stenosis. Severe stenosis noted at the mid left A1 segment (series 1041, image 5). A1 segments otherwise patent. Normal anterior communicating artery complex. Right ACA irregular but patent to its distal aspect without stenosis. Short-segment severe left A2 stenosis (a series 5, image 143). Left ACA patent distally. Atheromatous irregularity within the M1 segments bilaterally without high-grade stenosis. Normal MCA bifurcations. Probable left M3 branch occlusion noted (series 5, image 164), in keeping with the acute left MCA distribution infarct. Moderate atheromatous change seen elsewhere throughout the distal MCA branches bilaterally. Posterior circulation: Both V4 segments patent to the vertebrobasilar junction without stenosis. Both PICA origins patent and normal. Basilar patent to its distal aspect without high-grade stenosis. Superior cerebellar arteries patent bilaterally. Both PCAs primarily supplied via the basilar. Atheromatous irregularity throughout both PCAs with associated moderate to severe bilateral distal P3/P4 stenoses. Anatomic variants: None significant.  No visible aneurysm. IMPRESSION: MRI HEAD IMPRESSION: 1. Patchy acute ischemic nonhemorrhagic left MCA territory infarct  involving the posterior left frontoparietal region as above. No associated mass effect. 2. Additional acute to early subacute ischemic infarct involving the right frontal corona radiata. Associated minimal petechial hemorrhage without frank hemorrhagic transformation or significant mass effect. 3. Underlying age-related cerebral atrophy with mild chronic small vessel ischemic disease. MRA HEAD IMPRESSION: 1. Left M3 branch occlusion, in keeping with the acute left MCA territory infarct. 2. Moderate intracranial atherosclerotic change elsewhere throughout the intracranial circulation as above. Additional notable findings include severe left A1 and A2 stenoses, with moderate to severe bilateral distal P3 and P4 stenoses. Electronically Signed   By: Jeannine Boga M.D.   On: 03/05/2021 22:58   VAS US CAROTID  Result Date: 03/06/2021 Carotid Arterial Duplex Study Patient Name:  KEISHIA GROUND  Date of Exam:   03/06/2021 Medical Rec #: [id number]           Accession #:    [id number] Date of Birth: [birth year]           Patient Gender: F Patient Age:   71 years Exam Location:  [facility] Procedure:  VAS US CAROTID Referring Phys: [facility] --------------------------------------------------------------------------------  Indications:       CVA. Risk Factors:      Hyperlipidemia. Comparison Study:  No prior study Performing Technologist: Maudry Mayhew MHA, RDMS, RVT, RDCS  Examination Guidelines: A complete evaluation includes B-mode imaging, spectral Doppler, color Doppler, and power Doppler as needed of all accessible portions of each vessel. Bilateral testing is considered an integral part of a complete examination. Limited examinations for reoccurring indications may be performed as noted.  Right Carotid Findings: +---------+--------+-------+--------+---------------------------------+--------+          PSV cm/sEDV    StenosisPlaque Description               Comments                   cm/s                                                     +---------+--------+-------+--------+---------------------------------+--------+ CCA Prox 67      10                                                       +---------+--------+-------+--------+---------------------------------+--------+ CCA      76      9              smooth and heterogenous                   Distal                                                                    +---------+--------+-------+--------+---------------------------------+--------+ ICA Prox 183     34             heterogenous, irregular and                                               calcific                                  +---------+--------+-------+--------+---------------------------------+--------+ ICA      148     31                                                       Distal                                                                    +---------+--------+-------+--------+---------------------------------+--------+  ECA      70                     heterogenous and calcific                 +---------+--------+-------+--------+---------------------------------+--------+ +----------+--------+-------+----------------+-------------------+           PSV cm/sEDV cmsDescribe        Arm Pressure (mmHG) +----------+--------+-------+----------------+-------------------+ [id number]             Multiphasic, WNL                    +----------+--------+-------+----------------+-------------------+ +---------+--------+--+--------+--+---------+ VertebralPSV cm/s55EDV cm/s15Antegrade +---------+--------+--+--------+--+---------+  Left Carotid Findings: +----------+--------+-------+--------+--------------------------------+--------+           PSV cm/sEDV    StenosisPlaque Description              Comments                   cm/s                                                     +----------+--------+-------+--------+--------------------------------+--------+ CCA Prox  81      8              smooth and heterogenous                  +----------+--------+-------+--------+--------------------------------+--------+ CCA [id number]     8              smooth and heterogenous                  +----------+--------+-------+--------+--------------------------------+--------+ ICA Prox  88      13             smooth, heterogenous and                                                  calcific                                 +----------+--------+-------+--------+--------------------------------+--------+ ICA Distal86      19                                                      +----------+--------+-------+--------+--------------------------------+--------+ ECA       135                                                             +----------+--------+-------+--------+--------------------------------+--------+ +----------+--------+--------+----------------+-------------------+           PSV cm/sEDV cm/sDescribe        Arm Pressure (mmHG) +----------+--------+--------+----------------+-------------------+ Subclavian180             Multiphasic, WNL                    +----------+--------+--------+----------------+-------------------+ +---------+--------+--+--------+--+---------+  VertebralPSV cm/s43EDV cm/s10Antegrade +---------+--------+--+--------+--+---------+   Summary: Right Carotid: Velocities in the right ICA are consistent with an upper range                1-39% stenosis. Left Carotid: Velocities in the left ICA are consistent with a 1-39% stenosis. Vertebrals:  Bilateral vertebral arteries demonstrate antegrade flow. Subclavians: Normal flow hemodynamics were seen in bilateral subclavian              arteries. *See table(s) above for measurements and observations.  Electronically signed by Antony Contras MD on 03/06/2021 at 5:53:07 PM.    Final

## 2021-03-09 NOTE — TOC Progression Note (Addendum)
Transition of Care [facility]) - Progression Note    Patient Details  Name: Cathy Silva MRN: [id number] Date of Birth: 1930-03-31  Transition of Care [facility]) CM/SW Contact  Cathy Chars, LCSW Phone Number: 03/09/2021, 11:21 AM  Clinical Narrative:   CSW informed by RN that pt is aware of out of town relatives coming from [place] for a visit and is stating she does not want this visit to happen.  CSW called Cathy Silva who reports that she and these relatives are currently en route to the hospital.  She agreed to have the relatives wait and she will come in and speak with pt about this.   1330: Bed offers presented to pt and niece Cathy Silva in room.  They will discuss, asked for decision this afternoon of possible.   1535: Stopped back by to follow up about bed choice.  Chaplain meeting with family, unable to speak with them at this time.      Expected Discharge Plan: [place] Barriers to Discharge: Continued Medical Work up, SNF Pending bed offer  Expected Discharge Plan and Services Expected Discharge Plan: [place] Choice: [place] arrangements for the past 2 months: Single Family Home                                       Social Determinants of Health (SDOH) Interventions    Readmission Risk Interventions No flowsheet data found.

## 2021-03-09 NOTE — Consult Note (Signed)
   [facility] CM Inpatient Consult   03/09/2021  Cathy Silva Sep 02, 1929 [id number]  [place] [facility] [ACO] Patient: Medicare CMS DCE  Primary Care Provider:  [name], MD , [place], [place]   Patient screened for less than 7 days readmission hospitalization with noted for potential [place] Management service needs for post hospital transition.  Review of patient's medical record reveals patient is being recommended for a skilled nursing facility level of care for transition per review of PT/OT and inpatient [facility] team. Came by and a family meeting was in progress.  Plan:  Continue to follow progress and disposition to assess for post hospital care management needs.  Patient post hospital needs are to be met at that level of care for transition  For questions contact:   Natividad Brood, RN BSN [place] [facility] Liaison  [phone] business mobile phone Toll free office [phone]  Fax number: [phone] [name]

## 2021-03-10 LAB — COMPREHENSIVE METABOLIC PANEL
ALT: 22 U/L (ref 0–44)
AST: 21 U/L (ref 15–41)
Albumin: 2.4 g/dL — ABNORMAL LOW (ref 3.5–5.0)
Alkaline Phosphatase: 52 U/L (ref 38–126)
Anion gap: 7 (ref 5–15)
BUN: 12 mg/dL (ref 8–23)
CO2: 25 mmol/L (ref 22–32)
Calcium: 8.4 mg/dL — ABNORMAL LOW (ref 8.9–10.3)
Chloride: 98 mmol/L (ref 98–111)
Creatinine, Ser: 0.58 mg/dL (ref 0.44–1.00)
GFR, Estimated: >60 mL/min (ref >60)
Glucose, Bld: 124 mg/dL — ABNORMAL HIGH (ref 70–99)
Potassium: 4.3 mmol/L (ref 3.5–5.1)
Sodium: 130 mmol/L — ABNORMAL LOW (ref 135–145)
Total Bilirubin: 0.8 mg/dL (ref 0.3–1.2)
Total Protein: 5.3 g/dL — ABNORMAL LOW (ref 6.5–8.1)

## 2021-03-10 LAB — CBC WITH DIFFERENTIAL/PLATELET
Abs Immature Granulocytes: 0.06 10*3/uL (ref 0.00–0.07)
Basophils Absolute: 0.1 10*3/uL (ref 0.0–0.1)
Basophils Relative: 1 %
Eosinophils Absolute: 0.1 10*3/uL (ref 0.0–0.5)
Eosinophils Relative: 1 %
HCT: 34.4 % — ABNORMAL LOW (ref 36.0–46.0)
Hemoglobin: 11.5 g/dL — ABNORMAL LOW (ref 12.0–15.0)
Immature Granulocytes: 0 %
Lymphocytes Relative: 11 %
Lymphs Abs: 1.6 10*3/uL (ref 0.7–4.0)
MCH: 27.8 pg (ref 26.0–34.0)
MCHC: 33.4 g/dL (ref 30.0–36.0)
MCV: 83.3 fL (ref 80.0–100.0)
Monocytes Absolute: 1.6 10*3/uL — ABNORMAL HIGH (ref 0.1–1.0)
Monocytes Relative: 11 %
Neutro Abs: 11.5 10*3/uL — ABNORMAL HIGH (ref 1.7–7.7)
Neutrophils Relative %: 76 %
Platelets: 435 10*3/uL — ABNORMAL HIGH (ref 150–400)
RBC: 4.13 MIL/uL (ref 3.87–5.11)
RDW: 14.3 % (ref 11.5–15.5)
WBC: 14.9 10*3/uL — ABNORMAL HIGH (ref 4.0–10.5)
nRBC: 0 % (ref 0.0–0.2)

## 2021-03-10 LAB — BRAIN NATRIURETIC PEPTIDE: B Natriuretic Peptide: 201 pg/mL — ABNORMAL HIGH (ref 0.0–100.0)

## 2021-03-10 LAB — MAGNESIUM: Magnesium: 2 mg/dL (ref 1.7–2.4)

## 2021-03-10 MED ORDER — HALOPERIDOL LACTATE 5 MG/ML IJ SOLN
0.5000 mg | Freq: Once | INTRAMUSCULAR | Status: DC
  Filled 2021-03-10 (×2): qty 1

## 2021-03-10 MED ORDER — TOLVAPTAN 15 MG PO TABS
15.0000 mg | ORAL_TABLET | Freq: Once | ORAL | Status: AC
  Administered 2021-03-10: 15 mg via ORAL
  Filled 2021-03-10: qty 1

## 2021-03-10 NOTE — Care Management (Signed)
Referral made to outpatient palliative

## 2021-03-10 NOTE — Progress Notes (Signed)
PROGRESS NOTE                                                                                                                                                                                                             Patient Demographics:    Cathy Silva, is a 87 y.o. female, DOB - 05-01-1929, PQD:[id number]  Outpatient Primary MD for the patient is Koirala, Dibas, MD    LOS - 5  Admit date - 03/05/2021    Chief Complaint  Patient presents with   Weakness   Stroke Symptoms       Brief Narrative (HPI from H&P)   Cathy Silva is a 87 y.o. female with medical history significant for HTN, anemia, hyponatremia, recent admission for rhabdomyolysis after fall at home.  She was discharged last week to a skilled rehabilitation facility.  Her niece was visiting her every day on Saturday afternoon she noted that Ms. Gorden was not acting normally and had slurred speech and right facial droop and she was not able to move or use her right arm or hand.     Subjective:   Patient in bed appears to be in no distress, wants some help with her breakfast, denies any headache or chest pain, belly feels full, R arm is weak.   Assessment  & Plan :     Acute metabolic encephalopathy with right-sided weakness and slurred speech due to acute CVA. - she has been seen by neurology team and full stroke work-up per neurology and stroke team.  Currently on aspirin, statin added for better LDL control, A1c stable.  She has significant weakness in her right arm and she is right-handed, will require placement as she lives alone, continue PT OT and supportive care, dysarthria seems to have improved on 03/08/2021.  2.  Dyslipidemia.  Placed on statin.  3.  Hyponatremia.  SIADH, worse after IV fluids, free water restriction, post Lasix 1 dose on 03/07/2021, Sam[place]a on 03/09/21, sodium better will repeat on 03/10/2021.  4.  Hypertension.  For now  permissive hypertension due to stroke, will add meds day 5.  5.  Urinary outflow obstruction present on admission.  Came in with Foley catheter along with Flomax.  This was placed about 2 weeks ago according to family, trial of discontinuation was done on 03/09/2021 but she failed, she is  retaining again will replace Foley with outpatient urology follow-up.         Condition - Extremely Guarded  Family Communication  : Updated patient's niece [name] [phone] on 03/07/2021, 03/09/2021, also family members on 03/09/2021  Code Status :  Full  Consults  :  Neuro  PUD Prophylaxis : None   Procedures  :     TTE - 02/25/21 -    1. Left ventricular ejection fraction, by estimation, is 60 to 65%. The left ventricle has normal function. The left ventricle has no regional wall motion abnormalities. There is severe concentric left ventricular hypertrophy. Indeterminate diastolic filling due to E-A fusion.   2. Right ventricular systolic function is normal. The right ventricular size is normal. Tricuspid regurgitation signal is inadequate for assessing PA pressure.   3. The mitral valve is degenerative. No evidence of mitral valve regurgitation. No evidence of mitral stenosis.   4. The aortic valve is calcified. There is severe calcifcation of the aortic valve. There is severe thickening of the aortic valve. Aortic valve regurgitation is not visualized. Moderate aortic valve stenosis. Aortic valve mean gradient measures 21.0 mmHg. Aortic valve Vmax measures 3.18 m/s.   5. The inferior vena cava is normal in size with <50% respiratory variability, suggesting right atrial pressure of 8 mmHg.   Vas. US Neck - Non Acute  MRI/A- IMPRESSION: 1. Patchy acute ischemic nonhemorrhagic left MCA territory infarct involving the posterior left frontoparietal region as above. No associated mass effect. 2. Additional acute to early subacute ischemic infarct involving the right frontal corona radiata.  Associated minimal petechial hemorrhage without frank hemorrhagic transformation or significant mass effect. 3. Underlying age-related cerebral atrophy with mild chronic small vessel ischemic disease. MRA HEAD IMPRESSION: 1. Left M3 branch occlusion, in keeping with the acute left MCA territory infarct. 2. Moderate intracranial atherosclerotic change elsewhere throughout the intracranial circulation as above. Additional notable findings include severe left A1 and A2 stenoses, with moderate to severe bilateral distal P3 and P4 stenoses      Disposition Plan  :    Status is: Inpatient  Remains inpatient appropriate because: CVA- needs SNF  DVT Prophylaxis  :    enoxaparin (LOVENOX) injection 30 mg Start: 03/06/21 1200 SCD's Start: 03/05/21 2225 Lovenox    Lab Results  Component Value Date   PLT 435 (H) 03/10/2021    Diet :  Diet Order             Diet Heart Room service appropriate? Yes; Fluid consistency: Thin; Fluid restriction: 1200 mL Fluid  Diet effective now                    Inpatient Medications  Scheduled Meds:  aspirin  81 mg Oral Daily   Or   aspirin  300 mg Rectal Daily   Chlorhexidine Gluconate Cloth  6 each Topical Daily   enoxaparin (LOVENOX) injection  30 mg Subcutaneous Q24H   feeding supplement  1 Container Oral Daily   multivitamin with minerals  1 tablet Oral Q breakfast   rosuvastatin  20 mg Oral Daily   tamsulosin  0.4 mg Oral Daily   Continuous Infusions:   PRN Meds:.acetaminophen **OR** acetaminophen (TYLENOL) oral liquid 160 mg/5 mL **OR** acetaminophen, hydrALAZINE, senna-docusate  Antibiotics  :    Anti-infectives (From admission, onward)    None        Time Spent in minutes  30   Lala Lund M.D on 03/10/2021 at 11:33 AM  To page  go to [email or web address]   [facility] -  Office  [phone]  See all Orders from today for further details    Objective:   Vitals:   03/09/21 2000 03/09/21 2330 03/10/21 0400  03/10/21 0733  BP: (!) 139/54 (!) 153/76 (!) 160/67 (!) 169/78  Pulse: 90  95 85  Resp: 20 19 18 18   Temp: 98.2 F (36.8 C)  98.3 F (36.8 C) 98.1 F (36.7 C)  TempSrc: Oral  Oral Oral  SpO2: 96% 98% 96% 97%    Wt Readings from Last 3 Encounters:  03/05/21 68.8 kg  03/01/21 68.8 kg  02/24/21 63.5 kg     Intake/Output Summary (Last 24 hours) at 03/10/2021 1133 Last data filed at 03/10/2021 0700 Gross per 24 hour  Intake --  Output 700 ml  Net -700 ml     Physical Exam  Awake , alert  ++ R. Arm >> leg weakness, Foley in place [place].AT,PERRAL Supple Neck, No JVD,   Symmetrical Chest wall movement, Good air movement bilaterally, CTAB RRR,No Gallops, Rubs or new Murmurs,  +ve B.Sounds, Abd Soft, No tenderness,   No Cyanosis, Clubbing or edema      Data Review:    CBC Recent Labs  Lab 03/05/21 1025 03/05/21 1039 03/07/21 0329 03/08/21 0420 03/09/21 0239 03/10/21 0330  WBC 9.5  --  9.9 10.1 10.5 14.9*  HGB 11.4* 10.9* 10.6* 11.3* 10.8* 11.5*  HCT 33.1* 32.0* 31.0* 33.3* 32.4* 34.4*  PLT 369  --  376 389 403* 435*  MCV 81.7  --  81.8 82.2 83.3 83.3  MCH 28.1  --  28.0 27.9 27.8 27.8  MCHC 34.4  --  34.2 33.9 33.3 33.4  RDW 14.1  --  14.0 14.1 14.2 14.3  LYMPHSABS 1.3  --  1.9 1.7 2.1 1.6  MONOABS 1.4*  --  1.2* 1.2* 1.3* 1.6*  EOSABS 0.1  --  0.3 0.3 0.3 0.1  BASOSABS 0.1  --  0.1 0.1 0.1 0.1    Electrolytes Recent Labs  Lab 03/05/21 1025 03/05/21 1039 03/05/21 1853 03/06/21 0258 03/07/21 0329 03/08/21 0420 03/09/21 0239 03/09/21 1643 03/10/21 0330  NA 126*   < >  --  126* 125* 127* 127* 131* 130*  K 3.5   < >  --  3.6 3.6 3.7 4.0  --  4.3  CL 95*   < >  --  95* 96* 96* 96*  --  98  CO2 23  --   --  23 23 24 24   --  25  GLUCOSE 119*   < >  --  102* 101* 103* 104*  --  124*  BUN 8   < >  --  6* 8 7* 10  --  12  CREATININE 0.57   < >  --  0.54 0.54 0.52 0.57  --  0.58  CALCIUM 8.2*  --   --  8.0* 8.0* 7.9* 8.2*  --  8.4*  AST 24  --   --   --   17 19 17   --  21  ALT 31  --   --   --  23 21 18   --  22  ALKPHOS 47  --   --   --  45 52 49  --  52  BILITOT 1.1  --   --   --  0.9 1.0 0.6  --  0.8  ALBUMIN 2.6*  --   --   --  2.4* 2.4* 2.5*  --  2.4*  MG  --   --  1.8  --  1.6* 2.1 2.0  --  2.0  INR 1.0  --   --   --   --   --   --   --   --   TSH  --   --  2.867  --   --   --   --   --   --   HGBA1C  --   --   --  5.6  --   --   --   --   --   BNP  --   --   --   --  277.1* 294.4* 192.9*  --  201.0*   < > = values in this interval not displayed.    ------------------------------------------------------------------------------------------------------------------ No results for input(s): CHOL, HDL, LDLCALC, TRIG, CHOLHDL, LDLDIRECT in the last 72 hours.   Lab Results  Component Value Date   HGBA1C 5.6 03/06/2021    No results for input(s): TSH, T4TOTAL, T3FREE, THYROIDAB in the last 72 hours.  Invalid input(s): [place]    Radiology Reports CT HEAD WO CONTRAST (5MM)  Result Date: 03/09/2021 CLINICAL DATA:  Cerebral hemorrhage suspected Stroke-like symptoms.  Recent infarct on MRI. EXAM: CT HEAD WITHOUT CONTRAST TECHNIQUE: Contiguous axial images were obtained from the base of the skull through the vertex without intravenous contrast. COMPARISON:  Head CT 03/07/2021, brain MRI 03/05/2021. FINDINGS: Brain: No acute intracranial hemorrhage. Expected evolution of recent bilateral infarcts with developing encephalomalacia. No hemorrhagic transformation. No evidence of new ischemia. No subdural or extra-axial collection. Generalized atrophy and periventricular chronic small vessel ischemia is again seen. No hydrocephalus, midline shift, or mass effect. Vascular: Skull base atherosclerosis without hyperdense vessel. No suspicious calcification. Skull: No fracture or focal lesion. Sinuses/Orbits: Paranasal sinuses and mastoid air cells are clear. The visualized orbits are unremarkable. Bilateral cataract resection Other: None. IMPRESSION: 1.  Expected evolution of recent bilateral infarcts with developing encephalomalacia. No hemorrhagic transformation or evidence of new ischemia. 2. Stable atrophy and chronic small vessel ischemia. Electronically Signed   By: Keith Rake M.D.   On: 03/09/2021 20:13   CT HEAD WO CONTRAST (5MM)  Result Date: 03/07/2021 CLINICAL DATA:  Stroke, follow up EXAM: CT HEAD WITHOUT CONTRAST TECHNIQUE: Contiguous axial images were obtained from the base of the skull through the vertex without intravenous contrast. COMPARISON:  MRI 03/05/2021, head CT 03/05/2021 FINDINGS: Brain: There is mild loss of gray-white matter differentiation in the left MCA territory in the posterior left frontal and the parietal lobe consistent with infarct, as seen on MRI. Hypoattenuation in the right frontal corona radiata extending towards the insula also consistent with infarct. These findings are increased in conspicuity in comparison to prior CT.No concerning mass effect. No acute intracranial hemorrhage. The basal cisterns are patent.The ventricles are unchanged in size.Scattered subcortical and periventricular white matter hypodensities, nonspecific but likely sequela of chronic small vessel ischemic disease.Mild cerebral atrophy Vascular: Vertebral artery calcifications. Skull: Normal. Negative for fracture or focal lesion. Sinuses/Orbits: No acute finding. Other: None. IMPRESSION: Evolving left MCA territory infarct involving the posterior left frontoparietal region and additional infarct in the right frontal corona radiata. No concerning mass effect. No acute intracranial hemorrhage. Underlying cerebral atrophy with unchanged mild sequela of chronic small vessel ischemic disease. Electronically Signed   By: Maurine Simmering M.D.   On: 03/07/2021 09:18

## 2021-03-10 NOTE — Progress Notes (Signed)
[facility]) Hospital Liaison Note  Notified by Carles Collet, RN [facility] manager of patient/family request for [facility] Palliative services at home after discharge.  [facility] hospital liaison will follow patient for discharge disposition.  Please call with any hospice or outpatient palliative care related questions.  Thank you for the opportunity to participate in this patient's care.  Bobbie "Loren Racer, [place], [place] [phone]

## 2021-03-10 NOTE — Progress Notes (Signed)
Foley cath no. 14 inserted without difficulty,Immediately drained about 550 ml of clear ,yellow urine. Patient tolerated procedure well.

## 2021-03-10 NOTE — Progress Notes (Signed)
Physical Therapy Treatment Patient Details Name: Cathy Silva MRN: [id number] DOB: April 01, 1930 Today's Date: 03/10/2021   History of Present Illness Pt is an 87 y/o female admitted from SNF on 11/7 secondary to R arm weakness. Found to have L MCA infarct. Concern for new stroke-like symptoms on 11/11 and CTH showing no hemorrhagic transformation or evidence of new ischemia. PMH includes HTN.    PT Comments    Pt received in supine, agreeable to therapy session with encouragement. Pt limited this date due to lethargy and pain but agreeable to transfer training and seated activity. Emphasis on safe body mechanics for transfer training with R PF RW, self-assist with weaker side with bed mobility and positioning toward weaker side post-stroke with RUE elevated and pillow between knees for comfort. Pt continues to benefit from PT services to progress toward functional mobility goals.    Recommendations for follow up therapy are one component of a multi-disciplinary discharge planning process, led by the attending physician.  Recommendations may be updated based on patient status, additional functional criteria and insurance authorization.  Follow Up Recommendations  Skilled nursing-short term rehab (<3 hours/day)     Assistance Recommended at Discharge Frequent or constant Supervision/Assistance  Equipment Recommendations  None recommended by PT (Defer to post acute)    Recommendations for Other Services       Precautions / Restrictions Precautions Precautions: Fall Precaution Comments: RUE/LE hemiplegia and decreased sensation, HoH with HA, chronic foley Restrictions Weight Bearing Restrictions: No     Mobility  Bed Mobility Overal bed mobility: Needs Assistance Bed Mobility: Rolling;Sidelying to Sit;Sit to Sidelying Rolling: Mod assist Sidelying to sit: Max assist;+2 for physical assistance     Sit to sidelying: Max assist;+2 for physical assistance General bed mobility  comments: dense cues, increased time to initiate, assist at trunk and BLE to perform    Transfers Overall transfer level: Needs assistance Equipment used: Right platform walker Transfers: Sit to/from Stand Sit to Stand: +2 physical assistance;Max assist           General transfer comment: From chair to EOB, pt with heavy R lean upon standing, needs manual assist to place and keep RUE on platform, unable to weight shift for steps due to fatigue/lethargy        Modified Rankin (Stroke Patients Only) Modified Rankin (Stroke Patients Only) Pre-Morbid Rankin Score: Moderate disability Modified Rankin: Severe disability     Balance Overall balance assessment: Needs assistance Sitting-balance support: No upper extremity supported;Feet supported Sitting balance-Leahy Scale: Poor Sitting balance - Comments: R lateral lean needs cues for midline posture Postural control: Right lateral lean Standing balance support: Bilateral upper extremity supported;Reliant on assistive device for balance Standing balance-Leahy Scale: Zero Standing balance comment: +1-2 maxA for static standing with heavy R lean within R PF RW        Cognition Arousal/Alertness: Lethargic;Suspect due to medications Behavior During Therapy: [facility] for tasks assessed/performed;Anxious Overall Cognitive Status: Impaired/Different from baseline Area of Impairment: Memory;Problem solving;Following commands         Memory: Decreased short-term memory Following Commands: Follows one step commands with increased time     Problem Solving: Slow processing;Decreased initiation;Requires verbal cues;Difficulty sequencing;Requires tactile cues General Comments: Pt with decreased processing speed today and uncomfortable throughout, had Haldol ~1 hr prior to session which may be related to lethargy, also had prolonged catheter insertion issues and pt more flat/drowsy throughout session. needs dense multimodal cues for  initiating each task.        Exercises  General Comments General comments (skin integrity, edema, etc.): positioned in semi-sidelying toward weaker R side and pillows between legs with RUE forearm/hand elevated; NT notified to check on her/assist her to roll in 1-2 hours      Pertinent Vitals/Pain Pain Assessment: Faces Faces Pain Scale: Hurts even more Pain Location: "all over", pt seems to express pain with all functional mobility tasks, bruises noted on B forearms and L knee (also present previous session) Pain Descriptors / Indicators: Grimacing;Discomfort;Moaning Pain Intervention(s): Limited activity within patient's tolerance;Monitored during session;Repositioned;Other (comment) (pt may benefit from Tylenol, RN notified (had Haldol ~1 hr prior))     PT Goals (current goals can now be found in the care plan section) Acute Rehab PT Goals Patient Stated Goal: to be able to walk PT Goal Formulation: With patient Time For Goal Achievement: 03/20/21 Progress towards PT goals: Progressing toward goals (slow progress today; lethargic)    Frequency    Min 3X/week      PT Plan Current plan remains appropriate    AM-PAC PT "6 Clicks" Mobility   Outcome Measure  Help needed turning from your back to your side while in a flat bed without using bedrails?: A Lot Help needed moving from lying on your back to sitting on the side of a flat bed without using bedrails?: Total Help needed moving to and from a bed to a chair (including a wheelchair)?: Total Help needed standing up from a chair using your arms (e.g., wheelchair or bedside chair)?: Total Help needed to walk in hospital room?: Total Help needed climbing 3-5 steps with a railing? : Total 6 Click Score: 7    End of Session Equipment Utilized During Treatment: Gait belt Activity Tolerance: Patient limited by lethargy;Patient limited by fatigue;Patient limited by pain Patient left: in bed;with bed alarm set;with call  bell/phone within reach;with SCD's reapplied Nurse Communication: Mobility status;Need for lift equipment (Use +2 with stedy or mechanical lift for OOB today; may need Tylenol?) PT Visit Diagnosis: Muscle weakness (generalized) (M62.81);Unsteadiness on feet (R26.81);Hemiplegia and hemiparesis Hemiplegia - Right/Left: Right Hemiplegia - caused by: Cerebral infarction     Time: 8127-5170 PT Time Calculation (min) (ACUTE ONLY): 31 min  Charges:  $Therapeutic Activity: 8-22 mins                     Aleana Fifita P., PTA Acute Rehabilitation Services Pager: [phone] Office: [place] 03/10/2021, 3:58 PM

## 2021-03-10 NOTE — Progress Notes (Signed)
Occupational Therapy Treatment Patient Details Name: Cathy Silva MRN: [id number] DOB: 06-17-1929 Today's Date: 03/10/2021   History of present illness Pt is an 87 y/o female admitted from SNF on 11/7 secondary to R arm weakness. Found to have L MCA infarct. Concern for new stroke-like symptoms on 11/11 and CTH showing no hemorrhagic transformation or evidence of new ischemia. PMH includes HTN.   OT comments  Pt making incremental progress this session. Pt was limited by increased lethargy, generalized pain, overall weakness. She required max A +2 to stand and attempt to take a step this session, using a platform walker to assist her weaker R side. Pt requiring encouragement and verbal cuing throughout.    Recommendations for follow up therapy are one component of a multi-disciplinary discharge planning process, led by the attending physician.  Recommendations may be updated based on patient status, additional functional criteria and insurance authorization.    Follow Up Recommendations  Skilled nursing-short term rehab (<3 hours/day)    Assistance Recommended at Discharge Frequent or constant Supervision/Assistance  Equipment Recommendations  None recommended by OT    Recommendations for Other Services      Precautions / Restrictions Precautions Precautions: Fall Precaution Comments: RUE/LE hemiplegia and decreased sensation, HoH with HA, chronic foley Restrictions Weight Bearing Restrictions: No       Mobility Bed Mobility Overal bed mobility: Needs Assistance Bed Mobility: Rolling;Sidelying to Sit;Sit to Sidelying Rolling: Mod assist Sidelying to sit: Max assist;+2 for physical assistance     Sit to sidelying: Max assist;+2 for physical assistance General bed mobility comments: dense cues, increased time to initiate, assist at trunk and BLE to perform    Transfers Overall transfer level: Needs assistance Equipment used: Right platform walker Transfers: Sit  to/from Stand Sit to Stand: +2 physical assistance;Max assist           General transfer comment: From chair to EOB, pt with heavy R lean upon standing, needs manual assist to place and keep RUE on platform, unable to weight shift for steps due to fatigue/lethargy     Balance Overall balance assessment: Needs assistance Sitting-balance support: No upper extremity supported;Feet supported Sitting balance-Leahy Scale: Poor Sitting balance - Comments: R lateral lean needs cues for midline posture Postural control: Right lateral lean Standing balance support: Bilateral upper extremity supported;Reliant on assistive device for balance Standing balance-Leahy Scale: Zero Standing balance comment: +1-2 maxA for static standing with heavy R lean within R PF RW                           ADL either performed or assessed with clinical judgement   ADL Overall ADL's : Needs assistance/impaired     Grooming: Minimal assistance;Sitting                   Toilet Transfer: Maximal assistance;+2 for physical assistance;+2 for safety/equipment;Stand-pivot Toilet Transfer Details (indicate cue type and reason): Pt with increased difficulty/pain/fatigue in standing this session. Toileting- Clothing Manipulation and Hygiene: Maximal assistance;+2 for physical assistance;+2 for safety/equipment;Sitting/lateral lean;Sit to/from stand Toileting - Clothing Manipulation Details (indicate cue type and reason): PT very weak unable to tolerate standing for any extended period of time       General ADL Comments: Pt limited by pain, fatigue, and weakness this session, having difficutly with all mobility    Extremity/Trunk Assessment              Vision  Perception     Praxis      Cognition Arousal/Alertness: Lethargic;Suspect due to medications Behavior During Therapy: [facility] for tasks assessed/performed;Anxious Overall Cognitive Status: Impaired/Different from baseline Area  of Impairment: Memory;Problem solving;Following commands                     Memory: Decreased short-term memory Following Commands: Follows one step commands with increased time     Problem Solving: Slow processing;Decreased initiation;Requires verbal cues;Difficulty sequencing;Requires tactile cues General Comments: Pt with decreased processing speed today and uncomfortable throughout, had Haldol ~1 hr prior to session which may be related to lethargy, also had prolonged catheter insertion issues and pt more flat/drowsy throughout session. needs dense multimodal cues for initiating each task.          Exercises     Shoulder Instructions       General Comments positioned in semi-sidelying toward weaker R side and pillows between legs with RUE forearm/hand elevated; NT notified to check on her/assist her to roll in 1-2 hours    Pertinent Vitals/ Pain       Pain Assessment: Faces Faces Pain Scale: Hurts even more Pain Location: "all over", pt seems to express pain with all functional mobility tasks, bruises noted on B forearms and L knee (also present previous session) Pain Descriptors / Indicators: Grimacing;Discomfort;Moaning Pain Intervention(s): Limited activity within patient's tolerance;Monitored during session;Repositioned;Patient requesting pain meds-RN notified  Home Living                                          Prior Functioning/Environment              Frequency  Min 2X/week        Progress Toward Goals  OT Goals(current goals can now be found in the care plan section)  Progress towards OT goals: Progressing toward goals  Acute Rehab OT Goals Patient Stated Goal: To get stronger OT Goal Formulation: With patient Time For Goal Achievement: 03/21/21 Potential to Achieve Goals: Good ADL Goals Pt Will Perform Eating: with modified independence;with adaptive utensils;sitting Pt Will Perform Grooming: with modified  independence;with adaptive equipment;sitting Pt Will Perform Upper Body Bathing: with modified independence;with adaptive equipment;sitting Pt Will Perform Lower Body Bathing: with min assist;sitting/lateral leans;sit to/from stand;with adaptive equipment Pt Will Perform Upper Body Dressing: with set-up;sitting Pt Will Perform Lower Body Dressing: with min assist;sitting/lateral leans;sit to/from stand Pt Will Transfer to Toilet: with min assist;stand pivot transfer Pt Will Perform Toileting - Clothing Manipulation and hygiene: with min assist;sitting/lateral leans;sit to/from stand;with adaptive equipment  Plan Discharge plan remains appropriate;Frequency remains appropriate    Co-evaluation    PT/OT/SLP Co-Evaluation/Treatment: Yes Reason for Co-Treatment: Complexity of the patient's impairments (multi-system involvement);Necessary to address cognition/behavior during functional activity;For patient/therapist safety;To address functional/ADL transfers   OT goals addressed during session: ADL's and self-care;Proper use of Adaptive equipment and DME;Strengthening/ROM      AM-PAC OT "6 Clicks" Daily Activity     Outcome Measure   Help from another person eating meals?: A Little Help from another person taking care of personal grooming?: A Little Help from another person toileting, which includes using toliet, bedpan, or urinal?: A Lot Help from another person bathing (including washing, rinsing, drying)?: A Lot Help from another person to put on and taking off regular upper body clothing?: A Lot Help from another person to put on and taking  off regular lower body clothing?: A Lot 6 Click Score: 14    End of Session Equipment Utilized During Treatment: Gait belt;Rolling walker (2 wheels)  OT Visit Diagnosis: Unsteadiness on feet (R26.81);Other abnormalities of gait and mobility (R26.89);Muscle weakness (generalized) (M62.81);Hemiplegia and hemiparesis Hemiplegia - Right/Left:  Right Hemiplegia - dominant/non-dominant: Dominant Hemiplegia - caused by: Cerebral infarction   Activity Tolerance Patient limited by lethargy   Patient Left in chair;with call bell/phone within reach;with chair alarm set   Nurse Communication Mobility status        Time: 1458-1530 OT Time Calculation (min): 32 min  Charges: OT General Charges $OT Visit: 1 Visit OT Treatments $Therapeutic Activity: 8-22 mins  Caydence Enck H., OTR/L Acute Rehabilitation  Zurich Carreno Elane Tiffine Henigan 03/10/2021, 5:04 PM

## 2021-03-10 NOTE — Plan of Care (Signed)
  Problem: Ischemic Stroke/TIA Tissue Perfusion: Goal: Complications of ischemic stroke/TIA will be minimized Outcome: Progressing   Problem: Education: Goal: Knowledge of General Education information will improve Description: Including pain rating scale, medication(s)/side effects and non-pharmacologic comfort measures Outcome: Progressing   Problem: Health Behavior/Discharge Planning: Goal: Ability to manage health-related needs will improve Outcome: Progressing   Problem: Clinical Measurements: Goal: Ability to maintain clinical measurements within normal limits will improve Outcome: Progressing Goal: Will remain free from infection Outcome: Progressing Goal: Diagnostic test results will improve Outcome: Progressing Goal: Respiratory complications will improve Outcome: Progressing Goal: Cardiovascular complication will be avoided Outcome: Progressing   Problem: Activity: Goal: Risk for activity intolerance will decrease Outcome: Progressing   Problem: Nutrition: Goal: Adequate nutrition will be maintained Outcome: Progressing   Problem: Coping: Goal: Level of anxiety will decrease Outcome: Progressing   Problem: Elimination: Goal: Will not experience complications related to bowel motility Outcome: Progressing Goal: Will not experience complications related to urinary retention Outcome: Progressing   Problem: Pain Managment: Goal: General experience of comfort will improve Outcome: Progressing   Problem: Safety: Goal: Ability to remain free from injury will improve Outcome: Progressing   Problem: Skin Integrity: Goal: Risk for impaired skin integrity will decrease Outcome: Progressing   Problem: Education: Goal: Knowledge of disease or condition will improve Outcome: Progressing Goal: Knowledge of secondary prevention will improve (SELECT ALL) Outcome: Progressing Goal: Knowledge of patient specific risk factors will improve (INDIVIDUALIZE FOR  PATIENT) Outcome: Progressing Goal: Individualized Educational Video(s) Outcome: Progressing   Problem: Coping: Goal: Will verbalize positive feelings about self Outcome: Progressing Goal: Will identify appropriate support needs Outcome: Progressing   Problem: Health Behavior/Discharge Planning: Goal: Ability to manage health-related needs will improve Outcome: Progressing   Problem: Self-Care: Goal: Ability to participate in self-care as condition permits will improve Outcome: Progressing Goal: Verbalization of feelings and concerns over difficulty with self-care will improve Outcome: Progressing Goal: Ability to communicate needs accurately will improve Outcome: Progressing   Problem: Nutrition: Goal: Risk of aspiration will decrease Outcome: Progressing Goal: Dietary intake will improve Outcome: Progressing   Problem: Ischemic Stroke/TIA Tissue Perfusion: Goal: Complications of ischemic stroke/TIA will be minimized Outcome: Progressing

## 2021-03-10 NOTE — TOC Progression Note (Signed)
Transition of Care [facility]) - Progression Note    Patient Details  Name: Cathy Silva MRN: [id number] Date of Birth: 1929-10-20  Transition of Care [facility]) CM/SW [place], [place] Phone Number: 03/10/2021, 11:53 AM  Clinical Narrative:    CSW followed up with pt's Niece who confirmed they have chosen a facility. They would like for pt to go to [facility] when she is ready to DC. CSW attempted to update weekend admissions, unable to leave VM. TOC will continue to follow for DC needs.   Expected Discharge Plan: [place] Barriers to Discharge: Continued Medical Work up, SNF Pending bed offer  Expected Discharge Plan and Services Expected Discharge Plan: [place] Choice: [place] arrangements for the past 2 months: Single Family Home                                       Social Determinants of Health (SDOH) Interventions    Readmission Risk Interventions No flowsheet data found.

## 2021-03-11 ENCOUNTER — Inpatient Hospital Stay (HOSPITAL_COMMUNITY): Payer: Medicare Other

## 2021-03-11 DIAGNOSIS — I639 Cerebral infarction, unspecified: Secondary | ICD-10-CM

## 2021-03-11 DIAGNOSIS — I63413 Cerebral infarction due to embolism of bilateral middle cerebral arteries: Secondary | ICD-10-CM

## 2021-03-11 LAB — BASIC METABOLIC PANEL
Anion gap: 7 (ref 5–15)
BUN: 10 mg/dL (ref 8–23)
CO2: 25 mmol/L (ref 22–32)
Calcium: 8.5 mg/dL — ABNORMAL LOW (ref 8.9–10.3)
Chloride: 100 mmol/L (ref 98–111)
Creatinine, Ser: 0.56 mg/dL (ref 0.44–1.00)
GFR, Estimated: >60 mL/min (ref >60)
Glucose, Bld: 120 mg/dL — ABNORMAL HIGH (ref 70–99)
Potassium: 4.1 mmol/L (ref 3.5–5.1)
Sodium: 132 mmol/L — ABNORMAL LOW (ref 135–145)

## 2021-03-11 LAB — VITAMIN B12: Vitamin B-12: 807 pg/mL (ref 180–914)

## 2021-03-11 LAB — URINALYSIS, ROUTINE W REFLEX MICROSCOPIC
Bilirubin Urine: NEGATIVE
Glucose, UA: NEGATIVE mg/dL
Ketones, ur: NEGATIVE mg/dL
Leukocytes,Ua: NEGATIVE
Nitrite: NEGATIVE
Protein, ur: NEGATIVE mg/dL
Specific Gravity, Urine: 1.023 (ref 1.005–1.030)
pH: 6 (ref 5.0–8.0)

## 2021-03-11 LAB — CBC
HCT: 32.7 % — ABNORMAL LOW (ref 36.0–46.0)
Hemoglobin: 11 g/dL — ABNORMAL LOW (ref 12.0–15.0)
MCH: 28.1 pg (ref 26.0–34.0)
MCHC: 33.6 g/dL (ref 30.0–36.0)
MCV: 83.6 fL (ref 80.0–100.0)
Platelets: 428 10*3/uL — ABNORMAL HIGH (ref 150–400)
RBC: 3.91 MIL/uL (ref 3.87–5.11)
RDW: 14.4 % (ref 11.5–15.5)
WBC: 11.1 10*3/uL — ABNORMAL HIGH (ref 4.0–10.5)
nRBC: 0 % (ref 0.0–0.2)

## 2021-03-11 LAB — AMMONIA: Ammonia: <10 umol/L (ref 9–35)

## 2021-03-11 LAB — MAGNESIUM: Magnesium: 2 mg/dL (ref 1.7–2.4)

## 2021-03-11 LAB — GLUCOSE, CAPILLARY: Glucose-Capillary: 144 mg/dL — ABNORMAL HIGH (ref 70–99)

## 2021-03-11 MED ORDER — SODIUM CHLORIDE 0.9 % IV SOLN: INTRAVENOUS | Status: DC

## 2021-03-11 NOTE — Progress Notes (Signed)
PROGRESS NOTE                                                                                                                                                                                                             Patient Demographics:    Cathy Silva, is a 87 y.o. female, DOB - 1929-05-05, ZOX:[id number]  Outpatient Primary MD for the patient is Koirala, Dibas, MD    LOS - 6  Admit date - 03/05/2021    Chief Complaint  Patient presents with   Weakness   Stroke Symptoms       Brief Narrative (HPI from H&P)   Cathy Silva is a 87 y.o. female with medical history significant for HTN, anemia, hyponatremia, recent admission for rhabdomyolysis after fall at home.  She was discharged last week to a skilled rehabilitation facility.  Her niece was visiting her every day on Saturday afternoon she noted that Cathy Silva was not acting normally and had slurred speech and right facial droop and she was not able to move or use her right arm or hand.     Subjective:   Patient in bed, appears comfortable, denies any headache, no fever, no chest pain or pressure, no shortness of breath , no abdominal pain. No new focal weakness, R arm is weak.   Assessment  & Plan :     Acute metabolic encephalopathy with right-sided weakness and slurred speech due to acute CVA. - she has been seen by neurology team and full stroke work-up per neurology and stroke team.  Currently on aspirin, statin added for better LDL control, A1c stable.  She has significant weakness in her right arm and she is right-handed, will require placement as she lives alone, continue PT OT and supportive care, dysarthria seems to have improved on 03/08/2021.  2.  Dyslipidemia.  Placed on statin.  3.  Hyponatremia.  SIADH, worse after IV fluids, free water restriction, post Lasix 1 dose on 03/07/2021, Samsca on 03/09/21, sodium better will repeat on  03/10/2021.  4.  Hypertension.  For now permissive hypertension due to stroke, will add meds day 5.  5.  Urinary outflow obstruction present on admission.  Came in with Foley catheter along with Flomax.  This was placed about 2 weeks ago according to family, trial of Foley removal failed on 03/10/2021 and it  was replaced on 03/10/2021.         Condition - Extremely Guarded  Family Communication  : Updated patient's niece Cathy Silva [phone] on 03/07/2021, 03/09/2021, also family members on 03/09/2021  Code Status :  Full  Consults  :  Neuro  PUD Prophylaxis : None   Procedures  :     TTE - 02/25/21 -    1. Left ventricular ejection fraction, by estimation, is 60 to 65%. The left ventricle has normal function. The left ventricle has no regional wall motion abnormalities. There is severe concentric left ventricular hypertrophy. Indeterminate diastolic filling due to E-A fusion.   2. Right ventricular systolic function is normal. The right ventricular size is normal. Tricuspid regurgitation signal is inadequate for assessing PA pressure.   3. The mitral valve is degenerative. No evidence of mitral valve regurgitation. No evidence of mitral stenosis.   4. The aortic valve is calcified. There is severe calcifcation of the aortic valve. There is severe thickening of the aortic valve. Aortic valve regurgitation is not visualized. Moderate aortic valve stenosis. Aortic valve mean gradient measures 21.0 mmHg. Aortic valve Vmax measures 3.18 m/s.   5. The inferior vena cava is normal in size with <50% respiratory variability, suggesting right atrial pressure of 8 mmHg.   Vas. US Neck - Non Acute  MRI/A- IMPRESSION: 1. Patchy acute ischemic nonhemorrhagic left MCA territory infarct involving the posterior left frontoparietal region as above. No associated mass effect. 2. Additional acute to early subacute ischemic infarct involving the right frontal corona radiata. Associated minimal petechial  hemorrhage without frank hemorrhagic transformation or significant mass effect. 3. Underlying age-related cerebral atrophy with mild chronic small vessel ischemic disease. MRA HEAD IMPRESSION: 1. Left M3 branch occlusion, in keeping with the acute left MCA territory infarct. 2. Moderate intracranial atherosclerotic change elsewhere throughout the intracranial circulation as above. Additional notable findings include severe left A1 and A2 stenoses, with moderate to severe bilateral distal P3 and P4 stenoses      Disposition Plan  :    Status is: Inpatient  Remains inpatient appropriate because: CVA- needs SNF  DVT Prophylaxis  :    enoxaparin (LOVENOX) injection 30 mg Start: 03/06/21 1200 SCD's Start: 03/05/21 2225 Lovenox    Lab Results  Component Value Date   PLT 435 (H) 03/10/2021    Diet :  Diet Order             Diet Heart Room service appropriate? Yes; Fluid consistency: Thin; Fluid restriction: 1200 mL Fluid  Diet effective now                    Inpatient Medications  Scheduled Meds:  aspirin  81 mg Oral Daily   Or   aspirin  300 mg Rectal Daily   Chlorhexidine Gluconate Cloth  6 each Topical Daily   enoxaparin (LOVENOX) injection  30 mg Subcutaneous Q24H   feeding supplement  1 Container Oral Daily   haloperidol lactate  0.5 mg Intravenous Once   multivitamin with minerals  1 tablet Oral Q breakfast   rosuvastatin  20 mg Oral Daily   tamsulosin  0.4 mg Oral Daily   Continuous Infusions:   PRN Meds:.acetaminophen **OR** acetaminophen (TYLENOL) oral liquid 160 mg/5 mL **OR** acetaminophen, hydrALAZINE, senna-docusate  Antibiotics  :    Anti-infectives (From admission, onward)    None        Time Spent in minutes  30   Lala Lund M.D on 03/11/2021 at 8:56  AM  To page go to [email or web address]   [facility] -  Office  [phone]  See all Orders from today for further details    Objective:   Vitals:   03/10/21 2010 03/11/21 0021  03/11/21 0433 03/11/21 0751  BP: (!) 118/41 (!) 144/60 (!) 161/67 (!) 154/62  Pulse: (!) 105 (!) 102 97 93  Resp: 18 18 18 17   Temp: 98.5 F (36.9 C) 98.3 F (36.8 C) 98.4 F (36.9 C) 98 F (36.7 C)  TempSrc: Oral Oral Oral Oral  SpO2: 96% 94% 96% 94%    Wt Readings from Last 3 Encounters:  03/05/21 68.8 kg  03/01/21 68.8 kg  02/24/21 63.5 kg     Intake/Output Summary (Last 24 hours) at 03/11/2021 0856 Last data filed at 03/10/2021 1300 Gross per 24 hour  Intake 200 ml  Output --  Net 200 ml     Physical Exam  Awake , alert x 1,  ++ R. Arm >> leg weakness, Foley in place [place].AT,PERRAL Supple Neck, No JVD,   Symmetrical Chest wall movement, Good air movement bilaterally, CTAB RRR,No Gallops, Rubs or new Murmurs,  +ve B.Sounds, Abd Soft, No tenderness,   No Cyanosis, Clubbing or edema    Data Review:    CBC Recent Labs  Lab 03/05/21 1025 03/05/21 1039 03/07/21 0329 03/08/21 0420 03/09/21 0239 03/10/21 0330  WBC 9.5  --  9.9 10.1 10.5 14.9*  HGB 11.4* 10.9* 10.6* 11.3* 10.8* 11.5*  HCT 33.1* 32.0* 31.0* 33.3* 32.4* 34.4*  PLT 369  --  376 389 403* 435*  MCV 81.7  --  81.8 82.2 83.3 83.3  MCH 28.1  --  28.0 27.9 27.8 27.8  MCHC 34.4  --  34.2 33.9 33.3 33.4  RDW 14.1  --  14.0 14.1 14.2 14.3  LYMPHSABS 1.3  --  1.9 1.7 2.1 1.6  MONOABS 1.4*  --  1.2* 1.2* 1.3* 1.6*  EOSABS 0.1  --  0.3 0.3 0.3 0.1  BASOSABS 0.1  --  0.1 0.1 0.1 0.1    Electrolytes Recent Labs  Lab 03/05/21 1025 03/05/21 1039 03/05/21 1853 03/06/21 0258 03/07/21 0329 03/08/21 0420 03/09/21 0239 03/09/21 1643 03/10/21 0330  NA 126*   < >  --  126* 125* 127* 127* 131* 130*  K 3.5   < >  --  3.6 3.6 3.7 4.0  --  4.3  CL 95*   < >  --  95* 96* 96* 96*  --  98  CO2 23  --   --  23 23 24 24   --  25  GLUCOSE 119*   < >  --  102* 101* 103* 104*  --  124*  BUN 8   < >  --  6* 8 7* 10  --  12  CREATININE 0.57   < >  --  0.54 0.54 0.52 0.57  --  0.58  CALCIUM 8.2*  --   --  8.0* 8.0*  7.9* 8.2*  --  8.4*  AST 24  --   --   --  17 19 17   --  21  ALT 31  --   --   --  23 21 18   --  22  ALKPHOS 47  --   --   --  45 52 49  --  52  BILITOT 1.1  --   --   --  0.9 1.0 0.6  --  0.8  ALBUMIN 2.6*  --   --   --  2.4* 2.4* 2.5*  --  2.4*  MG  --   --  1.8  --  1.6* 2.1 2.0  --  2.0  INR 1.0  --   --   --   --   --   --   --   --   TSH  --   --  2.867  --   --   --   --   --   --   HGBA1C  --   --   --  5.6  --   --   --   --   --   BNP  --   --   --   --  277.1* 294.4* 192.9*  --  201.0*   < > = values in this interval not displayed.    ------------------------------------------------------------------------------------------------------------------ No results for input(s): CHOL, HDL, LDLCALC, TRIG, CHOLHDL, LDLDIRECT in the last 72 hours.   Lab Results  Component Value Date   HGBA1C 5.6 03/06/2021    No results for input(s): TSH, T4TOTAL, T3FREE, THYROIDAB in the last 72 hours.  Invalid input(s): [place]    Radiology Reports CT HEAD WO CONTRAST (5MM)  Result Date: 03/09/2021 CLINICAL DATA:  Cerebral hemorrhage suspected Stroke-like symptoms.  Recent infarct on MRI. EXAM: CT HEAD WITHOUT CONTRAST TECHNIQUE: Contiguous axial images were obtained from the base of the skull through the vertex without intravenous contrast. COMPARISON:  Head CT 03/07/2021, brain MRI 03/05/2021. FINDINGS: Brain: No acute intracranial hemorrhage. Expected evolution of recent bilateral infarcts with developing encephalomalacia. No hemorrhagic transformation. No evidence of new ischemia. No subdural or extra-axial collection. Generalized atrophy and periventricular chronic small vessel ischemia is again seen. No hydrocephalus, midline shift, or mass effect. Vascular: Skull base atherosclerosis without hyperdense vessel. No suspicious calcification. Skull: No fracture or focal lesion. Sinuses/Orbits: Paranasal sinuses and mastoid air cells are clear. The visualized orbits are unremarkable. Bilateral  cataract resection Other: None. IMPRESSION: 1. Expected evolution of recent bilateral infarcts with developing encephalomalacia. No hemorrhagic transformation or evidence of new ischemia. 2. Stable atrophy and chronic small vessel ischemia. Electronically Signed   By: Keith Rake M.D.   On: 03/09/2021 20:13   CT HEAD WO CONTRAST (5MM)  Result Date: 03/07/2021 CLINICAL DATA:  Stroke, follow up EXAM: CT HEAD WITHOUT CONTRAST TECHNIQUE: Contiguous axial images were obtained from the base of the skull through the vertex without intravenous contrast. COMPARISON:  MRI 03/05/2021, head CT 03/05/2021 FINDINGS: Brain: There is mild loss of gray-white matter differentiation in the left MCA territory in the posterior left frontal and the parietal lobe consistent with infarct, as seen on MRI. Hypoattenuation in the right frontal corona radiata extending towards the insula also consistent with infarct. These findings are increased in conspicuity in comparison to prior CT.No concerning mass effect. No acute intracranial hemorrhage. The basal cisterns are patent.The ventricles are unchanged in size.Scattered subcortical and periventricular white matter hypodensities, nonspecific but likely sequela of chronic small vessel ischemic disease.Mild cerebral atrophy Vascular: Vertebral artery calcifications. Skull: Normal. Negative for fracture or focal lesion. Sinuses/Orbits: No acute finding. Other: None. IMPRESSION: Evolving left MCA territory infarct involving the posterior left frontoparietal region and additional infarct in the right frontal corona radiata. No concerning mass effect. No acute intracranial hemorrhage. Underlying cerebral atrophy with unchanged mild sequela of chronic small vessel ischemic disease. Electronically Signed   By: Maurine Simmering M.D.   On: 03/07/2021 09:18

## 2021-03-11 NOTE — Progress Notes (Signed)
Niece and nephew reported to this RN concerns of a neuro change. Upon assessment vitals were stable and CBG was 141. NIH grew to a 13. Patient was asphasic and slurring words. Neuro assessed and ordered CXR, HCT, urinalysis, labs and cardiac monitoring. All of those have been implemented and currently awaiting results.

## 2021-03-11 NOTE — Plan of Care (Signed)
  Problem: Ischemic Stroke/TIA Tissue Perfusion: Goal: Complications of ischemic stroke/TIA will be minimized Outcome: Progressing   Problem: Education: Goal: Knowledge of General Education information will improve Description: Including pain rating scale, medication(s)/side effects and non-pharmacologic comfort measures Outcome: Progressing   Problem: Health Behavior/Discharge Planning: Goal: Ability to manage health-related needs will improve Outcome: Progressing   Problem: Clinical Measurements: Goal: Ability to maintain clinical measurements within normal limits will improve Outcome: Progressing Goal: Will remain free from infection Outcome: Progressing Goal: Diagnostic test results will improve Outcome: Progressing Goal: Respiratory complications will improve Outcome: Progressing Goal: Cardiovascular complication will be avoided Outcome: Progressing   Problem: Activity: Goal: Risk for activity intolerance will decrease Outcome: Progressing   Problem: Nutrition: Goal: Adequate nutrition will be maintained Outcome: Progressing   Problem: Coping: Goal: Level of anxiety will decrease Outcome: Progressing   Problem: Elimination: Goal: Will not experience complications related to bowel motility Outcome: Progressing Goal: Will not experience complications related to urinary retention Outcome: Progressing   Problem: Pain Managment: Goal: General experience of comfort will improve Outcome: Progressing   Problem: Safety: Goal: Ability to remain free from injury will improve Outcome: Progressing   Problem: Skin Integrity: Goal: Risk for impaired skin integrity will decrease Outcome: Progressing   Problem: Education: Goal: Knowledge of disease or condition will improve Outcome: Progressing Goal: Knowledge of secondary prevention will improve (SELECT ALL) Outcome: Progressing Goal: Knowledge of patient specific risk factors will improve (INDIVIDUALIZE FOR  PATIENT) Outcome: Progressing Goal: Individualized Educational Video(s) Outcome: Progressing   Problem: Coping: Goal: Will verbalize positive feelings about self Outcome: Progressing Goal: Will identify appropriate support needs Outcome: Progressing   Problem: Health Behavior/Discharge Planning: Goal: Ability to manage health-related needs will improve Outcome: Progressing   Problem: Self-Care: Goal: Ability to participate in self-care as condition permits will improve Outcome: Progressing Goal: Verbalization of feelings and concerns over difficulty with self-care will improve Outcome: Progressing Goal: Ability to communicate needs accurately will improve Outcome: Progressing   Problem: Nutrition: Goal: Risk of aspiration will decrease Outcome: Progressing Goal: Dietary intake will improve Outcome: Progressing   Problem: Ischemic Stroke/TIA Tissue Perfusion: Goal: Complications of ischemic stroke/TIA will be minimized Outcome: Progressing

## 2021-03-11 NOTE — Progress Notes (Addendum)
STROKE TEAM PROGRESS NOTE   INTERVAL HISTORY Son and daughter and RN are at the bedside. Family stated that last night, pt was lucid, speaking well, discussed with family about living wills, asking questions without speech difficulty. However, this lunch time, family member feeding her for lunch found her slurry on her words and not talking much. Now son and daughter found her speech much worse, hypophonia and hardly to get words out. Still has right hemiplegia which has been going on for several days per family.   Vitals:   03/11/21 0433 03/11/21 0751 03/11/21 1220 03/11/21 1424  BP: (!) 161/67 (!) 154/62 (!) 110/43 114/60  Pulse: 97 93 (!) 108 (!) 107  Resp: 18 17    Temp: 98.4 F (36.9 C) 98 F (36.7 C) 98.6 F (37 C) 98.1 F (36.7 C)  TempSrc: Oral Oral Oral Oral  SpO2: 96% 94% 95% 99%   CBC:  Recent Labs  Lab 03/09/21 0239 03/10/21 0330 03/11/21 0842  WBC 10.5 14.9* 11.1*  NEUTROABS 6.8 11.5*  --   HGB 10.8* 11.5* 11.0*  HCT 32.4* 34.4* 32.7*  MCV 83.3 83.3 83.6  PLT 403* 435* 144*   Basic Metabolic Panel:  Recent Labs  Lab 03/10/21 0330 03/11/21 0842 03/11/21 0901  NA 130* 132*  --   K 4.3 4.1  --   CL 98 100  --   CO2 25 25  --   GLUCOSE 124* 120*  --   BUN 12 10  --   CREATININE 0.58 0.56  --   CALCIUM 8.4* 8.5*  --   MG 2.0  --  2.0    Lipid Panel:  Recent Labs  Lab 03/06/21 0258  CHOL 199  TRIG 62  HDL 46  CHOLHDL 4.3  VLDL 12  LDLCALC 141*    HgbA1c:  Recent Labs  Lab 03/06/21 0258  HGBA1C 5.6   Urine Drug Screen:  Recent Labs  Lab 03/05/21 1900  LABOPIA NONE DETECTED  COCAINSCRNUR NONE DETECTED  LABBENZ NONE DETECTED  AMPHETMU NONE DETECTED  THCU NONE DETECTED  LABBARB NONE DETECTED    Alcohol Level  Recent Labs  Lab 03/06/21 0258  ETH <10    IMAGING past 24 hours DG Elbow Complete Left  Result Date: 02/24/2021 CLINICAL DATA:  Status post fall, elbow pain EXAM: LEFT ELBOW - COMPLETE 3+ VIEW COMPARISON:  None. FINDINGS:  Subtle lucency involving the superior corner of the olecranon most concerning for a nondisplaced fracture. No other fracture or dislocation. No aggressive osseous lesion. Normal alignment. Soft tissue are unremarkable. No radiopaque foreign body or soft tissue emphysema. IMPRESSION: 1. Subtle lucency involving the superior corner of the olecranon most concerning for a nondisplaced fracture. Electronically Signed   By: Kathreen Devoid M.D.   On: 02/24/2021 12:44   CT HEAD WO CONTRAST (5MM)  Result Date: 03/09/2021 CLINICAL DATA:  Cerebral hemorrhage suspected Stroke-like symptoms.  Recent infarct on MRI. EXAM: CT HEAD WITHOUT CONTRAST TECHNIQUE: Contiguous axial images were obtained from the base of the skull through the vertex without intravenous contrast. COMPARISON:  Head CT 03/07/2021, brain MRI 03/05/2021. FINDINGS: Brain: No acute intracranial hemorrhage. Expected evolution of recent bilateral infarcts with developing encephalomalacia. No hemorrhagic transformation. No evidence of new ischemia. No subdural or extra-axial collection. Generalized atrophy and periventricular chronic small vessel ischemia is again seen. No hydrocephalus, midline shift, or mass effect. Vascular: Skull base atherosclerosis without hyperdense vessel. No suspicious calcification. Skull: No fracture or focal lesion. Sinuses/Orbits: Paranasal sinuses and mastoid  air cells are clear. The visualized orbits are unremarkable. Bilateral cataract resection Other: None. IMPRESSION: 1. Expected evolution of recent bilateral infarcts with developing encephalomalacia. No hemorrhagic transformation or evidence of new ischemia. 2. Stable atrophy and chronic small vessel ischemia. Electronically Signed   By: Keith Rake M.D.   On: 03/09/2021 20:13   CT HEAD WO CONTRAST (5MM)  Result Date: 03/07/2021 CLINICAL DATA:  Stroke, follow up EXAM: CT HEAD WITHOUT CONTRAST TECHNIQUE: Contiguous axial images were obtained from the base of the skull  through the vertex without intravenous contrast. COMPARISON:  MRI 03/05/2021, head CT 03/05/2021 FINDINGS: Brain: There is mild loss of gray-white matter differentiation in the left MCA territory in the posterior left frontal and the parietal lobe consistent with infarct, as seen on MRI. Hypoattenuation in the right frontal corona radiata extending towards the insula also consistent with infarct. These findings are increased in conspicuity in comparison to prior CT.No concerning mass effect. No acute intracranial hemorrhage. The basal cisterns are patent.The ventricles are unchanged in size.Scattered subcortical and periventricular white matter hypodensities, nonspecific but likely sequela of chronic small vessel ischemic disease.Mild cerebral atrophy Vascular: Vertebral artery calcifications. Skull: Normal. Negative for fracture or focal lesion. Sinuses/Orbits: No acute finding. Other: None. IMPRESSION: Evolving left MCA territory infarct involving the posterior left frontoparietal region and additional infarct in the right frontal corona radiata. No concerning mass effect. No acute intracranial hemorrhage. Underlying cerebral atrophy with unchanged mild sequela of chronic small vessel ischemic disease. Electronically Signed   By: Maurine Simmering M.D.   On: 03/07/2021 09:18   CT HEAD WO CONTRAST  Result Date: 03/05/2021 CLINICAL DATA:  Slurred speech.  Right arm weakness. EXAM: CT HEAD WITHOUT CONTRAST TECHNIQUE: Contiguous axial images were obtained from the base of the skull through the vertex without intravenous contrast. COMPARISON:  02/24/2021 FINDINGS: Brain: Moderate low density in the periventricular white matter likely related to small vessel disease. Expected cerebral and cerebellar atrophy for age. Similar subtle hypoattenuation in the right insula likely due to remote infarct. No mass lesion, hemorrhage, hydrocephalus, acute infarct, intra-axial, or extra-axial fluid collection. Vascular: Intracranial  atherosclerosis. No hyperdense vessel or unexpected calcification. Skull: Normal Sinuses/Orbits: Normal imaged portions of the orbits and globes. Clear paranasal sinuses and mastoid air cells. Other: None. IMPRESSION: No acute intracranial abnormality. Cerebral atrophy and small vessel ischemic change. Electronically Signed   By: Abigail Miyamoto M.D.   On: 03/05/2021 11:30   CT Head Wo Contrast  Result Date: 02/24/2021 CLINICAL DATA:  87 year old female with acute fall and head injury. Initial encounter. EXAM: CT HEAD WITHOUT CONTRAST TECHNIQUE: Contiguous axial images were obtained from the base of the skull through the vertex without intravenous contrast. COMPARISON:  None. FINDINGS: Brain: Obscuration of a portion of the RIGHT insular cortex is noted (series 3: Image 15-16) compatible with an infarct of uncertain chronicity, but appears more remote. No evidence of hemorrhage, hydrocephalus, extra-axial collection or mass lesion/mass effect. Atrophy identified. Mild periventricular white matter hypodensities likely represent chronic small vessel white matter ischemic changes. Vascular: Carotid and vertebral atherosclerotic calcifications are noted. Skull: No acute abnormality. Sinuses/Orbits: No acute abnormality Other: None IMPRESSION: 1. 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 Correlate clinically. No evidence of hemorrhage. 2. Atrophy and probable chronic small vessel white matter ischemic changes. Electronically Signed   By: Margarette Canada M.D.   On: 02/24/2021 12:01   MR ANGIO HEAD WO CONTRAST  Result Date: 03/05/2021 CLINICAL DATA:  Initial evaluation for  slurred speech, right arm weakness, stroke. EXAM: MRI HEAD WITHOUT CONTRAST MRA HEAD WITHOUT CONTRAST TECHNIQUE: Multiplanar, multi-echo pulse sequences of the brain and surrounding structures were acquired without intravenous contrast. Angiographic images of the Circle of Willis were acquired using MRA technique  without intravenous contrast. COMPARISON:  Prior head CT from earlier the same day. FINDINGS: MRI HEAD FINDINGS Brain: Mild diffuse prominence of the CSF containing spaces compatible generalized age-related cerebral atrophy. Patchy and confluent T2/FLAIR hyperintensity involving the periventricular deep white matter both cerebral hemispheres as well as the pons, most consistent with chronic small vessel ischemic disease, mild in nature. Patchy areas of restricted diffusion involving the cortical and subcortical aspect of the posterior left frontal and parietal lobes consistent with acute left MCA territory infarct (series 5, image 83). Mild patchy involvement of the left insula as well as the left pre and postcentral gyri. No associated hemorrhage or mass effect. There is an additional acute to early subacute ischemic infarct involving the right frontal corona radiata with extension towards the right insula (series 5, images 88, 83). Associated minimal petechial hemorrhage without frank hemorrhagic transformation or significant mass effect. Otherwise, gray-white matter differentiation maintained with no other evidence for acute or subacute ischemia. No encephalomalacia to suggest chronic cortical infarction elsewhere within the brain. No other evidence for acute or chronic intracranial hemorrhage. No mass lesion, midline shift or mass effect. No hydrocephalus or extra-axial fluid collection. Pituitary gland suprasellar region within normal limits. Midline structures intact. Vascular: Major intracranial vascular flow voids are maintained. Skull and upper cervical spine: Craniocervical junction within normal limits. Bone marrow signal intensity normal. No scalp soft tissue abnormality. Sinuses/Orbits: Patient status post bilateral ocular lens replacement. Globes and orbital soft tissues demonstrate no acute finding. Paranasal sinuses are largely clear. No mastoid effusion. Inner ear structures grossly normal. Other:  None. MRA HEAD FINDINGS Anterior circulation: Visualized distal cervical segments of both internal carotid arteries patent with antegrade flow. Distal cervical right ICA tortuous. Petrous segments patent bilaterally. Moderate atheromatous irregularity throughout the carotid siphons without high-grade stenosis. Severe stenosis noted at the mid left A1 segment (series 1041, image 5). A1 segments otherwise patent. Normal anterior communicating artery complex. Right ACA irregular but patent to its distal aspect without stenosis. Short-segment severe left A2 stenosis (a series 5, image 143). Left ACA patent distally. Atheromatous irregularity within the M1 segments bilaterally without high-grade stenosis. Normal MCA bifurcations. Probable left M3 branch occlusion noted (series 5, image 164), in keeping with the acute left MCA distribution infarct. Moderate atheromatous change seen elsewhere throughout the distal MCA branches bilaterally. Posterior circulation: Both V4 segments patent to the vertebrobasilar junction without stenosis. Both PICA origins patent and normal. Basilar patent to its distal aspect without high-grade stenosis. Superior cerebellar arteries patent bilaterally. Both PCAs primarily supplied via the basilar. Atheromatous irregularity throughout both PCAs with associated moderate to severe bilateral distal P3/P4 stenoses. Anatomic variants: None significant.  No visible aneurysm. IMPRESSION: MRI HEAD IMPRESSION: 1. Patchy acute ischemic nonhemorrhagic left MCA territory infarct involving the posterior left frontoparietal region as above. No associated mass effect. 2. Additional acute to early subacute ischemic infarct involving the right frontal corona radiata. Associated minimal petechial hemorrhage without frank hemorrhagic transformation or significant mass effect. 3. Underlying age-related cerebral atrophy with mild chronic small vessel ischemic disease. MRA HEAD IMPRESSION: 1. Left M3 branch  occlusion, in keeping with the acute left MCA territory infarct. 2. Moderate intracranial atherosclerotic change elsewhere throughout the intracranial circulation as above. Additional notable findings include  severe left A1 and A2 stenoses, with moderate to severe bilateral distal P3 and P4 stenoses. Electronically Signed   By: Jeannine Boga M.D.   On: 03/05/2021 22:58   MR BRAIN WO CONTRAST  Result Date: 03/05/2021 CLINICAL DATA:  Initial evaluation for slurred speech, right arm weakness, stroke. EXAM: MRI HEAD WITHOUT CONTRAST MRA HEAD WITHOUT CONTRAST TECHNIQUE: Multiplanar, multi-echo pulse sequences of the brain and surrounding structures were acquired without intravenous contrast. Angiographic images of the Circle of Willis were acquired using MRA technique without intravenous contrast. COMPARISON:  Prior head CT from earlier the same day. FINDINGS: MRI HEAD FINDINGS Brain: Mild diffuse prominence of the CSF containing spaces compatible generalized age-related cerebral atrophy. Patchy and confluent T2/FLAIR hyperintensity involving the periventricular deep white matter both cerebral hemispheres as well as the pons, most consistent with chronic small vessel ischemic disease, mild in nature. Patchy areas of restricted diffusion involving the cortical and subcortical aspect of the posterior left frontal and parietal lobes consistent with acute left MCA territory infarct (series 5, image 83). Mild patchy involvement of the left insula as well as the left pre and postcentral gyri. No associated hemorrhage or mass effect. There is an additional acute to early subacute ischemic infarct involving the right frontal corona radiata with extension towards the right insula (series 5, images 88, 83). Associated minimal petechial hemorrhage without frank hemorrhagic transformation or significant mass effect. Otherwise, gray-white matter differentiation maintained with no other evidence for acute or subacute  ischemia. No encephalomalacia to suggest chronic cortical infarction elsewhere within the brain. No other evidence for acute or chronic intracranial hemorrhage. No mass lesion, midline shift or mass effect. No hydrocephalus or extra-axial fluid collection. Pituitary gland suprasellar region within normal limits. Midline structures intact. Vascular: Major intracranial vascular flow voids are maintained. Skull and upper cervical spine: Craniocervical junction within normal limits. Bone marrow signal intensity normal. No scalp soft tissue abnormality. Sinuses/Orbits: Patient status post bilateral ocular lens replacement. Globes and orbital soft tissues demonstrate no acute finding. Paranasal sinuses are largely clear. No mastoid effusion. Inner ear structures grossly normal. Other: None. MRA HEAD FINDINGS Anterior circulation: Visualized distal cervical segments of both internal carotid arteries patent with antegrade flow. Distal cervical right ICA tortuous. Petrous segments patent bilaterally. Moderate atheromatous irregularity throughout the carotid siphons without high-grade stenosis. Severe stenosis noted at the mid left A1 segment (series 1041, image 5). A1 segments otherwise patent. Normal anterior communicating artery complex. Right ACA irregular but patent to its distal aspect without stenosis. Short-segment severe left A2 stenosis (a series 5, image 143). Left ACA patent distally. Atheromatous irregularity within the M1 segments bilaterally without high-grade stenosis. Normal MCA bifurcations. Probable left M3 branch occlusion noted (series 5, image 164), in keeping with the acute left MCA distribution infarct. Moderate atheromatous change seen elsewhere throughout the distal MCA branches bilaterally. Posterior circulation: Both V4 segments patent to the vertebrobasilar junction without stenosis. Both PICA origins patent and normal. Basilar patent to its distal aspect without high-grade stenosis. Superior  cerebellar arteries patent bilaterally. Both PCAs primarily supplied via the basilar. Atheromatous irregularity throughout both PCAs with associated moderate to severe bilateral distal P3/P4 stenoses. Anatomic variants: None significant.  No visible aneurysm. IMPRESSION: MRI HEAD IMPRESSION: 1. Patchy acute ischemic nonhemorrhagic left MCA territory infarct involving the posterior left frontoparietal region as above. No associated mass effect. 2. Additional acute to early subacute ischemic infarct involving the right frontal corona radiata. Associated minimal petechial hemorrhage without frank hemorrhagic transformation or significant mass effect.  3. Underlying age-related cerebral atrophy with mild chronic small vessel ischemic disease. MRA HEAD IMPRESSION: 1. Left M3 branch occlusion, in keeping with the acute left MCA territory infarct. 2. Moderate intracranial atherosclerotic change elsewhere throughout the intracranial circulation as above. Additional notable findings include severe left A1 and A2 stenoses, with moderate to severe bilateral distal P3 and P4 stenoses. Electronically Signed   By: Jeannine Boga M.D.   On: 03/05/2021 22:58   US RENAL  Result Date: 02/27/2021 CLINICAL DATA:  Urinary retention EXAM: RENAL / URINARY TRACT ULTRASOUND COMPLETE COMPARISON:  None. FINDINGS: Right Kidney: Renal measurements: 9.7 x 4.7 x 5.2 cm = volume: 124 mL. Echogenicity within normal limits. No mass or hydronephrosis visualized. Left Kidney: Renal measurements: 10.9 x 4.5 x 4.6 cm = volume: 116 mL. Echogenicity within normal limits. There is a 1.1 x 0.8 x 0.9 cm simple cyst within the left kidney. No solid mass or hydronephrosis visualized. Urinary bladder: Appears normal for degree of bladder distention. Foley catheter noted within the urinary bladder lumen. Other: None. IMPRESSION: Unremarkable renal ultrasound. Electronically Signed   By: Iven Finn M.D.   On: 02/27/2021 15:03   DG Shoulder  Left  Result Date: 02/24/2021 CLINICAL DATA:  Status post fall, shoulder pain EXAM: LEFT SHOULDER - 2+ VIEW COMPARISON:  None. FINDINGS: No acute fracture or dislocation. No aggressive osseous lesion. Normal alignment. Generalized osteopenia. Mild osteoarthritis of the glenohumeral joint and acromioclavicular joint. Soft tissue are unremarkable. No radiopaque foreign body or soft tissue emphysema. IMPRESSION: No acute osseous injury of the left shoulder. Electronically Signed   By: Kathreen Devoid M.D.   On: 02/24/2021 12:45   ECHOCARDIOGRAM COMPLETE  Result Date: 02/25/2021    ECHOCARDIOGRAM REPORT   Patient Name:   Cathy Silva Date of Exam: 02/25/2021 Medical Rec #:  [id number]          Height:       66.0 in Accession #:    [id number]         Weight:       140.0 lb Date of Birth:  1929/12/14          BSA:          1.719 m Patient Age:    51 years           BP:           173/67 mmHg Patient Gender: F                  HR:           80 bpm. Exam Location:  Inpatient Procedure: 2D Echo, Color Doppler and Cardiac Doppler Indications:    R07.9* Chest pain, unspecified  History:        Patient has prior history of Echocardiogram examinations, most                 recent 12/25/2020. Aortic Valve Disease; Risk                 Factors:Hypertension and Dyslipidemia.  Sonographer:    Glo Herring Referring Phys: Rossie  1. Left ventricular ejection fraction, by estimation, is 60 to 65%. The left ventricle has normal function. The left ventricle has no regional wall motion abnormalities. There is severe concentric left ventricular hypertrophy. Indeterminate diastolic filling due to E-A fusion.  2. Right ventricular systolic function is normal. The right ventricular size is normal. Tricuspid regurgitation signal is inadequate for assessing PA pressure.  3. The mitral valve is degenerative. No evidence of mitral valve regurgitation. No evidence of mitral stenosis.  4. The aortic valve is  calcified. There is severe calcifcation of the aortic valve. There is severe thickening of the aortic valve. Aortic valve regurgitation is not visualized. Moderate aortic valve stenosis. Aortic valve mean gradient measures 21.0 mmHg. Aortic valve Vmax measures 3.18 m/s.  5. The inferior vena cava is normal in size with <50% respiratory variability, suggesting right atrial pressure of 8 mmHg. FINDINGS  Left Ventricle: Left ventricular ejection fraction, by estimation, is 60 to 65%. The left ventricle has normal function. The left ventricle has no regional wall motion abnormalities. The left ventricular internal cavity size was normal in size. There is  severe concentric left ventricular hypertrophy. Indeterminate diastolic filling due to E-A fusion. Right Ventricle: The right ventricular size is normal. No increase in right ventricular wall thickness. Right ventricular systolic function is normal. Tricuspid regurgitation signal is inadequate for assessing PA pressure. Left Atrium: Left atrial size was normal in size. Right Atrium: Right atrial size was normal in size. Pericardium: There is no evidence of pericardial effusion. Mitral Valve: The mitral valve is degenerative in appearance. There is mild thickening of the mitral valve leaflet(s). There is moderate calcification of the anterior mitral valve leaflet(s). Mild to moderate mitral annular calcification. No evidence of mitral valve regurgitation. No evidence of mitral valve stenosis. Tricuspid Valve: The tricuspid valve is normal in structure. Tricuspid valve regurgitation is trivial. No evidence of tricuspid stenosis. Aortic Valve: The aortic valve is calcified. There is severe calcifcation of the aortic valve. There is severe thickening of the aortic valve. Aortic valve regurgitation is not visualized. Moderate aortic stenosis is present. Aortic valve mean gradient measures 21.0 mmHg. Aortic valve peak gradient measures 40.4 mmHg. Pulmonic Valve: The pulmonic  valve was normal in structure. Pulmonic valve regurgitation is trivial. No evidence of pulmonic stenosis. Aorta: The aortic root is normal in size and structure. Venous: The inferior vena cava is normal in size with less than 50% respiratory variability, suggesting right atrial pressure of 8 mmHg. IAS/Shunts: No atrial level shunt detected by color flow Doppler.  LEFT VENTRICLE PLAX 2D LVIDd:         2.90 cm LVIDs:         2.20 cm LV PW:         1.50 cm LV IVS:        1.50 cm  IVC IVC diam: 1.30 cm LEFT ATRIUM             Index LA diam:        3.20 cm 1.86 cm/m LA Vol (A2C):   35.3 ml 20.54 ml/m LA Vol (A4C):   40.0 ml 23.28 ml/m LA Biplane Vol: 38.6 ml 22.46 ml/m  AORTIC VALVE AV Vmax:           318.00 cm/s AV Vmean:          218.000 cm/s AV VTI:            0.538 m AV Peak Grad:      40.4 mmHg AV Mean Grad:      21.0 mmHg LVOT Vmax:         80.68 cm/s LVOT Vmean:        56.260 cm/s LVOT VTI:          0.158 m LVOT/AV VTI ratio: 0.29  AORTA Ao Root diam: 3.40 cm Ao Asc diam:  2.70 cm  SHUNTS Systemic  VTI: 0.16 m Fransico Him MD Electronically signed by Fransico Him MD Signature Date/Time: 02/25/2021/1:57:48 PM    Final    DG Hip Unilat With Pelvis 2-3 Views Left  Result Date: 02/24/2021 CLINICAL DATA:  Status post fall, bilateral hip pain EXAM: DG HIP (WITH OR WITHOUT PELVIS) 2-3V RIGHT; LEFT FEMUR 2 VIEWS; RIGHT FEMUR 2 VIEWS; DG HIP (WITH OR WITHOUT PELVIS) 2-3V LEFT COMPARISON:  None. FINDINGS: No acute fracture or dislocation. No aggressive osseous lesion. Normal alignment. Generalized osteopenia. Mild osteoarthritis of the hips bilaterally. Moderate left medial femorotibial compartment osteoarthritis. Mild osteoarthritis of the right medial and lateral femorotibial compartments as well as the left lateral femorotibial compartment. Soft tissue are unremarkable. No radiopaque foreign body or soft tissue emphysema. Peripheral vascular atherosclerotic disease. IMPRESSION: 1.  No acute osseous injury of the  right hip. 2.  No acute osseous injury of the left hip. 3.  No acute osseous injury of the right femur. 4.  No acute osseous injury of the left femur. 5. Given the patient's age and osteopenia, if there is persistent clinical concern for an occult hip fracture, a MRI of the hip is recommended for increased sensitivity. Electronically Signed   By: Kathreen Devoid M.D.   On: 02/24/2021 12:52   DG Hip Unilat With Pelvis 2-3 Views Right  Result Date: 02/24/2021 CLINICAL DATA:  Status post fall, bilateral hip pain EXAM: DG HIP (WITH OR WITHOUT PELVIS) 2-3V RIGHT; LEFT FEMUR 2 VIEWS; RIGHT FEMUR 2 VIEWS; DG HIP (WITH OR WITHOUT PELVIS) 2-3V LEFT COMPARISON:  None. FINDINGS: No acute fracture or dislocation. No aggressive osseous lesion. Normal alignment. Generalized osteopenia. Mild osteoarthritis of the hips bilaterally. Moderate left medial femorotibial compartment osteoarthritis. Mild osteoarthritis of the right medial and lateral femorotibial compartments as well as the left lateral femorotibial compartment. Soft tissue are unremarkable. No radiopaque foreign body or soft tissue emphysema. Peripheral vascular atherosclerotic disease. IMPRESSION: 1.  No acute osseous injury of the right hip. 2.  No acute osseous injury of the left hip. 3.  No acute osseous injury of the right femur. 4.  No acute osseous injury of the left femur. 5. Given the patient's age and osteopenia, if there is persistent clinical concern for an occult hip fracture, a MRI of the hip is recommended for increased sensitivity. Electronically Signed   By: Kathreen Devoid M.D.   On: 02/24/2021 12:52   DG FEMUR MIN 2 VIEWS LEFT  Result Date: 02/24/2021 CLINICAL DATA:  Status post fall, bilateral hip pain EXAM: DG HIP (WITH OR WITHOUT PELVIS) 2-3V RIGHT; LEFT FEMUR 2 VIEWS; RIGHT FEMUR 2 VIEWS; DG HIP (WITH OR WITHOUT PELVIS) 2-3V LEFT COMPARISON:  None. FINDINGS: No acute fracture or dislocation. No aggressive osseous lesion. Normal alignment.  Generalized osteopenia. Mild osteoarthritis of the hips bilaterally. Moderate left medial femorotibial compartment osteoarthritis. Mild osteoarthritis of the right medial and lateral femorotibial compartments as well as the left lateral femorotibial compartment. Soft tissue are unremarkable. No radiopaque foreign body or soft tissue emphysema. Peripheral vascular atherosclerotic disease. IMPRESSION: 1.  No acute osseous injury of the right hip. 2.  No acute osseous injury of the left hip. 3.  No acute osseous injury of the right femur. 4.  No acute osseous injury of the left femur. 5. Given the patient's age and osteopenia, if there is persistent clinical concern for an occult hip fracture, a MRI of the hip is recommended for increased sensitivity. Electronically Signed   By: [place] [employer].D.  On: 02/24/2021 12:52   DG FEMUR, MIN 2 VIEWS RIGHT  Result Date: 02/24/2021 CLINICAL DATA:  Status post fall, bilateral hip pain EXAM: DG HIP (WITH OR WITHOUT PELVIS) 2-3V RIGHT; LEFT FEMUR 2 VIEWS; RIGHT FEMUR 2 VIEWS; DG HIP (WITH OR WITHOUT PELVIS) 2-3V LEFT COMPARISON:  None. FINDINGS: No acute fracture or dislocation. No aggressive osseous lesion. Normal alignment. Generalized osteopenia. Mild osteoarthritis of the hips bilaterally. Moderate left medial femorotibial compartment osteoarthritis. Mild osteoarthritis of the right medial and lateral femorotibial compartments as well as the left lateral femorotibial compartment. Soft tissue are unremarkable. No radiopaque foreign body or soft tissue emphysema. Peripheral vascular atherosclerotic disease. IMPRESSION: 1.  No acute osseous injury of the right hip. 2.  No acute osseous injury of the left hip. 3.  No acute osseous injury of the right femur. 4.  No acute osseous injury of the left femur. 5. Given the patient's age and osteopenia, if there is persistent clinical concern for an occult hip fracture, a MRI of the hip is recommended for increased sensitivity.  Electronically Signed   By: Kathreen Devoid M.D.   On: 02/24/2021 12:52   VAS US CAROTID  Result Date: 03/06/2021 Carotid Arterial Duplex Study Patient Name:  Cathy Silva  Date of Exam:   03/06/2021 Medical Rec #: [id number]           Accession #:    [id number] Date of Birth: Aug 22, 1929           Patient Gender: F Patient Age:   26 years Exam Location:  [facility] Procedure:      VAS US CAROTID Referring Phys: Alferd Patee [facility] --------------------------------------------------------------------------------  Indications:       CVA. Risk Factors:      Hyperlipidemia. Comparison Study:  No prior study Performing Technologist: Maudry Mayhew MHA, RDMS, RVT, RDCS  Examination Guidelines: A complete evaluation includes B-mode imaging, spectral Doppler, color Doppler, and power Doppler as needed of all accessible portions of each vessel. Bilateral testing is considered an integral part of a complete examination. Limited examinations for reoccurring indications may be performed as noted.  Right Carotid Findings: +---------+--------+-------+--------+---------------------------------+--------+          PSV cm/sEDV    StenosisPlaque Description               Comments                  cm/s                                                     +---------+--------+-------+--------+---------------------------------+--------+ CCA Prox 67      10                                                       +---------+--------+-------+--------+---------------------------------+--------+ CCA      76      9              smooth and heterogenous                   Distal                                                                    +---------+--------+-------+--------+---------------------------------+--------+  ICA Prox 183     34             heterogenous, irregular and                                               calcific                                   +---------+--------+-------+--------+---------------------------------+--------+ ICA      148     31                                                       Distal                                                                    +---------+--------+-------+--------+---------------------------------+--------+ ECA      70                     heterogenous and calcific                 +---------+--------+-------+--------+---------------------------------+--------+ +----------+--------+-------+----------------+-------------------+           PSV cm/sEDV cmsDescribe        Arm Pressure (mmHG) +----------+--------+-------+----------------+-------------------+ [id number]             Multiphasic, WNL                    +----------+--------+-------+----------------+-------------------+ +---------+--------+--+--------+--+---------+ VertebralPSV cm/s55EDV cm/s15Antegrade +---------+--------+--+--------+--+---------+  Left Carotid Findings: +----------+--------+-------+--------+--------------------------------+--------+           PSV cm/sEDV    StenosisPlaque Description              Comments                   cm/s                                                    +----------+--------+-------+--------+--------------------------------+--------+ CCA Prox  81      8              smooth and heterogenous                  +----------+--------+-------+--------+--------------------------------+--------+ CCA [id number]     8              smooth and heterogenous                  +----------+--------+-------+--------+--------------------------------+--------+ ICA Prox  88      13             smooth, heterogenous and  calcific                                 +----------+--------+-------+--------+--------------------------------+--------+ ICA Distal86      19                                                       +----------+--------+-------+--------+--------------------------------+--------+ ECA       135                                                             +----------+--------+-------+--------+--------------------------------+--------+ +----------+--------+--------+----------------+-------------------+           PSV cm/sEDV cm/sDescribe        Arm Pressure (mmHG) +----------+--------+--------+----------------+-------------------+ Subclavian180             Multiphasic, WNL                    +----------+--------+--------+----------------+-------------------+ +---------+--------+--+--------+--+---------+ VertebralPSV cm/s43EDV cm/s10Antegrade +---------+--------+--+--------+--+---------+   Summary: Right Carotid: Velocities in the right ICA are consistent with an upper range                1-39% stenosis. Left Carotid: Velocities in the left ICA are consistent with a 1-39% stenosis. Vertebrals:  Bilateral vertebral arteries demonstrate antegrade flow. Subclavians: Normal flow hemodynamics were seen in bilateral subclavian              arteries. *See table(s) above for measurements and observations.  Electronically signed by Antony Contras MD on 03/06/2021 at 5:53:07 PM.    Final      PHYSICAL EXAM  Temp:  [97.8 F (36.6 C)-98.6 F (37 C)] 98.1 F (36.7 C) (11/13 1424) Pulse Rate:  [93-113] 107 (11/13 1424) Resp:  [16-18] 17 (11/13 0751) BP: (110-161)/(41-67) 114/60 (11/13 1424) SpO2:  [94 %-99 %] 99 % (11/13 1424)  General - Well nourished, well developed, in no apparent distress, but lethargic.  Ophthalmologic - fundi not visualized due to noncooperation.  Cardiovascular - Regular rhythm and rate.  Neuro - awake, alert, eyes open but lethargic, able to tell me her name but in soft voice, not answer other orientation questions. Able to repeat simple sentence in very dysarthric voice, not able to name, no other language output, however, able to follow midline commands, and  limited peripheral commands. No gaze palsy, tracking bilaterally, blinking to visual threat bilaterally. Left facial droop more prominent but also has mild right facial droop. Tongue midline. RUE and RLE flaccid. LUE no drift, LLE proximal 3/5 and distal 4/5. Sensation, coordination not cooperative and gait not tested.      ASSESSMENT/PLAN Ms. JALISSA HEINZELMAN is a 87 y.o. female with history of  for hyperlipidemia, HTN, hyponatremia, mitral valve prolapse, who presents with right arm weakness, slurred speech and expressive aphasia.  Encephalopathy  11/13 more lethargic, worsening dysarthria and aphasia. Continued right hemiplegia Will repeat CT head stat to rule out hemorrhage Family requested no heroic measures, will not consider thrombectomy at this time Will check UA, CXR, ammonia level Na 132, Cre 0.56. WBC 14.9->11.1, no fever Poor po intake today, will start gentle hydration  Stroke - Left MCA branch infarct as well as right periventricular WM subcortical infarct etiology likely cardioembolic strong suspicion for underlying paroxysmal A. Fib  Code Stroke  CT head No acute abnormality.       MRI BRAIN: Patchy acute ischemic nonhemorrhagic left MCA territory infarct involving the posterior left frontoparietal region as above. Additional acute to early subacute ischemic infarct involving the right frontal corona radiata.  MRA  HEAD Left M3 branch occlusion, in keeping with the acute left MCA territory infarct. Moderate intracranial atherosclerotic change elsewhere throughout. severe left A1 and A2 stenoses, with moderate to severe bilateral distal P3 and P4 stenoses.  2D Echo EF 60-65%  Carotid ultrasound bilateral 1-39% ICA stenosis LDL 141 HgbA1c 5.6 VTE prophylaxis - lovenox No antithrombotic prior to admission, now on aspirin 81 mg daily per Dr. Leonie Man.    Therapy recommendations:  SNF Disposition:  pending  Hypertension Home meds:  amlodipine 5mg  daily, coreg 6.25 bid.  Stable  on the low end Will put on gentle hydration Long-term BP goal normotensive  Hyperlipidemia Home meds:  none  LDL 141, goal < 70 On crestor 20mg   Continue statin at discharge  Other Stroke Risk Factors  Advanced Age >/= 24   Other Active Problems Hyponatremia, Na 131->130->132 Leukocytosis WBC 14.9->11.1 Urinary retention, on foley DNR - family requested no heroic measures    Hospital day # 6  Patient condition worsened today comparing with yesterday, more slurry speech and aphasia. I had long discussion with pt son and daughter at bedside, updated pt current condition, treatment plan and potential prognosis, and answered all the questions. They expressed understanding and appreciation. I discussed with Dr. Candiss Norse. I spent  35 minutes in total face-to-face time with the patient, more than 50% of which was spent in counseling and coordination of care, reviewing test results, images and medication, and discussing the diagnosis, treatment plan and potential prognosis. This patient's care requiresreview of multiple databases, neurological assessment, discussion with family, other specialists and medical decision making of high complexity.  Rosalin Hawking, MD PhD Stroke Neurology 03/11/2021 3:37 PM      To contact Stroke Continuity provider, please refer to [email or web address] After hours, contact General Neurology

## 2021-03-12 ENCOUNTER — Inpatient Hospital Stay (HOSPITAL_COMMUNITY): Payer: Medicare Other

## 2021-03-12 LAB — COMPREHENSIVE METABOLIC PANEL
ALT: 20 U/L (ref 0–44)
AST: 20 U/L (ref 15–41)
Albumin: 2.2 g/dL — ABNORMAL LOW (ref 3.5–5.0)
Alkaline Phosphatase: 44 U/L (ref 38–126)
Anion gap: 7 (ref 5–15)
BUN: 15 mg/dL (ref 8–23)
CO2: 23 mmol/L (ref 22–32)
Calcium: 8.2 mg/dL — ABNORMAL LOW (ref 8.9–10.3)
Chloride: 103 mmol/L (ref 98–111)
Creatinine, Ser: 0.57 mg/dL (ref 0.44–1.00)
GFR, Estimated: >60 mL/min (ref >60)
Glucose, Bld: 123 mg/dL — ABNORMAL HIGH (ref 70–99)
Potassium: 4.1 mmol/L (ref 3.5–5.1)
Sodium: 133 mmol/L — ABNORMAL LOW (ref 135–145)
Total Bilirubin: 0.6 mg/dL (ref 0.3–1.2)
Total Protein: 5.1 g/dL — ABNORMAL LOW (ref 6.5–8.1)

## 2021-03-12 LAB — CBC WITH DIFFERENTIAL/PLATELET
Abs Immature Granulocytes: 0.04 10*3/uL (ref 0.00–0.07)
Basophils Absolute: 0.1 10*3/uL (ref 0.0–0.1)
Basophils Relative: 1 %
Eosinophils Absolute: 0.2 10*3/uL (ref 0.0–0.5)
Eosinophils Relative: 2 %
HCT: 31.4 % — ABNORMAL LOW (ref 36.0–46.0)
Hemoglobin: 10.2 g/dL — ABNORMAL LOW (ref 12.0–15.0)
Immature Granulocytes: 0 %
Lymphocytes Relative: 14 %
Lymphs Abs: 1.5 10*3/uL (ref 0.7–4.0)
MCH: 27.8 pg (ref 26.0–34.0)
MCHC: 32.5 g/dL (ref 30.0–36.0)
MCV: 85.6 fL (ref 80.0–100.0)
Monocytes Absolute: 1.4 10*3/uL — ABNORMAL HIGH (ref 0.1–1.0)
Monocytes Relative: 13 %
Neutro Abs: 7.4 10*3/uL (ref 1.7–7.7)
Neutrophils Relative %: 70 %
Platelets: 411 10*3/uL — ABNORMAL HIGH (ref 150–400)
RBC: 3.67 MIL/uL — ABNORMAL LOW (ref 3.87–5.11)
RDW: 14.4 % (ref 11.5–15.5)
WBC: 10.7 10*3/uL — ABNORMAL HIGH (ref 4.0–10.5)
nRBC: 0 % (ref 0.0–0.2)

## 2021-03-12 LAB — BRAIN NATRIURETIC PEPTIDE: B Natriuretic Peptide: 128.4 pg/mL — ABNORMAL HIGH (ref 0.0–100.0)

## 2021-03-12 LAB — PROCALCITONIN: Procalcitonin: <0.1 ng/mL

## 2021-03-12 LAB — MAGNESIUM: Magnesium: 2 mg/dL (ref 1.7–2.4)

## 2021-03-12 MED ORDER — SODIUM CHLORIDE 0.9 % IV SOLN: INTRAVENOUS | Status: AC

## 2021-03-12 MED ORDER — LACTATED RINGERS IV BOLUS
250.0000 mL | Freq: Once | INTRAVENOUS | Status: AC
  Administered 2021-03-12: 250 mL via INTRAVENOUS

## 2021-03-12 NOTE — Progress Notes (Addendum)
PT Cancellation Note  Patient Details Name: MAZEY MANTELL MRN: [id number] DOB: 10/30/1929   Cancelled Treatment:    Reason Eval/Treat Not Completed: (P) Other (comment) (Pt getting a bath by nursing staff earlier in day, did not have time to attempt in PM.) Will continue efforts per PT POC as schedule permits.   Kara Pacer Akosua Constantine 03/12/2021, 5:19 PM* delayed entry

## 2021-03-12 NOTE — Progress Notes (Addendum)
Patient in bed appears more confused today, denies any headache or chest pain.                                                                   PROGRESS NOTE                                                                                                                                                                                                             Patient Demographics:    Cathy Silva, is a 87 y.o. female, DOB - Jul 17, 1929, PFX:[id number]  Outpatient Primary MD for the patient is Koirala, Dibas, MD    LOS - 7  Admit date - 03/05/2021    Chief Complaint  Patient presents with   Weakness   Stroke Symptoms       Brief Narrative (HPI from H&P)   Cathy Silva is a 87 y.o. female with medical history significant for HTN, anemia, hyponatremia, recent admission for rhabdomyolysis after fall at home.  She was discharged last week to a skilled rehabilitation facility.  Her niece was visiting her every day on Saturday afternoon she noted that Ms. Kreis was not acting normally and had slurred speech and right facial droop and she was not able to move or use her right arm or hand.     Subjective:   Patient in bed denies any headache or shortness of breath but appears more confused today.   Assessment  & Plan :     Acute metabolic encephalopathy with right-sided weakness and slurred speech due to acute CVA. - she has been seen by neurology team and full stroke work-up per neurology and stroke team.  Currently on aspirin, statin added for better LDL control, A1c stable.  She has significant weakness in her right arm and she is right-handed, will require placement as she lives alone, continue PT OT and supportive care, symptoms suggest some further evolution of her CVA in the last 24 hrs, DW Neuro and family 03/11/21.  Her encephalopathy is worse on 03/12/2021.  If there is any significant decline we will focus on keeping her comfortable.  Continue gentle medical treatment but no  heroics.  2.  Dyslipidemia.  Placed on statin.  3.  Hyponatremia.  SIADH, worse after IV fluids, free water restriction, post  Lasix 1 dose on 03/07/2021, [place] on 03/09/21, sodium better will repeat on 03/10/2021.  4.  Hypertension.  For now permissive hypertension due to stroke, will add meds day 5. IVF on 03/12/21 to keep SBP > 140.  5.  Urinary outflow obstruction present on admission.  Came in with Foley catheter along with Flomax.  This was placed about 2 weeks ago according to family, trial of Foley removal failed on 03/10/2021 and it was replaced on 03/10/2021.         Condition - Extremely Guarded  Family Communication  : Updated patient's niece [name] [phone] on 03/07/2021, 03/09/2021, also family members on 03/09/2021, family 03/11/21  Code Status :  Full  Consults  :  Neuro  PUD Prophylaxis : None   Procedures  :     TTE - 02/25/21 -    1. Left ventricular ejection fraction, by estimation, is 60 to 65%. The left ventricle has normal function. The left ventricle has no regional wall motion abnormalities. There is severe concentric left ventricular hypertrophy. Indeterminate diastolic filling due to E-A fusion.   2. Right ventricular systolic function is normal. The right ventricular size is normal. Tricuspid regurgitation signal is inadequate for assessing PA pressure.   3. The mitral valve is degenerative. No evidence of mitral valve regurgitation. No evidence of mitral stenosis.   4. The aortic valve is calcified. There is severe calcifcation of the aortic valve. There is severe thickening of the aortic valve. Aortic valve regurgitation is not visualized. Moderate aortic valve stenosis. Aortic valve mean gradient measures 21.0 mmHg. Aortic valve Vmax measures 3.18 m/s.   5. The inferior vena cava is normal in size with <50% respiratory variability, suggesting right atrial pressure of 8 mmHg.   Vas. US Neck - Non Acute  MRI/A- IMPRESSION: 1. Patchy acute ischemic  nonhemorrhagic left MCA territory infarct involving the posterior left frontoparietal region as above. No associated mass effect. 2. Additional acute to early subacute ischemic infarct involving the right frontal corona radiata. Associated minimal petechial hemorrhage without frank hemorrhagic transformation or significant mass effect. 3. Underlying age-related cerebral atrophy with mild chronic small vessel ischemic disease. MRA HEAD IMPRESSION: 1. Left M3 branch occlusion, in keeping with the acute left MCA territory infarct. 2. Moderate intracranial atherosclerotic change elsewhere throughout the intracranial circulation as above. Additional notable findings include severe left A1 and A2 stenoses, with moderate to severe bilateral distal P3 and P4 stenoses      Disposition Plan  :    Status is: Inpatient  Remains inpatient appropriate because: CVA- needs SNF  DVT Prophylaxis  :    enoxaparin (LOVENOX) injection 30 mg Start: 03/06/21 1200 SCD's Start: 03/05/21 2225 Lovenox    Lab Results  Component Value Date   PLT 411 (H) 03/12/2021    Diet :  Diet Order             Diet Heart Room service appropriate? Yes; Fluid consistency: Thin; Fluid restriction: 1200 mL Fluid  Diet effective now                    Inpatient Medications  Scheduled Meds:  aspirin  81 mg Oral Daily   Or   aspirin  300 mg Rectal Daily   Chlorhexidine Gluconate Cloth  6 each Topical Daily   enoxaparin (LOVENOX) injection  30 mg Subcutaneous Q24H   feeding supplement  1 Container Oral Daily   haloperidol lactate  0.5 mg Intravenous Once   multivitamin with minerals  1 tablet Oral Q breakfast   rosuvastatin  20 mg Oral Daily   tamsulosin  0.4 mg Oral Daily   Continuous Infusions:  sodium chloride 50 mL/hr at 03/12/21 1149   lactated ringers      PRN Meds:.acetaminophen **OR** acetaminophen (TYLENOL) oral liquid 160 mg/5 mL **OR** acetaminophen, hydrALAZINE, senna-docusate  Antibiotics  :     Anti-infectives (From admission, onward)    None        Time Spent in minutes  30   Lala Lund M.D on 03/12/2021 at 12:03 PM  To page go to [email or web address]   [facility] -  Office  [phone]  See all Orders from today for further details    Objective:   Vitals:   03/12/21 0030 03/12/21 0423 03/12/21 0734 03/12/21 1144  BP: (!) 146/49 139/65 (!) 151/61 (!) 127/57  Pulse: 99 94 93 94  Resp: 18 18 18 20   Temp: 98.7 F (37.1 C) 98.4 F (36.9 C) 98 F (36.7 C) 97.8 F (36.6 C)  TempSrc: Oral Oral Oral Axillary  SpO2: 96% 95% 97% 96%    Wt Readings from Last 3 Encounters:  03/05/21 68.8 kg  03/01/21 68.8 kg  02/24/21 63.5 kg     Intake/Output Summary (Last 24 hours) at 03/12/2021 1203 Last data filed at 03/12/2021 0900 Gross per 24 hour  Intake 272.39 ml  Output 300 ml  Net -27.61 ml     Physical Exam  Awake , alert x 1,  ++ R. Arm >> leg weakness, Foley in place [place].AT,PERRAL Supple Neck, No JVD,   Symmetrical Chest wall movement, Good air movement bilaterally, CTAB RRR,No Gallops, Rubs or new Murmurs,  +ve B.Sounds, Abd Soft, No tenderness,   No Cyanosis, Clubbing or edema     Data Review:    CBC Recent Labs  Lab 03/07/21 0329 03/08/21 0420 03/09/21 0239 03/10/21 0330 03/11/21 0842 03/12/21 0338  WBC 9.9 10.1 10.5 14.9* 11.1* 10.7*  HGB 10.6* 11.3* 10.8* 11.5* 11.0* 10.2*  HCT 31.0* 33.3* 32.4* 34.4* 32.7* 31.4*  PLT 376 389 403* 435* 428* 411*  MCV 81.8 82.2 83.3 83.3 83.6 85.6  MCH 28.0 27.9 27.8 27.8 28.1 27.8  MCHC 34.2 33.9 33.3 33.4 33.6 32.5  RDW 14.0 14.1 14.2 14.3 14.4 14.4  LYMPHSABS 1.9 1.7 2.1 1.6  --  1.5  MONOABS 1.2* 1.2* 1.3* 1.6*  --  1.4*  EOSABS 0.3 0.3 0.3 0.1  --  0.2  BASOSABS 0.1 0.1 0.1 0.1  --  0.1    Electrolytes Recent Labs  Lab 03/05/21 1853 03/06/21 0258 03/06/21 0258 03/07/21 0329 03/08/21 0420 03/09/21 0239 03/09/21 1643 03/10/21 0330 03/11/21 0842 03/11/21 0901 03/11/21 1616  03/12/21 0338  NA  --  126*   < > 125* 127* 127* 131* 130* 132*  --   --  133*  K  --  3.6   < > 3.6 3.7 4.0  --  4.3 4.1  --   --  4.1  CL  --  95*   < > 96* 96* 96*  --  98 100  --   --  103  CO2  --  23   < > 23 24 24   --  25 25  --   --  23  GLUCOSE  --  102*   < > 101* 103* 104*  --  124* 120*  --   --  123*  BUN  --  6*   < > 8 7* 10  --  12 10  --   --  15  CREATININE  --  0.54   < > 0.54 0.52 0.57  --  0.58 0.56  --   --  0.57  CALCIUM  --  8.0*   < > 8.0* 7.9* 8.2*  --  8.4* 8.5*  --   --  8.2*  AST  --   --   --  17 19 17   --  21  --   --   --  20  ALT  --   --   --  23 21 18   --  22  --   --   --  20  ALKPHOS  --   --   --  45 52 49  --  52  --   --   --  44  BILITOT  --   --   --  0.9 1.0 0.6  --  0.8  --   --   --  0.6  ALBUMIN  --   --   --  2.4* 2.4* 2.5*  --  2.4*  --   --   --  2.2*  MG 1.8  --   --  1.6* 2.1 2.0  --  2.0  --  2.0  --  2.0  PROCALCITON  --   --   --   --   --   --   --   --   --   --   --  <0.10  TSH 2.867  --   --   --   --   --   --   --   --   --   --   --   HGBA1C  --  5.6  --   --   --   --   --   --   --   --   --   --   AMMONIA  --   --   --   --   --   --   --   --   --   --  <10  --   BNP  --   --   --  277.1* 294.4* 192.9*  --  201.0*  --   --   --  128.4*   < > = values in this interval not displayed.    ------------------------------------------------------------------------------------------------------------------ No results for input(s): CHOL, HDL, LDLCALC, TRIG, CHOLHDL, LDLDIRECT in the last 72 hours.   Lab Results  Component Value Date   HGBA1C 5.6 03/06/2021    No results for input(s): TSH, T4TOTAL, T3FREE, THYROIDAB in the last 72 hours.  Invalid input(s): [place]    Radiology Reports  CT HEAD WO CONTRAST (5MM)  Result Date: 03/11/2021 CLINICAL DATA:  Stroke, follow-up. EXAM: CT HEAD WITHOUT CONTRAST TECHNIQUE: Contiguous axial images were obtained from the base of the skull through the vertex without intravenous  contrast. COMPARISON:  Prior head CT examinations 03/09/2021 and earlier. Brain MRI 03/05/2021. FINDINGS: Brain: Motion degraded exam. Mild generalized cerebral atrophy. Evolving acute/early subacute infarcts within the cortical/subcortical left frontoparietal lobes and right corona radiata, not significantly changed in extent as compared to the prior head CT of 03/09/2021. No significant mass effect. No evidence of hemorrhagic conversion. Stable background mild chronic small vessel ischemic changes. No new demarcated cortical infarction is identified. No extra-axial fluid collection. No evidence of an intracranial mass. No midline shift. Vascular: No hyperdense vessel.  Atherosclerotic calcifications. Skull: Normal. Negative for fracture  or focal lesion. Sinuses/Orbits: Visualized orbits show no acute finding. No significant paranasal sinus disease at the imaged levels. IMPRESSION: Evolving acute/early subacute bilateral MCA territory infarcts within the cortical/subcortical left frontoparietal lobes and right corona radiata, not significantly changed in extent as compared to the head CT of 03/09/2021. No significant mass effect. No evidence of hemorrhagic conversion. Stable background mild chronic small vessel ischemic changes within the cerebral white matter. Mild generalized cerebral atrophy. Electronically Signed   By: Kellie Simmering D.O.   On: 03/11/2021 16:12   CT HEAD WO CONTRAST (5MM)  Result Date: 03/09/2021 CLINICAL DATA:  Cerebral hemorrhage suspected Stroke-like symptoms.  Recent infarct on MRI. EXAM: CT HEAD WITHOUT CONTRAST TECHNIQUE: Contiguous axial images were obtained from the base of the skull through the vertex without intravenous contrast. COMPARISON:  Head CT 03/07/2021, brain MRI 03/05/2021. FINDINGS: Brain: No acute intracranial hemorrhage. Expected evolution of recent bilateral infarcts with developing encephalomalacia. No hemorrhagic transformation. No evidence of new ischemia. No  subdural or extra-axial collection. Generalized atrophy and periventricular chronic small vessel ischemia is again seen. No hydrocephalus, midline shift, or mass effect. Vascular: Skull base atherosclerosis without hyperdense vessel. No suspicious calcification. Skull: No fracture or focal lesion. Sinuses/Orbits: Paranasal sinuses and mastoid air cells are clear. The visualized orbits are unremarkable. Bilateral cataract resection Other: None. IMPRESSION: 1. Expected evolution of recent bilateral infarcts with developing encephalomalacia. No hemorrhagic transformation or evidence of new ischemia. 2. Stable atrophy and chronic small vessel ischemia. Electronically Signed   By: Keith Rake M.D.   On: 03/09/2021 20:13   DG Chest Port 1 View  Result Date: 03/12/2021 CLINICAL DATA:  Shortness of breath EXAM: PORTABLE CHEST 1 VIEW COMPARISON:  Chest x-ray dated March 11, 2021 FINDINGS: Cardiac and mediastinal contours unchanged. Mild left basilar atelectasis. No large pleural effusion or evidence of pneumothorax. IMPRESSION: Mild left basilar atelectasis, lungs otherwise clear. Electronically Signed   By: Yetta Glassman M.D.   On: 03/12/2021 08:05   DG CHEST PORT 1 VIEW  Result Date: 03/11/2021 CLINICAL DATA:  Weakness; stroke symptoms.  Leukocytosis. EXAM: PORTABLE CHEST 1 VIEW COMPARISON:  Prior chest radiographs 05/07/2015. FINDINGS: Heart size at the upper limits of normal. Aortic atherosclerosis. Suspected small left pleural effusion with associated left basilar atelectasis and/or consolidation. No appreciable airspace consolidation within the right lung. No evidence of pneumothorax. No acute bony abnormality identified. Thoracolumbar dextrocurvature. IMPRESSION: Suspected small left pleural effusion with left basilar atelectasis and/or consolidation. Aortic Atherosclerosis (ICD10-I70.0). Electronically Signed   By: Kellie Simmering D.O.   On: 03/11/2021 16:14

## 2021-03-12 NOTE — Progress Notes (Addendum)
STROKE TEAM PROGRESS NOTE   INTERVAL HISTORY No family at bedside. Patient is lying in bed resting with eyes closed in NAD. She awakens easily to voice. She is able to state her name only. Can repeat simple words, unable to name objects, speech is slurred and garbled. She is able to follow simple commands.  Vitals:   03/12/21 0030 03/12/21 0423 03/12/21 0734 03/12/21 1144  BP: (!) 146/49 139/65 (!) 151/61 (!) 127/57  Pulse: 99 94 93 94  Resp: 18 18 18 20   Temp: 98.7 F (37.1 C) 98.4 F (36.9 C) 98 F (36.7 C) 97.8 F (36.6 C)  TempSrc: Oral Oral Oral Axillary  SpO2: 96% 95% 97% 96%   CBC:  Recent Labs  Lab 03/10/21 0330 03/11/21 0842 03/12/21 0338  WBC 14.9* 11.1* 10.7*  NEUTROABS 11.5*  --  7.4  HGB 11.5* 11.0* 10.2*  HCT 34.4* 32.7* 31.4*  MCV 83.3 83.6 85.6  PLT 435* 428* 411*    Basic Metabolic Panel:  Recent Labs  Lab 03/11/21 0842 03/11/21 0901 03/12/21 0338  NA 132*  --  133*  K 4.1  --  4.1  CL 100  --  103  CO2 25  --  23  GLUCOSE 120*  --  123*  BUN 10  --  15  CREATININE 0.56  --  0.57  CALCIUM 8.5*  --  8.2*  MG  --  2.0 2.0     Lipid Panel:  Recent Labs  Lab 03/06/21 0258  CHOL 199  TRIG 62  HDL 46  CHOLHDL 4.3  VLDL 12  LDLCALC 141*     HgbA1c:  Recent Labs  Lab 03/06/21 0258  HGBA1C 5.6    Urine Drug Screen:  Recent Labs  Lab 03/05/21 1900  LABOPIA NONE DETECTED  COCAINSCRNUR NONE DETECTED  LABBENZ NONE DETECTED  AMPHETMU NONE DETECTED  THCU NONE DETECTED  LABBARB NONE DETECTED     Alcohol Level  Recent Labs  Lab 03/06/21 0258  ETH <10     IMAGING past 24 hours DG Elbow Complete Left  Result Date: 02/24/2021 CLINICAL DATA:  Status post fall, elbow pain EXAM: LEFT ELBOW - COMPLETE 3+ VIEW COMPARISON:  None. FINDINGS: Subtle lucency involving the superior corner of the olecranon most concerning for a nondisplaced fracture. No other fracture or dislocation. No aggressive osseous lesion. Normal alignment. Soft  tissue are unremarkable. No radiopaque foreign body or soft tissue emphysema. IMPRESSION: 1. Subtle lucency involving the superior corner of the olecranon most concerning for a nondisplaced fracture. Electronically Signed   By: Kathreen Devoid M.D.   On: 02/24/2021 12:44   CT HEAD WO CONTRAST (5MM)  Result Date: 03/11/2021 CLINICAL DATA:  Stroke, follow-up. EXAM: CT HEAD WITHOUT CONTRAST TECHNIQUE: Contiguous axial images were obtained from the base of the skull through the vertex without intravenous contrast. COMPARISON:  Prior head CT examinations 03/09/2021 and earlier. Brain MRI 03/05/2021. FINDINGS: Brain: Motion degraded exam. Mild generalized cerebral atrophy. Evolving acute/early subacute infarcts within the cortical/subcortical left frontoparietal lobes and right corona radiata, not significantly changed in extent as compared to the prior head CT of 03/09/2021. No significant mass effect. No evidence of hemorrhagic conversion. Stable background mild chronic small vessel ischemic changes. No new demarcated cortical infarction is identified. No extra-axial fluid collection. No evidence of an intracranial mass. No midline shift. Vascular: No hyperdense vessel.  Atherosclerotic calcifications. Skull: Normal. Negative for fracture or focal lesion. Sinuses/Orbits: Visualized orbits show no acute finding. No significant paranasal sinus  disease at the imaged levels. IMPRESSION: Evolving acute/early subacute bilateral MCA territory infarcts within the cortical/subcortical left frontoparietal lobes and right corona radiata, not significantly changed in extent as compared to the head CT of 03/09/2021. No significant mass effect. No evidence of hemorrhagic conversion. Stable background mild chronic small vessel ischemic changes within the cerebral white matter. Mild generalized cerebral atrophy. Electronically Signed   By: Kellie Simmering D.O.   On: 03/11/2021 16:12   CT HEAD WO CONTRAST (5MM)  Result Date:  03/09/2021 CLINICAL DATA:  Cerebral hemorrhage suspected Stroke-like symptoms.  Recent infarct on MRI. EXAM: CT HEAD WITHOUT CONTRAST TECHNIQUE: Contiguous axial images were obtained from the base of the skull through the vertex without intravenous contrast. COMPARISON:  Head CT 03/07/2021, brain MRI 03/05/2021. FINDINGS: Brain: No acute intracranial hemorrhage. Expected evolution of recent bilateral infarcts with developing encephalomalacia. No hemorrhagic transformation. No evidence of new ischemia. No subdural or extra-axial collection. Generalized atrophy and periventricular chronic small vessel ischemia is again seen. No hydrocephalus, midline shift, or mass effect. Vascular: Skull base atherosclerosis without hyperdense vessel. No suspicious calcification. Skull: No fracture or focal lesion. Sinuses/Orbits: Paranasal sinuses and mastoid air cells are clear. The visualized orbits are unremarkable. Bilateral cataract resection Other: None. IMPRESSION: 1. Expected evolution of recent bilateral infarcts with developing encephalomalacia. No hemorrhagic transformation or evidence of new ischemia. 2. Stable atrophy and chronic small vessel ischemia. Electronically Signed   By: Keith Rake M.D.   On: 03/09/2021 20:13   CT HEAD WO CONTRAST (5MM)  Result Date: 03/07/2021 CLINICAL DATA:  Stroke, follow up EXAM: CT HEAD WITHOUT CONTRAST TECHNIQUE: Contiguous axial images were obtained from the base of the skull through the vertex without intravenous contrast. COMPARISON:  MRI 03/05/2021, head CT 03/05/2021 FINDINGS: Brain: There is mild loss of gray-white matter differentiation in the left MCA territory in the posterior left frontal and the parietal lobe consistent with infarct, as seen on MRI. Hypoattenuation in the right frontal corona radiata extending towards the insula also consistent with infarct. These findings are increased in conspicuity in comparison to prior CT.No concerning mass effect. No acute  intracranial hemorrhage. The basal cisterns are patent.The ventricles are unchanged in size.Scattered subcortical and periventricular white matter hypodensities, nonspecific but likely sequela of chronic small vessel ischemic disease.Mild cerebral atrophy Vascular: Vertebral artery calcifications. Skull: Normal. Negative for fracture or focal lesion. Sinuses/Orbits: No acute finding. Other: None. IMPRESSION: Evolving left MCA territory infarct involving the posterior left frontoparietal region and additional infarct in the right frontal corona radiata. No concerning mass effect. No acute intracranial hemorrhage. Underlying cerebral atrophy with unchanged mild sequela of chronic small vessel ischemic disease. Electronically Signed   By: Maurine Simmering M.D.   On: 03/07/2021 09:18   CT HEAD WO CONTRAST  Result Date: 03/05/2021 CLINICAL DATA:  Slurred speech.  Right arm weakness. EXAM: CT HEAD WITHOUT CONTRAST TECHNIQUE: Contiguous axial images were obtained from the base of the skull through the vertex without intravenous contrast. COMPARISON:  02/24/2021 FINDINGS: Brain: Moderate low density in the periventricular white matter likely related to small vessel disease. Expected cerebral and cerebellar atrophy for age. Similar subtle hypoattenuation in the right insula likely due to remote infarct. No mass lesion, hemorrhage, hydrocephalus, acute infarct, intra-axial, or extra-axial fluid collection. Vascular: Intracranial atherosclerosis. No hyperdense vessel or unexpected calcification. Skull: Normal Sinuses/Orbits: Normal imaged portions of the orbits and globes. Clear paranasal sinuses and mastoid air cells. Other: None. IMPRESSION: No acute intracranial abnormality. Cerebral atrophy and small vessel ischemic  change. Electronically Signed   By: Abigail Miyamoto M.D.   On: 03/05/2021 11:30   CT Head Wo Contrast  Result Date: 02/24/2021 CLINICAL DATA:  87 year old female with acute fall and head injury. Initial  encounter. EXAM: CT HEAD WITHOUT CONTRAST TECHNIQUE: Contiguous axial images were obtained from the base of the skull through the vertex without intravenous contrast. COMPARISON:  None. FINDINGS: Brain: Obscuration of a portion of the RIGHT insular cortex is noted (series 3: Image 15-16) compatible with an infarct of uncertain chronicity, but appears more remote. No evidence of hemorrhage, hydrocephalus, extra-axial collection or mass lesion/mass effect. Atrophy identified. Mild periventricular white matter hypodensities likely represent chronic small vessel white matter ischemic changes. Vascular: Carotid and vertebral atherosclerotic calcifications are noted. Skull: No acute abnormality. Sinuses/Orbits: No acute abnormality Other: None IMPRESSION: 1. 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 Correlate clinically. No evidence of hemorrhage. 2. Atrophy and probable chronic small vessel white matter ischemic changes. Electronically Signed   By: Margarette Canada M.D.   On: 02/24/2021 12:01   MR ANGIO HEAD WO CONTRAST  Result Date: 03/05/2021 CLINICAL DATA:  Initial evaluation for slurred speech, right arm weakness, stroke. EXAM: MRI HEAD WITHOUT CONTRAST MRA HEAD WITHOUT CONTRAST TECHNIQUE: Multiplanar, multi-echo pulse sequences of the brain and surrounding structures were acquired without intravenous contrast. Angiographic images of the Circle of Willis were acquired using MRA technique without intravenous contrast. COMPARISON:  Prior head CT from earlier the same day. FINDINGS: MRI HEAD FINDINGS Brain: Mild diffuse prominence of the CSF containing spaces compatible generalized age-related cerebral atrophy. Patchy and confluent T2/FLAIR hyperintensity involving the periventricular deep white matter both cerebral hemispheres as well as the pons, most consistent with chronic small vessel ischemic disease, mild in nature. Patchy areas of restricted diffusion involving the cortical and  subcortical aspect of the posterior left frontal and parietal lobes consistent with acute left MCA territory infarct (series 5, image 83). Mild patchy involvement of the left insula as well as the left pre and postcentral gyri. No associated hemorrhage or mass effect. There is an additional acute to early subacute ischemic infarct involving the right frontal corona radiata with extension towards the right insula (series 5, images 88, 83). Associated minimal petechial hemorrhage without frank hemorrhagic transformation or significant mass effect. Otherwise, gray-white matter differentiation maintained with no other evidence for acute or subacute ischemia. No encephalomalacia to suggest chronic cortical infarction elsewhere within the brain. No other evidence for acute or chronic intracranial hemorrhage. No mass lesion, midline shift or mass effect. No hydrocephalus or extra-axial fluid collection. Pituitary gland suprasellar region within normal limits. Midline structures intact. Vascular: Major intracranial vascular flow voids are maintained. Skull and upper cervical spine: Craniocervical junction within normal limits. Bone marrow signal intensity normal. No scalp soft tissue abnormality. Sinuses/Orbits: Patient status post bilateral ocular lens replacement. Globes and orbital soft tissues demonstrate no acute finding. Paranasal sinuses are largely clear. No mastoid effusion. Inner ear structures grossly normal. Other: None. MRA HEAD FINDINGS Anterior circulation: Visualized distal cervical segments of both internal carotid arteries patent with antegrade flow. Distal cervical right ICA tortuous. Petrous segments patent bilaterally. Moderate atheromatous irregularity throughout the carotid siphons without high-grade stenosis. Severe stenosis noted at the mid left A1 segment (series 1041, image 5). A1 segments otherwise patent. Normal anterior communicating artery complex. Right ACA irregular but patent to its distal  aspect without stenosis. Short-segment severe left A2 stenosis (a series 5, image 143). Left ACA patent distally. Atheromatous irregularity  within the M1 segments bilaterally without high-grade stenosis. Normal MCA bifurcations. Probable left M3 branch occlusion noted (series 5, image 164), in keeping with the acute left MCA distribution infarct. Moderate atheromatous change seen elsewhere throughout the distal MCA branches bilaterally. Posterior circulation: Both V4 segments patent to the vertebrobasilar junction without stenosis. Both PICA origins patent and normal. Basilar patent to its distal aspect without high-grade stenosis. Superior cerebellar arteries patent bilaterally. Both PCAs primarily supplied via the basilar. Atheromatous irregularity throughout both PCAs with associated moderate to severe bilateral distal P3/P4 stenoses. Anatomic variants: None significant.  No visible aneurysm. IMPRESSION: MRI HEAD IMPRESSION: 1. Patchy acute ischemic nonhemorrhagic left MCA territory infarct involving the posterior left frontoparietal region as above. No associated mass effect. 2. Additional acute to early subacute ischemic infarct involving the right frontal corona radiata. Associated minimal petechial hemorrhage without frank hemorrhagic transformation or significant mass effect. 3. Underlying age-related cerebral atrophy with mild chronic small vessel ischemic disease. MRA HEAD IMPRESSION: 1. Left M3 branch occlusion, in keeping with the acute left MCA territory infarct. 2. Moderate intracranial atherosclerotic change elsewhere throughout the intracranial circulation as above. Additional notable findings include severe left A1 and A2 stenoses, with moderate to severe bilateral distal P3 and P4 stenoses. Electronically Signed   By: Jeannine Boga M.D.   On: 03/05/2021 22:58   MR BRAIN WO CONTRAST  Result Date: 03/05/2021 CLINICAL DATA:  Initial evaluation for slurred speech, right arm weakness, stroke.  EXAM: MRI HEAD WITHOUT CONTRAST MRA HEAD WITHOUT CONTRAST TECHNIQUE: Multiplanar, multi-echo pulse sequences of the brain and surrounding structures were acquired without intravenous contrast. Angiographic images of the Circle of Willis were acquired using MRA technique without intravenous contrast. COMPARISON:  Prior head CT from earlier the same day. FINDINGS: MRI HEAD FINDINGS Brain: Mild diffuse prominence of the CSF containing spaces compatible generalized age-related cerebral atrophy. Patchy and confluent T2/FLAIR hyperintensity involving the periventricular deep white matter both cerebral hemispheres as well as the pons, most consistent with chronic small vessel ischemic disease, mild in nature. Patchy areas of restricted diffusion involving the cortical and subcortical aspect of the posterior left frontal and parietal lobes consistent with acute left MCA territory infarct (series 5, image 83). Mild patchy involvement of the left insula as well as the left pre and postcentral gyri. No associated hemorrhage or mass effect. There is an additional acute to early subacute ischemic infarct involving the right frontal corona radiata with extension towards the right insula (series 5, images 88, 83). Associated minimal petechial hemorrhage without frank hemorrhagic transformation or significant mass effect. Otherwise, gray-white matter differentiation maintained with no other evidence for acute or subacute ischemia. No encephalomalacia to suggest chronic cortical infarction elsewhere within the brain. No other evidence for acute or chronic intracranial hemorrhage. No mass lesion, midline shift or mass effect. No hydrocephalus or extra-axial fluid collection. Pituitary gland suprasellar region within normal limits. Midline structures intact. Vascular: Major intracranial vascular flow voids are maintained. Skull and upper cervical spine: Craniocervical junction within normal limits. Bone marrow signal intensity normal.  No scalp soft tissue abnormality. Sinuses/Orbits: Patient status post bilateral ocular lens replacement. Globes and orbital soft tissues demonstrate no acute finding. Paranasal sinuses are largely clear. No mastoid effusion. Inner ear structures grossly normal. Other: None. MRA HEAD FINDINGS Anterior circulation: Visualized distal cervical segments of both internal carotid arteries patent with antegrade flow. Distal cervical right ICA tortuous. Petrous segments patent bilaterally. Moderate atheromatous irregularity throughout the carotid siphons without high-grade stenosis. Severe stenosis noted at  the mid left A1 segment (series 1041, image 5). A1 segments otherwise patent. Normal anterior communicating artery complex. Right ACA irregular but patent to its distal aspect without stenosis. Short-segment severe left A2 stenosis (a series 5, image 143). Left ACA patent distally. Atheromatous irregularity within the M1 segments bilaterally without high-grade stenosis. Normal MCA bifurcations. Probable left M3 branch occlusion noted (series 5, image 164), in keeping with the acute left MCA distribution infarct. Moderate atheromatous change seen elsewhere throughout the distal MCA branches bilaterally. Posterior circulation: Both V4 segments patent to the vertebrobasilar junction without stenosis. Both PICA origins patent and normal. Basilar patent to its distal aspect without high-grade stenosis. Superior cerebellar arteries patent bilaterally. Both PCAs primarily supplied via the basilar. Atheromatous irregularity throughout both PCAs with associated moderate to severe bilateral distal P3/P4 stenoses. Anatomic variants: None significant.  No visible aneurysm. IMPRESSION: MRI HEAD IMPRESSION: 1. Patchy acute ischemic nonhemorrhagic left MCA territory infarct involving the posterior left frontoparietal region as above. No associated mass effect. 2. Additional acute to early subacute ischemic infarct involving the right  frontal corona radiata. Associated minimal petechial hemorrhage without frank hemorrhagic transformation or significant mass effect. 3. Underlying age-related cerebral atrophy with mild chronic small vessel ischemic disease. MRA HEAD IMPRESSION: 1. Left M3 branch occlusion, in keeping with the acute left MCA territory infarct. 2. Moderate intracranial atherosclerotic change elsewhere throughout the intracranial circulation as above. Additional notable findings include severe left A1 and A2 stenoses, with moderate to severe bilateral distal P3 and P4 stenoses. Electronically Signed   By: Jeannine Boga M.D.   On: 03/05/2021 22:58   US RENAL  Result Date: 02/27/2021 CLINICAL DATA:  Urinary retention EXAM: RENAL / URINARY TRACT ULTRASOUND COMPLETE COMPARISON:  None. FINDINGS: Right Kidney: Renal measurements: 9.7 x 4.7 x 5.2 cm = volume: 124 mL. Echogenicity within normal limits. No mass or hydronephrosis visualized. Left Kidney: Renal measurements: 10.9 x 4.5 x 4.6 cm = volume: 116 mL. Echogenicity within normal limits. There is a 1.1 x 0.8 x 0.9 cm simple cyst within the left kidney. No solid mass or hydronephrosis visualized. Urinary bladder: Appears normal for degree of bladder distention. Foley catheter noted within the urinary bladder lumen. Other: None. IMPRESSION: Unremarkable renal ultrasound. Electronically Signed   By: Iven Finn M.D.   On: 02/27/2021 15:03   DG Chest Port 1 View  Result Date: 03/12/2021 CLINICAL DATA:  Shortness of breath EXAM: PORTABLE CHEST 1 VIEW COMPARISON:  Chest x-ray dated March 11, 2021 FINDINGS: Cardiac and mediastinal contours unchanged. Mild left basilar atelectasis. No large pleural effusion or evidence of pneumothorax. IMPRESSION: Mild left basilar atelectasis, lungs otherwise clear. Electronically Signed   By: Yetta Glassman M.D.   On: 03/12/2021 08:05   DG CHEST PORT 1 VIEW  Result Date: 03/11/2021 CLINICAL DATA:  Weakness; stroke symptoms.   Leukocytosis. EXAM: PORTABLE CHEST 1 VIEW COMPARISON:  Prior chest radiographs 05/07/2015. FINDINGS: Heart size at the upper limits of normal. Aortic atherosclerosis. Suspected small left pleural effusion with associated left basilar atelectasis and/or consolidation. No appreciable airspace consolidation within the right lung. No evidence of pneumothorax. No acute bony abnormality identified. Thoracolumbar dextrocurvature. IMPRESSION: Suspected small left pleural effusion with left basilar atelectasis and/or consolidation. Aortic Atherosclerosis (ICD10-I70.0). Electronically Signed   By: Kellie Simmering D.O.   On: 03/11/2021 16:14   DG Shoulder Left  Result Date: 02/24/2021 CLINICAL DATA:  Status post fall, shoulder pain EXAM: LEFT SHOULDER - 2+ VIEW COMPARISON:  None. FINDINGS: No acute fracture or  dislocation. No aggressive osseous lesion. Normal alignment. Generalized osteopenia. Mild osteoarthritis of the glenohumeral joint and acromioclavicular joint. Soft tissue are unremarkable. No radiopaque foreign body or soft tissue emphysema. IMPRESSION: No acute osseous injury of the left shoulder. Electronically Signed   By: Kathreen Devoid M.D.   On: 02/24/2021 12:45   ECHOCARDIOGRAM COMPLETE  Result Date: 02/25/2021    ECHOCARDIOGRAM REPORT   Patient Name:   Cathy Silva Date of Exam: 02/25/2021 Medical Rec #:  [id number]          Height:       66.0 in Accession #:    [id number]         Weight:       140.0 lb Date of Birth:  1929/08/24          BSA:          1.719 m Patient Age:    14 years           BP:           173/67 mmHg Patient Gender: F                  HR:           80 bpm. Exam Location:  Inpatient Procedure: 2D Echo, Color Doppler and Cardiac Doppler Indications:    R07.9* Chest pain, unspecified  History:        Patient has prior history of Echocardiogram examinations, most                 recent 12/25/2020. Aortic Valve Disease; Risk                 Factors:Hypertension and Dyslipidemia.   Sonographer:    Glo Herring Referring Phys: [place]  1. Left ventricular ejection fraction, by estimation, is 60 to 65%. The left ventricle has normal function. The left ventricle has no regional wall motion abnormalities. There is severe concentric left ventricular hypertrophy. Indeterminate diastolic filling due to E-A fusion.  2. Right ventricular systolic function is normal. The right ventricular size is normal. Tricuspid regurgitation signal is inadequate for assessing PA pressure.  3. The mitral valve is degenerative. No evidence of mitral valve regurgitation. No evidence of mitral stenosis.  4. The aortic valve is calcified. There is severe calcifcation of the aortic valve. There is severe thickening of the aortic valve. Aortic valve regurgitation is not visualized. Moderate aortic valve stenosis. Aortic valve mean gradient measures 21.0 mmHg. Aortic valve Vmax measures 3.18 m/s.  5. The inferior vena cava is normal in size with <50% respiratory variability, suggesting right atrial pressure of 8 mmHg. FINDINGS  Left Ventricle: Left ventricular ejection fraction, by estimation, is 60 to 65%. The left ventricle has normal function. The left ventricle has no regional wall motion abnormalities. The left ventricular internal cavity size was normal in size. There is  severe concentric left ventricular hypertrophy. Indeterminate diastolic filling due to E-A fusion. Right Ventricle: The right ventricular size is normal. No increase in right ventricular wall thickness. Right ventricular systolic function is normal. Tricuspid regurgitation signal is inadequate for assessing PA pressure. Left Atrium: Left atrial size was normal in size. Right Atrium: Right atrial size was normal in size. Pericardium: There is no evidence of pericardial effusion. Mitral Valve: The mitral valve is degenerative in appearance. There is mild thickening of the mitral valve leaflet(s). There is moderate  calcification of the anterior mitral valve leaflet(s). Mild to moderate mitral annular calcification.  No evidence of mitral valve regurgitation. No evidence of mitral valve stenosis. Tricuspid Valve: The tricuspid valve is normal in structure. Tricuspid valve regurgitation is trivial. No evidence of tricuspid stenosis. Aortic Valve: The aortic valve is calcified. There is severe calcifcation of the aortic valve. There is severe thickening of the aortic valve. Aortic valve regurgitation is not visualized. Moderate aortic stenosis is present. Aortic valve mean gradient measures 21.0 mmHg. Aortic valve peak gradient measures 40.4 mmHg. Pulmonic Valve: The pulmonic valve was normal in structure. Pulmonic valve regurgitation is trivial. No evidence of pulmonic stenosis. Aorta: The aortic root is normal in size and structure. Venous: The inferior vena cava is normal in size with less than 50% respiratory variability, suggesting right atrial pressure of 8 mmHg. IAS/Shunts: No atrial level shunt detected by color flow Doppler.  LEFT VENTRICLE PLAX 2D LVIDd:         2.90 cm LVIDs:         2.20 cm LV PW:         1.50 cm LV IVS:        1.50 cm  IVC IVC diam: 1.30 cm LEFT ATRIUM             Index LA diam:        3.20 cm 1.86 cm/m LA Vol (A2C):   35.3 ml 20.54 ml/m LA Vol (A4C):   40.0 ml 23.28 ml/m LA Biplane Vol: 38.6 ml 22.46 ml/m  AORTIC VALVE AV Vmax:           318.00 cm/s AV Vmean:          218.000 cm/s AV VTI:            0.538 m AV Peak Grad:      40.4 mmHg AV Mean Grad:      21.0 mmHg LVOT Vmax:         80.68 cm/s LVOT Vmean:        56.260 cm/s LVOT VTI:          0.158 m LVOT/AV VTI ratio: 0.29  AORTA Ao Root diam: 3.40 cm Ao Asc diam:  2.70 cm  SHUNTS Systemic VTI: 0.16 m Fransico Him MD Electronically signed by Fransico Him MD Signature Date/Time: 02/25/2021/1:57:48 PM    Final    DG Hip Unilat With Pelvis 2-3 Views Left  Result Date: 02/24/2021 CLINICAL DATA:  Status post fall, bilateral hip pain EXAM: DG HIP  (WITH OR WITHOUT PELVIS) 2-3V RIGHT; LEFT FEMUR 2 VIEWS; RIGHT FEMUR 2 VIEWS; DG HIP (WITH OR WITHOUT PELVIS) 2-3V LEFT COMPARISON:  None. FINDINGS: No acute fracture or dislocation. No aggressive osseous lesion. Normal alignment. Generalized osteopenia. Mild osteoarthritis of the hips bilaterally. Moderate left medial femorotibial compartment osteoarthritis. Mild osteoarthritis of the right medial and lateral femorotibial compartments as well as the left lateral femorotibial compartment. Soft tissue are unremarkable. No radiopaque foreign body or soft tissue emphysema. Peripheral vascular atherosclerotic disease. IMPRESSION: 1.  No acute osseous injury of the right hip. 2.  No acute osseous injury of the left hip. 3.  No acute osseous injury of the right femur. 4.  No acute osseous injury of the left femur. 5. Given the patient's age and osteopenia, if there is persistent clinical concern for an occult hip fracture, a MRI of the hip is recommended for increased sensitivity. Electronically Signed   By: Kathreen Devoid M.D.   On: 02/24/2021 12:52   DG Hip Unilat With Pelvis 2-3 Views Right  Result Date: 02/24/2021  CLINICAL DATA:  Status post fall, bilateral hip pain EXAM: DG HIP (WITH OR WITHOUT PELVIS) 2-3V RIGHT; LEFT FEMUR 2 VIEWS; RIGHT FEMUR 2 VIEWS; DG HIP (WITH OR WITHOUT PELVIS) 2-3V LEFT COMPARISON:  None. FINDINGS: No acute fracture or dislocation. No aggressive osseous lesion. Normal alignment. Generalized osteopenia. Mild osteoarthritis of the hips bilaterally. Moderate left medial femorotibial compartment osteoarthritis. Mild osteoarthritis of the right medial and lateral femorotibial compartments as well as the left lateral femorotibial compartment. Soft tissue are unremarkable. No radiopaque foreign body or soft tissue emphysema. Peripheral vascular atherosclerotic disease. IMPRESSION: 1.  No acute osseous injury of the right hip. 2.  No acute osseous injury of the left hip. 3.  No acute osseous  injury of the right femur. 4.  No acute osseous injury of the left femur. 5. Given the patient's age and osteopenia, if there is persistent clinical concern for an occult hip fracture, a MRI of the hip is recommended for increased sensitivity. Electronically Signed   By: Kathreen Devoid M.D.   On: 02/24/2021 12:52   DG FEMUR MIN 2 VIEWS LEFT  Result Date: 02/24/2021 CLINICAL DATA:  Status post fall, bilateral hip pain EXAM: DG HIP (WITH OR WITHOUT PELVIS) 2-3V RIGHT; LEFT FEMUR 2 VIEWS; RIGHT FEMUR 2 VIEWS; DG HIP (WITH OR WITHOUT PELVIS) 2-3V LEFT COMPARISON:  None. FINDINGS: No acute fracture or dislocation. No aggressive osseous lesion. Normal alignment. Generalized osteopenia. Mild osteoarthritis of the hips bilaterally. Moderate left medial femorotibial compartment osteoarthritis. Mild osteoarthritis of the right medial and lateral femorotibial compartments as well as the left lateral femorotibial compartment. Soft tissue are unremarkable. No radiopaque foreign body or soft tissue emphysema. Peripheral vascular atherosclerotic disease. IMPRESSION: 1.  No acute osseous injury of the right hip. 2.  No acute osseous injury of the left hip. 3.  No acute osseous injury of the right femur. 4.  No acute osseous injury of the left femur. 5. Given the patient's age and osteopenia, if there is persistent clinical concern for an occult hip fracture, a MRI of the hip is recommended for increased sensitivity. Electronically Signed   By: Kathreen Devoid M.D.   On: 02/24/2021 12:52   DG FEMUR, MIN 2 VIEWS RIGHT  Result Date: 02/24/2021 CLINICAL DATA:  Status post fall, bilateral hip pain EXAM: DG HIP (WITH OR WITHOUT PELVIS) 2-3V RIGHT; LEFT FEMUR 2 VIEWS; RIGHT FEMUR 2 VIEWS; DG HIP (WITH OR WITHOUT PELVIS) 2-3V LEFT COMPARISON:  None. FINDINGS: No acute fracture or dislocation. No aggressive osseous lesion. Normal alignment. Generalized osteopenia. Mild osteoarthritis of the hips bilaterally. Moderate left medial  femorotibial compartment osteoarthritis. Mild osteoarthritis of the right medial and lateral femorotibial compartments as well as the left lateral femorotibial compartment. Soft tissue are unremarkable. No radiopaque foreign body or soft tissue emphysema. Peripheral vascular atherosclerotic disease. IMPRESSION: 1.  No acute osseous injury of the right hip. 2.  No acute osseous injury of the left hip. 3.  No acute osseous injury of the right femur. 4.  No acute osseous injury of the left femur. 5. Given the patient's age and osteopenia, if there is persistent clinical concern for an occult hip fracture, a MRI of the hip is recommended for increased sensitivity. Electronically Signed   By: Kathreen Devoid M.D.   On: 02/24/2021 12:52   VAS US CAROTID  Result Date: 03/06/2021 Carotid Arterial Duplex Study Patient Name:  Cathy Silva  Date of Exam:   03/06/2021 Medical Rec #: [id number]  Accession #:    [id number] Date of Birth: 1930/01/25           Patient Gender: F Patient Age:   49 years Exam Location:  [facility] Procedure:      VAS US CAROTID Referring Phys: [facility] --------------------------------------------------------------------------------  Indications:       CVA. Risk Factors:      Hyperlipidemia. Comparison Study:  No prior study Performing Technologist: Maudry Mayhew MHA, RDMS, RVT, RDCS  Examination Guidelines: A complete evaluation includes B-mode imaging, spectral Doppler, color Doppler, and power Doppler as needed of all accessible portions of each vessel. Bilateral testing is considered an integral part of a complete examination. Limited examinations for reoccurring indications may be performed as noted.  Right Carotid Findings: +---------+--------+-------+--------+---------------------------------+--------+          PSV cm/sEDV    StenosisPlaque Description               Comments                  cm/s                                                      +---------+--------+-------+--------+---------------------------------+--------+ CCA Prox 67      10                                                       +---------+--------+-------+--------+---------------------------------+--------+ CCA      76      9              smooth and heterogenous                   Distal                                                                    +---------+--------+-------+--------+---------------------------------+--------+ ICA Prox 183     34             heterogenous, irregular and                                               calcific                                  +---------+--------+-------+--------+---------------------------------+--------+ ICA      148     31                                                       Distal                                                                    +---------+--------+-------+--------+---------------------------------+--------+  ECA      70                     heterogenous and calcific                 +---------+--------+-------+--------+---------------------------------+--------+ +----------+--------+-------+----------------+-------------------+           PSV cm/sEDV cmsDescribe        Arm Pressure (mmHG) +----------+--------+-------+----------------+-------------------+ [id number]             Multiphasic, WNL                    +----------+--------+-------+----------------+-------------------+ +---------+--------+--+--------+--+---------+ VertebralPSV cm/s55EDV cm/s15Antegrade +---------+--------+--+--------+--+---------+  Left Carotid Findings: +----------+--------+-------+--------+--------------------------------+--------+           PSV cm/sEDV    StenosisPlaque Description              Comments                   cm/s                                                     +----------+--------+-------+--------+--------------------------------+--------+ CCA Prox  81      8              smooth and heterogenous                  +----------+--------+-------+--------+--------------------------------+--------+ CCA [id number]     8              smooth and heterogenous                  +----------+--------+-------+--------+--------------------------------+--------+ ICA Prox  88      13             smooth, heterogenous and                                                  calcific                                 +----------+--------+-------+--------+--------------------------------+--------+ ICA Distal86      19                                                      +----------+--------+-------+--------+--------------------------------+--------+ ECA       135                                                             +----------+--------+-------+--------+--------------------------------+--------+ +----------+--------+--------+----------------+-------------------+           PSV cm/sEDV cm/sDescribe        Arm Pressure (mmHG) +----------+--------+--------+----------------+-------------------+ Subclavian180             Multiphasic, WNL                    +----------+--------+--------+----------------+-------------------+ +---------+--------+--+--------+--+---------+  VertebralPSV cm/s43EDV cm/s10Antegrade +---------+--------+--+--------+--+---------+   Summary: Right Carotid: Velocities in the right ICA are consistent with an upper range                1-39% stenosis. Left Carotid: Velocities in the left ICA are consistent with a 1-39% stenosis. Vertebrals:  Bilateral vertebral arteries demonstrate antegrade flow. Subclavians: Normal flow hemodynamics were seen in bilateral subclavian              arteries. *See table(s) above for measurements and observations.  Electronically signed by Antony Contras MD on 03/06/2021 at 5:53:07 PM.    Final       PHYSICAL EXAM  Temp:  [97.8 F (36.6 C)-98.7 F (37.1 C)] 97.8 F (36.6 C) (11/14 1144) Pulse Rate:  [93-99] 94 (11/14 1144) Resp:  [18-20] 20 (11/14 1144) BP: (125-151)/(49-65) 127/57 (11/14 1144) SpO2:  [95 %-98 %] 96 % (11/14 1144)  General - Well nourished, well developed, in no apparent distress, mildly lethargic.  Ophthalmologic - fundi not visualized due to noncooperation.  Cardiovascular - Regular rhythm and rate.  Neuro - resting with eyes closed, awakens easily to voice. Able to state her name but in soft voice, not answer other orientation questions. Able to repeat simple sentence in very dysarthric and garbled at times, not able to name, no other language output, however, able to follow midline commands, and limited peripheral commands. No gaze palsy, tracking bilaterally, blinking to visual threat bilaterally. right facial droop. Tongue midline. RUE flaccid, RLE able to move slightly. LUE no drift, LLE proximal 3/5 and distal 4/5. She grimaces to pain in RUE and RLE, no movement to pain in RUE, RLE flickers to pain. No ataxia noted. Gait not tested  ASSESSMENT/PLAN Cathy Silva is a 87 y.o. female with history of  for hyperlipidemia, HTN, hyponatremia, mitral valve prolapse, who presents with right arm weakness, slurred speech and expressive aphasia.  Encephalopathy  11/13 more lethargic, worsening dysarthria and aphasia. Continued right hemiplegia repeat CT head stat - Evolving acute/early subacute bilateral MCA territory infarcts within the cortical/subcortical left frontoparietal lobes and right corona radiata, not significantly changed. Family requested no heroic measures, will not consider thrombectomy at this time UA negative CXR Mild left basilar atelectasis, lungs otherwise clear ammonia negative Na 133, Cre 0.57. WBC 14.9->11.1->10.7, no fever.  On IVF   Stroke - Left MCA branch infarct as well as right periventricular WM subcortical infarct  etiology likely cardioembolic strong suspicion for underlying paroxysmal A. Fib  Code Stroke  CT head No acute abnormality.       MRI BRAIN: Patchy acute ischemic nonhemorrhagic left MCA territory infarct involving the posterior left frontoparietal region as above. Additional acute to early subacute ischemic infarct involving the right frontal corona radiata.  MRA  HEAD Left M3 branch occlusion, in keeping with the acute left MCA territory infarct. Moderate intracranial atherosclerotic change elsewhere throughout. severe left A1 and A2 stenoses, with moderate to severe bilateral distal P3 and P4 stenoses.  2D Echo EF 60-65%  Carotid ultrasound bilateral 1-39% ICA stenosis LDL 141 HgbA1c 5.6 VTE prophylaxis - lovenox/ SCd's No antithrombotic prior to admission, now on aspirin 81 mg daily per Dr. Leonie Man.    Therapy recommendations:  SNF Disposition:  pending  Hypertension Home meds:  amlodipine 5mg  daily, coreg 6.25 bid.  Stable on the low end Will put on gentle hydration Long-term BP goal normotensive  Hyperlipidemia Home meds:  none  LDL 141, goal < 70 On crestor 20mg   Continue  statin at discharge  Other Stroke Risk Factors  Advanced Age >/= 91   Other Active Problems Hyponatremia, SIADH Na 131->130->132->133 Leukocytosis WBC 14.9->11.1->10.7 Urinary retention, on foley DNR - family requested no heroic measures  Beulah Gandy, NP   ATTENDING NOTE: I reviewed above note and agree with the assessment and plan. Pt was seen and examined.   Niece at bedside.  Patient sitting in bed, much more awake alert, and interactive than yesterday.  Speech much improved than yesterday.  On exam, patient orientated to age, people, time and place.  Still has mild to moderate expressive aphasia, not able to say a whole sentence but able to say first several words.  Follows most simple commands.  Repeat simple sentences but not complicate sentences.  Able to name 3/4.  Visual fields full, no gaze  palsy, mild right facial droop.  Right upper and lower extremity hemiplegia, left upper extremity no drift, left lower extremity 3/5 proximal and 4/5 distal.  Sensation symmetrical.  Finger-to-nose on the left intact.  Pt encephalopathy work-up so far negative.  UA negative, CXR unremarkable.  Leukocytosis improving, CT stable.  No fever, electrolytes and ammonia level unremarkable.  Patient clinical condition much improved and near her new post stroke baseline, yesterday etiology for neuro change not quite clear but could be recommending symptoms versus encephalopathy.  Continue aspirin and statin.  PT/OT recommend SNF.  No further recommendation from neurology.  For detailed assessment and plan, please refer to above as I have made changes wherever appropriate.   Neurology will sign off. Please call with questions. Pt will follow up with stroke clinic NP at [facility] in about 4 weeks. Thanks for the consult.   Rosalin Hawking, MD PhD Stroke Neurology 03/12/2021 6:59 PM    To contact Stroke Continuity provider, please refer to [email or web address] After hours, contact General Neurology

## 2021-03-13 DIAGNOSIS — E441 Mild protein-calorie malnutrition: Secondary | ICD-10-CM | POA: Diagnosis not present

## 2021-03-13 DIAGNOSIS — L89622 Pressure ulcer of left heel, stage 2: Secondary | ICD-10-CM | POA: Diagnosis not present

## 2021-03-13 DIAGNOSIS — Z515 Encounter for palliative care: Secondary | ICD-10-CM | POA: Diagnosis not present

## 2021-03-13 DIAGNOSIS — L0390 Cellulitis, unspecified: Secondary | ICD-10-CM | POA: Diagnosis not present

## 2021-03-13 DIAGNOSIS — L89610 Pressure ulcer of right heel, unstageable: Secondary | ICD-10-CM | POA: Diagnosis not present

## 2021-03-13 DIAGNOSIS — I69318 Other symptoms and signs involving cognitive functions following cerebral infarction: Secondary | ICD-10-CM | POA: Diagnosis not present

## 2021-03-13 DIAGNOSIS — G459 Transient cerebral ischemic attack, unspecified: Secondary | ICD-10-CM | POA: Diagnosis not present

## 2021-03-13 DIAGNOSIS — F339 Major depressive disorder, recurrent, unspecified: Secondary | ICD-10-CM | POA: Diagnosis not present

## 2021-03-13 DIAGNOSIS — H9193 Unspecified hearing loss, bilateral: Secondary | ICD-10-CM | POA: Diagnosis not present

## 2021-03-13 DIAGNOSIS — R338 Other retention of urine: Secondary | ICD-10-CM | POA: Diagnosis not present

## 2021-03-13 DIAGNOSIS — R279 Unspecified lack of coordination: Secondary | ICD-10-CM | POA: Diagnosis not present

## 2021-03-13 DIAGNOSIS — F4323 Adjustment disorder with mixed anxiety and depressed mood: Secondary | ICD-10-CM | POA: Diagnosis not present

## 2021-03-13 DIAGNOSIS — I69320 Aphasia following cerebral infarction: Secondary | ICD-10-CM | POA: Diagnosis not present

## 2021-03-13 DIAGNOSIS — I4510 Unspecified right bundle-branch block: Secondary | ICD-10-CM | POA: Diagnosis not present

## 2021-03-13 DIAGNOSIS — R54 Age-related physical debility: Secondary | ICD-10-CM | POA: Diagnosis not present

## 2021-03-13 DIAGNOSIS — E871 Hypo-osmolality and hyponatremia: Secondary | ICD-10-CM | POA: Diagnosis not present

## 2021-03-13 DIAGNOSIS — I69920 Aphasia following unspecified cerebrovascular disease: Secondary | ICD-10-CM | POA: Diagnosis not present

## 2021-03-13 DIAGNOSIS — E46 Unspecified protein-calorie malnutrition: Secondary | ICD-10-CM | POA: Diagnosis not present

## 2021-03-13 DIAGNOSIS — I10 Essential (primary) hypertension: Secondary | ICD-10-CM | POA: Diagnosis not present

## 2021-03-13 DIAGNOSIS — R1310 Dysphagia, unspecified: Secondary | ICD-10-CM | POA: Diagnosis not present

## 2021-03-13 DIAGNOSIS — I639 Cerebral infarction, unspecified: Secondary | ICD-10-CM | POA: Diagnosis not present

## 2021-03-13 DIAGNOSIS — N319 Neuromuscular dysfunction of bladder, unspecified: Secondary | ICD-10-CM | POA: Diagnosis not present

## 2021-03-13 DIAGNOSIS — R778 Other specified abnormalities of plasma proteins: Secondary | ICD-10-CM | POA: Diagnosis not present

## 2021-03-13 DIAGNOSIS — M150 Primary generalized (osteo)arthritis: Secondary | ICD-10-CM | POA: Diagnosis not present

## 2021-03-13 DIAGNOSIS — L89616 Pressure-induced deep tissue damage of right heel: Secondary | ICD-10-CM | POA: Diagnosis not present

## 2021-03-13 DIAGNOSIS — Z743 Need for continuous supervision: Secondary | ICD-10-CM | POA: Diagnosis not present

## 2021-03-13 DIAGNOSIS — I69351 Hemiplegia and hemiparesis following cerebral infarction affecting right dominant side: Secondary | ICD-10-CM | POA: Diagnosis not present

## 2021-03-13 DIAGNOSIS — Z9181 History of falling: Secondary | ICD-10-CM | POA: Diagnosis not present

## 2021-03-13 DIAGNOSIS — M6282 Rhabdomyolysis: Secondary | ICD-10-CM | POA: Diagnosis not present

## 2021-03-13 DIAGNOSIS — E782 Mixed hyperlipidemia: Secondary | ICD-10-CM | POA: Diagnosis not present

## 2021-03-13 DIAGNOSIS — D649 Anemia, unspecified: Secondary | ICD-10-CM | POA: Diagnosis not present

## 2021-03-13 DIAGNOSIS — Z960 Presence of urogenital implants: Secondary | ICD-10-CM | POA: Diagnosis not present

## 2021-03-13 DIAGNOSIS — L988 Other specified disorders of the skin and subcutaneous tissue: Secondary | ICD-10-CM | POA: Diagnosis not present

## 2021-03-13 DIAGNOSIS — E785 Hyperlipidemia, unspecified: Secondary | ICD-10-CM | POA: Diagnosis not present

## 2021-03-13 DIAGNOSIS — R2681 Unsteadiness on feet: Secondary | ICD-10-CM | POA: Diagnosis not present

## 2021-03-13 DIAGNOSIS — L03113 Cellulitis of right upper limb: Secondary | ICD-10-CM | POA: Diagnosis not present

## 2021-03-13 DIAGNOSIS — F3289 Other specified depressive episodes: Secondary | ICD-10-CM | POA: Diagnosis not present

## 2021-03-13 DIAGNOSIS — J918 Pleural effusion in other conditions classified elsewhere: Secondary | ICD-10-CM | POA: Diagnosis not present

## 2021-03-13 DIAGNOSIS — S71101D Unspecified open wound, right thigh, subsequent encounter: Secondary | ICD-10-CM | POA: Diagnosis not present

## 2021-03-13 DIAGNOSIS — I69391 Dysphagia following cerebral infarction: Secondary | ICD-10-CM | POA: Diagnosis not present

## 2021-03-13 DIAGNOSIS — I350 Nonrheumatic aortic (valve) stenosis: Secondary | ICD-10-CM | POA: Diagnosis not present

## 2021-03-13 DIAGNOSIS — M6281 Muscle weakness (generalized): Secondary | ICD-10-CM | POA: Diagnosis not present

## 2021-03-13 DIAGNOSIS — Z8673 Personal history of transient ischemic attack (TIA), and cerebral infarction without residual deficits: Secondary | ICD-10-CM | POA: Diagnosis not present

## 2021-03-13 LAB — CBC WITH DIFFERENTIAL/PLATELET
Abs Immature Granulocytes: 0.03 10*3/uL (ref 0.00–0.07)
Basophils Absolute: 0.1 10*3/uL (ref 0.0–0.1)
Basophils Relative: 1 %
Eosinophils Absolute: 0.3 10*3/uL (ref 0.0–0.5)
Eosinophils Relative: 4 %
HCT: 30.2 % — ABNORMAL LOW (ref 36.0–46.0)
Hemoglobin: 9.7 g/dL — ABNORMAL LOW (ref 12.0–15.0)
Immature Granulocytes: 0 %
Lymphocytes Relative: 18 %
Lymphs Abs: 1.7 10*3/uL (ref 0.7–4.0)
MCH: 27.3 pg (ref 26.0–34.0)
MCHC: 32.1 g/dL (ref 30.0–36.0)
MCV: 85.1 fL (ref 80.0–100.0)
Monocytes Absolute: 1.2 10*3/uL — ABNORMAL HIGH (ref 0.1–1.0)
Monocytes Relative: 13 %
Neutro Abs: 5.8 10*3/uL (ref 1.7–7.7)
Neutrophils Relative %: 64 %
Platelets: 435 10*3/uL — ABNORMAL HIGH (ref 150–400)
RBC: 3.55 MIL/uL — ABNORMAL LOW (ref 3.87–5.11)
RDW: 14.3 % (ref 11.5–15.5)
WBC: 9.2 10*3/uL (ref 4.0–10.5)
nRBC: 0 % (ref 0.0–0.2)

## 2021-03-13 LAB — COMPREHENSIVE METABOLIC PANEL
ALT: 25 U/L (ref 0–44)
AST: 24 U/L (ref 15–41)
Albumin: 2.1 g/dL — ABNORMAL LOW (ref 3.5–5.0)
Alkaline Phosphatase: 49 U/L (ref 38–126)
Anion gap: 6 (ref 5–15)
BUN: 13 mg/dL (ref 8–23)
CO2: 25 mmol/L (ref 22–32)
Calcium: 8.2 mg/dL — ABNORMAL LOW (ref 8.9–10.3)
Chloride: 103 mmol/L (ref 98–111)
Creatinine, Ser: 0.56 mg/dL (ref 0.44–1.00)
GFR, Estimated: >60 mL/min (ref >60)
Glucose, Bld: 119 mg/dL — ABNORMAL HIGH (ref 70–99)
Potassium: 4.2 mmol/L (ref 3.5–5.1)
Sodium: 134 mmol/L — ABNORMAL LOW (ref 135–145)
Total Bilirubin: 0.5 mg/dL (ref 0.3–1.2)
Total Protein: 5.1 g/dL — ABNORMAL LOW (ref 6.5–8.1)

## 2021-03-13 LAB — RESP PANEL BY RT-PCR (FLU A&B, COVID) ARPGX2
Influenza A by PCR: NEGATIVE
Influenza B by PCR: NEGATIVE
SARS Coronavirus 2 by RT PCR: NEGATIVE

## 2021-03-13 LAB — MAGNESIUM: Magnesium: 2 mg/dL (ref 1.7–2.4)

## 2021-03-13 LAB — BRAIN NATRIURETIC PEPTIDE: B Natriuretic Peptide: 265.9 pg/mL — ABNORMAL HIGH (ref 0.0–100.0)

## 2021-03-13 LAB — PROCALCITONIN: Procalcitonin: <0.1 ng/mL

## 2021-03-13 MED ORDER — ROSUVASTATIN CALCIUM 20 MG PO TABS: 20.0000 mg | ORAL_TABLET | Freq: Every day | ORAL | Status: DC

## 2021-03-13 MED ORDER — LACTATED RINGERS IV BOLUS
500.0000 mL | Freq: Once | INTRAVENOUS | Status: AC
  Administered 2021-03-13: 500 mL via INTRAVENOUS

## 2021-03-13 NOTE — Progress Notes (Signed)
Physical Therapy Treatment Patient Details Name: Cathy Silva MRN: [id number] DOB: 1929-10-30 Today's Date: 03/13/2021   History of Present Illness Pt is an 87 y/o female admitted from SNF on 11/7 secondary to R arm weakness. Found to have L MCA infarct. Concern for new stroke-like symptoms on 11/11 and CTH showing no hemorrhagic transformation or evidence of new ischemia. PMH includes HTN.    PT Comments    Patient received in bed, confused. Asking about things, but I am unable to determine what she is trying to say. Patient is agreeable to PT session. Requires +2 mod/max assist for all mobility. Heavy right leaning in sitting and standing. Patient performed pivot to recliner with +2 max assist. She will continue to benefit from skilled PT while here to improve independence and safety with mobility.     Recommendations for follow up therapy are one component of a multi-disciplinary discharge planning process, led by the attending physician.  Recommendations may be updated based on patient status, additional functional criteria and insurance authorization.  Follow Up Recommendations  Skilled nursing-short term rehab (<3 hours/day)     Assistance Recommended at Discharge Frequent or constant Supervision/Assistance  Equipment Recommendations  None recommended by PT    Recommendations for Other Services       Precautions / Restrictions Precautions Precautions: Fall Precaution Comments: RUE/LE hemiplegia and decreased sensation, HoH with HA, chronic foley Restrictions Weight Bearing Restrictions: No     Mobility  Bed Mobility Overal bed mobility: Needs Assistance Bed Mobility: Supine to Sit;Sidelying to Sit   Sidelying to sit: Mod assist;+2 for physical assistance Supine to sit: +2 for physical assistance;Mod assist     General bed mobility comments: dense cues, increased time to initiate, assist at trunk and BLE to perform. Heavy R leaning in sitting     Transfers Overall transfer level: Needs assistance Equipment used: 2 person hand held assist Transfers: Sit to/from Stand;Bed to chair/wheelchair/BSC Sit to Stand: Max assist;+2 physical assistance Stand pivot transfers: Max assist;+2 physical assistance              Ambulation/Gait               General Gait Details: unable   Stairs             Wheelchair Mobility    Modified Rankin (Stroke Patients Only) Modified Rankin (Stroke Patients Only) Pre-Morbid Rankin Score: Moderate disability Modified Rankin: Severe disability     Balance Overall balance assessment: Needs assistance Sitting-balance support: Feet supported;Single extremity supported Sitting balance-Leahy Scale: Fair Sitting balance - Comments: R lateral lean when not holding to bed with L UE Postural control: Right lateral lean Standing balance support: During functional activity;Bilateral upper extremity supported   Standing balance comment: +2 maxA for static standing with heavy R lean within R PF RW                            Cognition Arousal/Alertness: Awake/alert Behavior During Therapy: WFL for tasks assessed/performed Overall Cognitive Status: Impaired/Different from baseline Area of Impairment: Awareness;Attention;Safety/judgement;Problem solving                   Current Attention Level: Sustained Memory: Decreased short-term memory Following Commands: Follows one step commands with increased time Safety/Judgement: Decreased awareness of safety;Decreased awareness of deficits Awareness: Intellectual Problem Solving: Slow processing;Decreased initiation;Difficulty sequencing;Requires verbal cues;Requires tactile cues General Comments: patient confused. asking about things and i cannot figure out what she  is trying to say        Exercises      General Comments        Pertinent Vitals/Pain Pain Assessment: Faces Faces Pain Scale: Hurts a little  bit Pain Location: "all over", pt seems to express pain with all functional mobility tasks, bruises noted on B forearms and L knee (also present previous session) Pain Descriptors / Indicators: Discomfort;Grimacing;Guarding;Moaning Pain Intervention(s): Monitored during session;Limited activity within patient's tolerance;Repositioned    Home Living                          Prior Function            PT Goals (current goals can now be found in the care plan section) Acute Rehab PT Goals Patient Stated Goal: unable to state today PT Goal Formulation: Patient unable to participate in goal setting Time For Goal Achievement: 03/20/21 Progress towards PT goals: Not progressing toward goals - comment    Frequency    Min 3X/week      PT Plan Current plan remains appropriate    Co-evaluation              AM-PAC PT "6 Clicks" Mobility   Outcome Measure  Help needed turning from your back to your side while in a flat bed without using bedrails?: A Lot Help needed moving from lying on your back to sitting on the side of a flat bed without using bedrails?: Total Help needed moving to and from a bed to a chair (including a wheelchair)?: Total Help needed standing up from a chair using your arms (e.g., wheelchair or bedside chair)?: Total Help needed to walk in hospital room?: Total Help needed climbing 3-5 steps with a railing? : Total 6 Click Score: 7    End of Session Equipment Utilized During Treatment: Gait belt Activity Tolerance: Patient limited by fatigue;Patient limited by pain Patient left: in chair;with chair alarm set;with call bell/phone within reach Nurse Communication: Mobility status PT Visit Diagnosis: Muscle weakness (generalized) (M62.81);Unsteadiness on feet (R26.81);Hemiplegia and hemiparesis Hemiplegia - Right/Left: Right Hemiplegia - dominant/non-dominant: Dominant Hemiplegia - caused by: Cerebral infarction     Time: 1000-1018 PT Time  Calculation (min) (ACUTE ONLY): 18 min  Charges:  $Therapeutic Activity: 8-22 mins                     Miria Cappelli, PT, GCS 03/13/21,11:12 AM

## 2021-03-13 NOTE — Progress Notes (Signed)
Pt BP at 730 114/103, MAP 109, other vitals WNL, MD Singh notified.

## 2021-03-13 NOTE — Progress Notes (Signed)
Report called to [place], [place], to [place], Therapist, sports. All questions answered.

## 2021-03-13 NOTE — Progress Notes (Signed)
Patient medically stable for discharge per MD order. Report called to [place] at [place], all questions answered. IV and tele removed. Belongings: necklace/medallion, hearing aids (2), glasses, glasses case and hearing aids case, protein shakes, fruit cups, nightgown (2), flowers (2), note pad with schedule, magazines. Pt leaving for [place].

## 2021-03-13 NOTE — TOC Transition Note (Signed)
Transition of Care [facility]) - CM/SW Discharge Note   Patient Details  Name: MICHON KACZMAREK MRN: [id number] Date of Birth: [birth year]  [facility] CM/SW Contact:  Geralynn Ochs, LCSW Phone Number: 03/13/2021, 11:31 AM   Clinical Narrative:   Nurse to call report to [phone], Room 206.  Transport scheduled for 3:30 PM.    Final next level of care: [place] Barriers to Discharge: Barriers Resolved   Patient Goals and CMS Choice   CMS Medicare.gov Compare Post Acute Care list provided to:: Patient Choice offered to / list presented to : Patient  Discharge Placement              Patient chooses bed at: [facility] Patient to be transferred to facility by: [facility] Name of family member notified: Lattie Haw Patient and family notified of of transfer: 03/13/21  Discharge Plan and Services     Post Acute Care Choice: [place]                               Social Determinants of Health (SDOH) Interventions     Readmission Risk Interventions No flowsheet data found.

## 2021-03-13 NOTE — Discharge Summary (Signed)
Cathy Silva:[id number] DOB: [birth year] DOA: 03/05/2021  PCP: [name], MD  Admit date: 03/05/2021  Discharge date: 03/13/2021  Admitted From: SNF   Disposition:  SNF   Recommendations for Outpatient Follow-up:   Follow up with PCP in 1-2 weeks  PCP Please obtain BMP/CBC, 2 view CXR in 1week,  (see Discharge instructions)   PCP Please follow up on the following pending results: Permissive hypertension for the next 2 days then gradually titrate down blood pressure lower.   Home Health: None   Equipment/Devices: None  Consultations: Neuro Discharge Condition: Stable    CODE STATUS: Full    Diet Recommendation: Heart Healthy   Diet Order             Diet Heart Room service appropriate? Yes; Fluid consistency: Thin; Fluid restriction: 1200 mL Fluid  Diet effective now                    Chief Complaint  Patient presents with   Weakness   Stroke Symptoms     Brief history of present illness from the day of admission and additional interim summary    Cathy Silva is a 87 y.o. female with medical history significant for HTN, anemia, hyponatremia, recent admission for rhabdomyolysis after fall at home.  She was discharged last week to a skilled rehabilitation facility.  Her niece was visiting her every day on Saturday afternoon she noted that Cathy Silva was not acting normally and had slurred speech and right facial droop and she was not able to move or use her right arm or hand.                                                                   Hospital Course    Acute metabolic encephalopathy with right-sided weakness and slurred speech due to acute CVA. - she has been seen by neurology team and full stroke work-up per neurology and stroke team.  Currently on aspirin, statin added  for better LDL control, A1c stable.  She has significant weakness in her right arm and she is right-handed, will require placement as she lives alone, continue PT OT and supportive care, symptoms suggest some further evolution of her CVA in the last 24 hrs, DW Neuro and family 03/11/21, continue present line of care with statin and aspirin.  She is today showing signs of gradual improvement, if there is any significant decline we will focus on keeping her comfortable.  Continue gentle medical treatment but no heroics.   2.  Dyslipidemia.  Placed on statin.   3.  Hyponatremia.  SIADH, worse after IV fluids, free water restriction, post Lasix 1 dose on 03/07/2021, Samsca on 03/09/21, sodium better post Samsca.  Intermittently monitor.   4.  Hypertension.  For  now permissive hypertension due to stroke, gently titrate down blood pressure after 03/15/2021.   5.  Urinary outflow obstruction present on admission.  Came in with Foley catheter along with Flomax.  This was placed about 2 weeks ago according to family, trial of Foley removal failed on 03/10/2021 and it was replaced on 03/10/2021.  She will go out with Foley and Flomax with outpatient urology follow-up to be arranged by SNF.   Discharge diagnosis     Principal Problem:   CVA (cerebral vascular accident) [facility]) Active Problems:   Hyponatremia   Essential hypertension   Normocytic normochromic anemia   Acute CVA (cerebrovascular accident) [facility])    Discharge instructions    Discharge Instructions     Ambulatory referral to Neurology   Complete by: As directed    Follow up with stroke clinic NP (Jessica Vanschaick or Cecille Rubin, if both not available, consider Zachery Dauer, or Ahern) at [facility] in about 4 weeks. Thanks.   Discharge instructions   Complete by: As directed    Get CBC, CMP, 2 view Chest X ray -  checked next visit within 1 week by Primary MD or SNF MD   Activity: As tolerated with Full fall precautions use  walker/cane & assistance as needed  Disposition SNF  Diet: Heart Healthy   Special Instructions: If you have smoked or chewed Tobacco  in the last 2 yrs please stop smoking, stop any regular Alcohol  and or any Recreational drug use.  On your next visit with your primary care physician please Get Medicines reviewed and adjusted.  Please request your Prim.MD to go over all Hospital Tests and Procedure/Radiological results at the follow up, please get all Hospital records sent to your Prim MD by signing hospital release before you go home.  If you experience worsening of your admission symptoms, develop shortness of breath, life threatening emergency, suicidal or homicidal thoughts you must seek medical attention immediately by calling 911 or calling your MD immediately  if symptoms less severe.  You Must read complete instructions/literature along with all the possible adverse reactions/side effects for all the Medicines you take and that have been prescribed to you. Take any new Medicines after you have completely understood and accpet all the possible adverse reactions/side effects.   Increase activity slowly   Complete by: As directed    No wound care   Complete by: As directed        Discharge Medications   Allergies as of 03/13/2021       Reactions   Novocain [procaine] Shortness Of Breath, Other (See Comments)   "Deathly allergic to this"   Codeine Other (See Comments)   Reaction not recalled by the patient- stated it was "a long time ago"   Pneumococcal Vaccines Other (See Comments)   Reaction not recalled by the patient- stated it was "a long time ago"   Pseudoephedrine Other (See Comments)   Reaction not recalled by the patient- stated it was "a long time ago"   Tetanus Toxoids Swelling, Other (See Comments)   Swelling at injecton site   Tetanus-diphtheria Toxoids Td Swelling, Other (See Comments)   Swelling where injected        Medication List     STOP taking  these medications    amLODipine 5 MG tablet Commonly known as: NORVASC   carvedilol 6.25 MG tablet Commonly known as: COREG   levofloxacin 250 MG tablet Commonly known as: LEVAQUIN       TAKE these  medications    aspirin 81 MG EC tablet Take 1 tablet (81 mg total) by mouth daily. Swallow whole.   CALCIUM + D PO Take 1,200 mg by mouth.   feeding supplement Liqd Take 1 Container by mouth 3 (three) times daily between meals. What changed: when to take this   Fish Oil 600 MG Caps Take 600 mg by mouth in the morning and at bedtime.   Multivitamin Adult Chew Chew 1 tablet by mouth daily.   MULTIVITAMIN PO Take 1 tablet by mouth daily with breakfast.   rosuvastatin 20 MG tablet Commonly known as: CRESTOR Take 1 tablet (20 mg total) by mouth daily.   tamsulosin 0.4 MG Caps capsule Commonly known as: FLOMAX Take 1 capsule (0.4 mg total) by mouth daily.   vitamin C 1000 MG tablet Take 1,000 mg by mouth daily.         Follow-up Information     [facility]. Schedule an appointment as soon as possible for a visit in 1 month(s).   Specialty: Neurology Why: stroke clinic Contact information: [address] [place] [phone]        Dorthy Cooler, [place], MD. Schedule an appointment as soon as possible for a visit in 1 week(s).   Specialty: Family Medicine Contact information: [place] [address] [address] [phone]         Werner Lean, MD .   Specialty: Cardiology Contact information: [place] [id number] [phone]                 Major procedures and Radiology Reports - PLEASE review detailed and final reports thoroughly  -       DG Elbow Complete Left  Result Date: 02/24/2021 CLINICAL DATA:  Status post fall, elbow pain EXAM: LEFT ELBOW - COMPLETE 3+ VIEW COMPARISON:  None. FINDINGS: Subtle lucency involving the  superior corner of the olecranon most concerning for a nondisplaced fracture. No other fracture or dislocation. No aggressive osseous lesion. Normal alignment. Soft tissue are unremarkable. No radiopaque foreign body or soft tissue emphysema. IMPRESSION: 1. Subtle lucency involving the superior corner of the olecranon most concerning for a nondisplaced fracture. Electronically Signed   By: Kathreen Devoid M.D.   On: 02/24/2021 12:44   CT HEAD WO CONTRAST (5MM)  Result Date: 03/11/2021 CLINICAL DATA:  Stroke, follow-up. EXAM: CT HEAD WITHOUT CONTRAST TECHNIQUE: Contiguous axial images were obtained from the base of the skull through the vertex without intravenous contrast. COMPARISON:  Prior head CT examinations 03/09/2021 and earlier. Brain MRI 03/05/2021. FINDINGS: Brain: Motion degraded exam. Mild generalized cerebral atrophy. Evolving acute/early subacute infarcts within the cortical/subcortical left frontoparietal lobes and right corona radiata, not significantly changed in extent as compared to the prior head CT of 03/09/2021. No significant mass effect. No evidence of hemorrhagic conversion. Stable background mild chronic small vessel ischemic changes. No new demarcated cortical infarction is identified. No extra-axial fluid collection. No evidence of an intracranial mass. No midline shift. Vascular: No hyperdense vessel.  Atherosclerotic calcifications. Skull: Normal. Negative for fracture or focal lesion. Sinuses/Orbits: Visualized orbits show no acute finding. No significant paranasal sinus disease at the imaged levels. IMPRESSION: Evolving acute/early subacute bilateral MCA territory infarcts within the cortical/subcortical left frontoparietal lobes and right corona radiata, not significantly changed in extent as compared to the head CT of 03/09/2021. No significant mass effect. No evidence of hemorrhagic conversion. Stable background mild chronic small vessel  ischemic changes within the cerebral white  matter. Mild generalized cerebral atrophy. Electronically Signed   By: Kellie Simmering D.O.   On: 03/11/2021 16:12   CT HEAD WO CONTRAST (5MM)  Result Date: 03/09/2021 CLINICAL DATA:  Cerebral hemorrhage suspected Stroke-like symptoms.  Recent infarct on MRI. EXAM: CT HEAD WITHOUT CONTRAST TECHNIQUE: Contiguous axial images were obtained from the base of the skull through the vertex without intravenous contrast. COMPARISON:  Head CT 03/07/2021, brain MRI 03/05/2021. FINDINGS: Brain: No acute intracranial hemorrhage. Expected evolution of recent bilateral infarcts with developing encephalomalacia. No hemorrhagic transformation. No evidence of new ischemia. No subdural or extra-axial collection. Generalized atrophy and periventricular chronic small vessel ischemia is again seen. No hydrocephalus, midline shift, or mass effect. Vascular: Skull base atherosclerosis without hyperdense vessel. No suspicious calcification. Skull: No fracture or focal lesion. Sinuses/Orbits: Paranasal sinuses and mastoid air cells are clear. The visualized orbits are unremarkable. Bilateral cataract resection Other: None. IMPRESSION: 1. Expected evolution of recent bilateral infarcts with developing encephalomalacia. No hemorrhagic transformation or evidence of new ischemia. 2. Stable atrophy and chronic small vessel ischemia. Electronically Signed   By: Keith Rake M.D.   On: 03/09/2021 20:13   CT HEAD WO CONTRAST (5MM)  Result Date: 03/07/2021 CLINICAL DATA:  Stroke, follow up EXAM: CT HEAD WITHOUT CONTRAST TECHNIQUE: Contiguous axial images were obtained from the base of the skull through the vertex without intravenous contrast. COMPARISON:  MRI 03/05/2021, head CT 03/05/2021 FINDINGS: Brain: There is mild loss of gray-white matter differentiation in the left MCA territory in the posterior left frontal and the parietal lobe consistent with infarct, as seen on MRI. Hypoattenuation in the right frontal corona radiata extending  towards the insula also consistent with infarct. These findings are increased in conspicuity in comparison to prior CT.No concerning mass effect. No acute intracranial hemorrhage. The basal cisterns are patent.The ventricles are unchanged in size.Scattered subcortical and periventricular white matter hypodensities, nonspecific but likely sequela of chronic small vessel ischemic disease.Mild cerebral atrophy Vascular: Vertebral artery calcifications. Skull: Normal. Negative for fracture or focal lesion. Sinuses/Orbits: No acute finding. Other: None. IMPRESSION: Evolving left MCA territory infarct involving the posterior left frontoparietal region and additional infarct in the right frontal corona radiata. No concerning mass effect. No acute intracranial hemorrhage. Underlying cerebral atrophy with unchanged mild sequela of chronic small vessel ischemic disease. Electronically Signed   By: Maurine Simmering M.D.   On: 03/07/2021 09:18   CT HEAD WO CONTRAST  Result Date: 03/05/2021 CLINICAL DATA:  Slurred speech.  Right arm weakness. EXAM: CT HEAD WITHOUT CONTRAST TECHNIQUE: Contiguous axial images were obtained from the base of the skull through the vertex without intravenous contrast. COMPARISON:  02/24/2021 FINDINGS: Brain: Moderate low density in the periventricular white matter likely related to small vessel disease. Expected cerebral and cerebellar atrophy for age. Similar subtle hypoattenuation in the right insula likely due to remote infarct. No mass lesion, hemorrhage, hydrocephalus, acute infarct, intra-axial, or extra-axial fluid collection. Vascular: Intracranial atherosclerosis. No hyperdense vessel or unexpected calcification. Skull: Normal Sinuses/Orbits: Normal imaged portions of the orbits and globes. Clear paranasal sinuses and mastoid air cells. Other: None. IMPRESSION: No acute intracranial abnormality. Cerebral atrophy and small vessel ischemic change. Electronically Signed   By: Abigail Miyamoto M.D.    On: 03/05/2021 11:30   CT Head Wo Contrast  Result Date: 02/24/2021 CLINICAL DATA:  87 year old female with acute fall and head injury. Initial encounter. EXAM: CT HEAD WITHOUT CONTRAST TECHNIQUE: Contiguous axial images were obtained from  the base of the skull through the vertex without intravenous contrast. COMPARISON:  None. FINDINGS: Brain: Obscuration of a portion of the RIGHT insular cortex is noted (series 3: Image 15-16) compatible with an infarct of uncertain chronicity, but appears more remote. No evidence of hemorrhage, hydrocephalus, extra-axial collection or mass lesion/mass effect. Atrophy identified. Mild periventricular white matter hypodensities likely represent chronic small vessel white matter ischemic changes. Vascular: Carotid and vertebral atherosclerotic calcifications are noted. Skull: No acute abnormality. Sinuses/Orbits: No acute abnormality Other: None IMPRESSION: 1. 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 Correlate clinically. No evidence of hemorrhage. 2. Atrophy and probable chronic small vessel white matter ischemic changes. Electronically Signed   By: Margarette Canada M.D.   On: 02/24/2021 12:01   MR ANGIO HEAD WO CONTRAST  Result Date: 03/05/2021 CLINICAL DATA:  Initial evaluation for slurred speech, right arm weakness, stroke. EXAM: MRI HEAD WITHOUT CONTRAST MRA HEAD WITHOUT CONTRAST TECHNIQUE: Multiplanar, multi-echo pulse sequences of the brain and surrounding structures were acquired without intravenous contrast. Angiographic images of the Circle of Willis were acquired using MRA technique without intravenous contrast. COMPARISON:  Prior head CT from earlier the same day. FINDINGS: MRI HEAD FINDINGS Brain: Mild diffuse prominence of the CSF containing spaces compatible generalized age-related cerebral atrophy. Patchy and confluent T2/FLAIR hyperintensity involving the periventricular deep white matter both cerebral hemispheres as well as  the pons, most consistent with chronic small vessel ischemic disease, mild in nature. Patchy areas of restricted diffusion involving the cortical and subcortical aspect of the posterior left frontal and parietal lobes consistent with acute left MCA territory infarct (series 5, image 83). Mild patchy involvement of the left insula as well as the left pre and postcentral gyri. No associated hemorrhage or mass effect. There is an additional acute to early subacute ischemic infarct involving the right frontal corona radiata with extension towards the right insula (series 5, images 88, 83). Associated minimal petechial hemorrhage without frank hemorrhagic transformation or significant mass effect. Otherwise, gray-white matter differentiation maintained with no other evidence for acute or subacute ischemia. No encephalomalacia to suggest chronic cortical infarction elsewhere within the brain. No other evidence for acute or chronic intracranial hemorrhage. No mass lesion, midline shift or mass effect. No hydrocephalus or extra-axial fluid collection. Pituitary gland suprasellar region within normal limits. Midline structures intact. Vascular: Major intracranial vascular flow voids are maintained. Skull and upper cervical spine: Craniocervical junction within normal limits. Bone marrow signal intensity normal. No scalp soft tissue abnormality. Sinuses/Orbits: Patient status post bilateral ocular lens replacement. Globes and orbital soft tissues demonstrate no acute finding. Paranasal sinuses are largely clear. No mastoid effusion. Inner ear structures grossly normal. Other: None. MRA HEAD FINDINGS Anterior circulation: Visualized distal cervical segments of both internal carotid arteries patent with antegrade flow. Distal cervical right ICA tortuous. Petrous segments patent bilaterally. Moderate atheromatous irregularity throughout the carotid siphons without high-grade stenosis. Severe stenosis noted at the mid left A1  segment (series 1041, image 5). A1 segments otherwise patent. Normal anterior communicating artery complex. Right ACA irregular but patent to its distal aspect without stenosis. Short-segment severe left A2 stenosis (a series 5, image 143). Left ACA patent distally. Atheromatous irregularity within the M1 segments bilaterally without high-grade stenosis. Normal MCA bifurcations. Probable left M3 branch occlusion noted (series 5, image 164), in keeping with the acute left MCA distribution infarct. Moderate atheromatous change seen elsewhere throughout the distal MCA branches bilaterally. Posterior circulation: Both V4 segments patent to the vertebrobasilar junction  without stenosis. Both PICA origins patent and normal. Basilar patent to its distal aspect without high-grade stenosis. Superior cerebellar arteries patent bilaterally. Both PCAs primarily supplied via the basilar. Atheromatous irregularity throughout both PCAs with associated moderate to severe bilateral distal P3/P4 stenoses. Anatomic variants: None significant.  No visible aneurysm. IMPRESSION: MRI HEAD IMPRESSION: 1. Patchy acute ischemic nonhemorrhagic left MCA territory infarct involving the posterior left frontoparietal region as above. No associated mass effect. 2. Additional acute to early subacute ischemic infarct involving the right frontal corona radiata. Associated minimal petechial hemorrhage without frank hemorrhagic transformation or significant mass effect. 3. Underlying age-related cerebral atrophy with mild chronic small vessel ischemic disease. MRA HEAD IMPRESSION: 1. Left M3 branch occlusion, in keeping with the acute left MCA territory infarct. 2. Moderate intracranial atherosclerotic change elsewhere throughout the intracranial circulation as above. Additional notable findings include severe left A1 and A2 stenoses, with moderate to severe bilateral distal P3 and P4 stenoses. Electronically Signed   By: Jeannine Boga M.D.    On: 03/05/2021 22:58   MR BRAIN WO CONTRAST  Result Date: 03/05/2021 CLINICAL DATA:  Initial evaluation for slurred speech, right arm weakness, stroke. EXAM: MRI HEAD WITHOUT CONTRAST MRA HEAD WITHOUT CONTRAST TECHNIQUE: Multiplanar, multi-echo pulse sequences of the brain and surrounding structures were acquired without intravenous contrast. Angiographic images of the Circle of Willis were acquired using MRA technique without intravenous contrast. COMPARISON:  Prior head CT from earlier the same day. FINDINGS: MRI HEAD FINDINGS Brain: Mild diffuse prominence of the CSF containing spaces compatible generalized age-related cerebral atrophy. Patchy and confluent T2/FLAIR hyperintensity involving the periventricular deep white matter both cerebral hemispheres as well as the pons, most consistent with chronic small vessel ischemic disease, mild in nature. Patchy areas of restricted diffusion involving the cortical and subcortical aspect of the posterior left frontal and parietal lobes consistent with acute left MCA territory infarct (series 5, image 83). Mild patchy involvement of the left insula as well as the left pre and postcentral gyri. No associated hemorrhage or mass effect. There is an additional acute to early subacute ischemic infarct involving the right frontal corona radiata with extension towards the right insula (series 5, images 88, 83). Associated minimal petechial hemorrhage without frank hemorrhagic transformation or significant mass effect. Otherwise, gray-white matter differentiation maintained with no other evidence for acute or subacute ischemia. No encephalomalacia to suggest chronic cortical infarction elsewhere within the brain. No other evidence for acute or chronic intracranial hemorrhage. No mass lesion, midline shift or mass effect. No hydrocephalus or extra-axial fluid collection. Pituitary gland suprasellar region within normal limits. Midline structures intact. Vascular: Major  intracranial vascular flow voids are maintained. Skull and upper cervical spine: Craniocervical junction within normal limits. Bone marrow signal intensity normal. No scalp soft tissue abnormality. Sinuses/Orbits: Patient status post bilateral ocular lens replacement. Globes and orbital soft tissues demonstrate no acute finding. Paranasal sinuses are largely clear. No mastoid effusion. Inner ear structures grossly normal. Other: None. MRA HEAD FINDINGS Anterior circulation: Visualized distal cervical segments of both internal carotid arteries patent with antegrade flow. Distal cervical right ICA tortuous. Petrous segments patent bilaterally. Moderate atheromatous irregularity throughout the carotid siphons without high-grade stenosis. Severe stenosis noted at the mid left A1 segment (series 1041, image 5). A1 segments otherwise patent. Normal anterior communicating artery complex. Right ACA irregular but patent to its distal aspect without stenosis. Short-segment severe left A2 stenosis (a series 5, image 143). Left ACA patent distally. Atheromatous irregularity within the M1 segments bilaterally without  high-grade stenosis. Normal MCA bifurcations. Probable left M3 branch occlusion noted (series 5, image 164), in keeping with the acute left MCA distribution infarct. Moderate atheromatous change seen elsewhere throughout the distal MCA branches bilaterally. Posterior circulation: Both V4 segments patent to the vertebrobasilar junction without stenosis. Both PICA origins patent and normal. Basilar patent to its distal aspect without high-grade stenosis. Superior cerebellar arteries patent bilaterally. Both PCAs primarily supplied via the basilar. Atheromatous irregularity throughout both PCAs with associated moderate to severe bilateral distal P3/P4 stenoses. Anatomic variants: None significant.  No visible aneurysm. IMPRESSION: MRI HEAD IMPRESSION: 1. Patchy acute ischemic nonhemorrhagic left MCA territory infarct  involving the posterior left frontoparietal region as above. No associated mass effect. 2. Additional acute to early subacute ischemic infarct involving the right frontal corona radiata. Associated minimal petechial hemorrhage without frank hemorrhagic transformation or significant mass effect. 3. Underlying age-related cerebral atrophy with mild chronic small vessel ischemic disease. MRA HEAD IMPRESSION: 1. Left M3 branch occlusion, in keeping with the acute left MCA territory infarct. 2. Moderate intracranial atherosclerotic change elsewhere throughout the intracranial circulation as above. Additional notable findings include severe left A1 and A2 stenoses, with moderate to severe bilateral distal P3 and P4 stenoses. Electronically Signed   By: Jeannine Boga M.D.   On: 03/05/2021 22:58   US RENAL  Result Date: 02/27/2021 CLINICAL DATA:  Urinary retention EXAM: RENAL / URINARY TRACT ULTRASOUND COMPLETE COMPARISON:  None. FINDINGS: Right Kidney: Renal measurements: 9.7 x 4.7 x 5.2 cm = volume: 124 mL. Echogenicity within normal limits. No mass or hydronephrosis visualized. Left Kidney: Renal measurements: 10.9 x 4.5 x 4.6 cm = volume: 116 mL. Echogenicity within normal limits. There is a 1.1 x 0.8 x 0.9 cm simple cyst within the left kidney. No solid mass or hydronephrosis visualized. Urinary bladder: Appears normal for degree of bladder distention. Foley catheter noted within the urinary bladder lumen. Other: None. IMPRESSION: Unremarkable renal ultrasound. Electronically Signed   By: Iven Finn M.D.   On: 02/27/2021 15:03   DG Chest Port 1 View  Result Date: 03/12/2021 CLINICAL DATA:  Shortness of breath EXAM: PORTABLE CHEST 1 VIEW COMPARISON:  Chest x-ray dated March 11, 2021 FINDINGS: Cardiac and mediastinal contours unchanged. Mild left basilar atelectasis. No large pleural effusion or evidence of pneumothorax. IMPRESSION: Mild left basilar atelectasis, lungs otherwise clear.  Electronically Signed   By: Yetta Glassman M.D.   On: 03/12/2021 08:05   DG CHEST PORT 1 VIEW  Result Date: 03/11/2021 CLINICAL DATA:  Weakness; stroke symptoms.  Leukocytosis. EXAM: PORTABLE CHEST 1 VIEW COMPARISON:  Prior chest radiographs 05/07/2015. FINDINGS: Heart size at the upper limits of normal. Aortic atherosclerosis. Suspected small left pleural effusion with associated left basilar atelectasis and/or consolidation. No appreciable airspace consolidation within the right lung. No evidence of pneumothorax. No acute bony abnormality identified. Thoracolumbar dextrocurvature. IMPRESSION: Suspected small left pleural effusion with left basilar atelectasis and/or consolidation. Aortic Atherosclerosis (ICD10-I70.0). Electronically Signed   By: Kellie Simmering D.O.   On: 03/11/2021 16:14   DG Shoulder Left  Result Date: 02/24/2021 CLINICAL DATA:  Status post fall, shoulder pain EXAM: LEFT SHOULDER - 2+ VIEW COMPARISON:  None. FINDINGS: No acute fracture or dislocation. No aggressive osseous lesion. Normal alignment. Generalized osteopenia. Mild osteoarthritis of the glenohumeral joint and acromioclavicular joint. Soft tissue are unremarkable. No radiopaque foreign body or soft tissue emphysema. IMPRESSION: No acute osseous injury of the left shoulder. Electronically Signed   By: Kathreen Devoid M.D.   On:  02/24/2021 12:45   ECHOCARDIOGRAM COMPLETE  Result Date: 02/25/2021    ECHOCARDIOGRAM REPORT   Patient Name:   Cathy Silva [place] Date of Exam: 02/25/2021 Medical Rec #:  [id number]          Height:       66.0 in Accession #:    [id number]         Weight:       140.0 lb Date of Birth:  1929/07/17          BSA:          1.719 m Patient Age:    59 years           BP:           173/67 mmHg Patient Gender: F                  HR:           80 bpm. Exam Location:  Inpatient Procedure: 2D Echo, Color Doppler and Cardiac Doppler Indications:    R07.9* Chest pain, unspecified  History:        Patient has prior  history of Echocardiogram examinations, most                 recent 12/25/2020. Aortic Valve Disease; Risk                 Factors:Hypertension and Dyslipidemia.  Sonographer:    Glo Herring Referring Phys: Donovan  1. Left ventricular ejection fraction, by estimation, is 60 to 65%. The left ventricle has normal function. The left ventricle has no regional wall motion abnormalities. There is severe concentric left ventricular hypertrophy. Indeterminate diastolic filling due to E-A fusion.  2. Right ventricular systolic function is normal. The right ventricular size is normal. Tricuspid regurgitation signal is inadequate for assessing PA pressure.  3. The mitral valve is degenerative. No evidence of mitral valve regurgitation. No evidence of mitral stenosis.  4. The aortic valve is calcified. There is severe calcifcation of the aortic valve. There is severe thickening of the aortic valve. Aortic valve regurgitation is not visualized. Moderate aortic valve stenosis. Aortic valve mean gradient measures 21.0 mmHg. Aortic valve Vmax measures 3.18 m/s.  5. The inferior vena cava is normal in size with <50% respiratory variability, suggesting right atrial pressure of 8 mmHg. FINDINGS  Left Ventricle: Left ventricular ejection fraction, by estimation, is 60 to 65%. The left ventricle has normal function. The left ventricle has no regional wall motion abnormalities. The left ventricular internal cavity size was normal in size. There is  severe concentric left ventricular hypertrophy. Indeterminate diastolic filling due to E-A fusion. Right Ventricle: The right ventricular size is normal. No increase in right ventricular wall thickness. Right ventricular systolic function is normal. Tricuspid regurgitation signal is inadequate for assessing PA pressure. Left Atrium: Left atrial size was normal in size. Right Atrium: Right atrial size was normal in size. Pericardium: There is no evidence of  pericardial effusion. Mitral Valve: The mitral valve is degenerative in appearance. There is mild thickening of the mitral valve leaflet(s). There is moderate calcification of the anterior mitral valve leaflet(s). Mild to moderate mitral annular calcification. No evidence of mitral valve regurgitation. No evidence of mitral valve stenosis. Tricuspid Valve: The tricuspid valve is normal in structure. Tricuspid valve regurgitation is trivial. No evidence of tricuspid stenosis. Aortic Valve: The aortic valve is calcified. There is severe calcifcation of the aortic valve. There is severe thickening of  the aortic valve. Aortic valve regurgitation is not visualized. Moderate aortic stenosis is present. Aortic valve mean gradient measures 21.0 mmHg. Aortic valve peak gradient measures 40.4 mmHg. Pulmonic Valve: The pulmonic valve was normal in structure. Pulmonic valve regurgitation is trivial. No evidence of pulmonic stenosis. Aorta: The aortic root is normal in size and structure. Venous: The inferior vena cava is normal in size with less than 50% respiratory variability, suggesting right atrial pressure of 8 mmHg. IAS/Shunts: No atrial level shunt detected by color flow Doppler.  LEFT VENTRICLE PLAX 2D LVIDd:         2.90 cm LVIDs:         2.20 cm LV PW:         1.50 cm LV IVS:        1.50 cm  IVC IVC diam: 1.30 cm LEFT ATRIUM             Index LA diam:        3.20 cm 1.86 cm/m LA Vol (A2C):   35.3 ml 20.54 ml/m LA Vol (A4C):   40.0 ml 23.28 ml/m LA Biplane Vol: 38.6 ml 22.46 ml/m  AORTIC VALVE AV Vmax:           318.00 cm/s AV Vmean:          218.000 cm/s AV VTI:            0.538 m AV Peak Grad:      40.4 mmHg AV Mean Grad:      21.0 mmHg LVOT Vmax:         80.68 cm/s LVOT Vmean:        56.260 cm/s LVOT VTI:          0.158 m LVOT/AV VTI ratio: 0.29  AORTA Ao Root diam: 3.40 cm Ao Asc diam:  2.70 cm  SHUNTS Systemic VTI: 0.16 m Fransico Him MD Electronically signed by Fransico Him MD Signature Date/Time:  02/25/2021/1:57:48 PM    Final    DG Hip Unilat With Pelvis 2-3 Views Left  Result Date: 02/24/2021 CLINICAL DATA:  Status post fall, bilateral hip pain EXAM: DG HIP (WITH OR WITHOUT PELVIS) 2-3V RIGHT; LEFT FEMUR 2 VIEWS; RIGHT FEMUR 2 VIEWS; DG HIP (WITH OR WITHOUT PELVIS) 2-3V LEFT COMPARISON:  None. FINDINGS: No acute fracture or dislocation. No aggressive osseous lesion. Normal alignment. Generalized osteopenia. Mild osteoarthritis of the hips bilaterally. Moderate left medial femorotibial compartment osteoarthritis. Mild osteoarthritis of the right medial and lateral femorotibial compartments as well as the left lateral femorotibial compartment. Soft tissue are unremarkable. No radiopaque foreign body or soft tissue emphysema. Peripheral vascular atherosclerotic disease. IMPRESSION: 1.  No acute osseous injury of the right hip. 2.  No acute osseous injury of the left hip. 3.  No acute osseous injury of the right femur. 4.  No acute osseous injury of the left femur. 5. Given the patient's age and osteopenia, if there is persistent clinical concern for an occult hip fracture, a MRI of the hip is recommended for increased sensitivity. Electronically Signed   By: Kathreen Devoid M.D.   On: 02/24/2021 12:52   DG Hip Unilat With Pelvis 2-3 Views Right  Result Date: 02/24/2021 CLINICAL DATA:  Status post fall, bilateral hip pain EXAM: DG HIP (WITH OR WITHOUT PELVIS) 2-3V RIGHT; LEFT FEMUR 2 VIEWS; RIGHT FEMUR 2 VIEWS; DG HIP (WITH OR WITHOUT PELVIS) 2-3V LEFT COMPARISON:  None. FINDINGS: No acute fracture or dislocation. No aggressive osseous lesion. Normal alignment. Generalized osteopenia.  Mild osteoarthritis of the hips bilaterally. Moderate left medial femorotibial compartment osteoarthritis. Mild osteoarthritis of the right medial and lateral femorotibial compartments as well as the left lateral femorotibial compartment. Soft tissue are unremarkable. No radiopaque foreign body or soft tissue emphysema.  Peripheral vascular atherosclerotic disease. IMPRESSION: 1.  No acute osseous injury of the right hip. 2.  No acute osseous injury of the left hip. 3.  No acute osseous injury of the right femur. 4.  No acute osseous injury of the left femur. 5. Given the patient's age and osteopenia, if there is persistent clinical concern for an occult hip fracture, a MRI of the hip is recommended for increased sensitivity. Electronically Signed   By: Kathreen Devoid M.D.   On: 02/24/2021 12:52   DG FEMUR MIN 2 VIEWS LEFT  Result Date: 02/24/2021 CLINICAL DATA:  Status post fall, bilateral hip pain EXAM: DG HIP (WITH OR WITHOUT PELVIS) 2-3V RIGHT; LEFT FEMUR 2 VIEWS; RIGHT FEMUR 2 VIEWS; DG HIP (WITH OR WITHOUT PELVIS) 2-3V LEFT COMPARISON:  None. FINDINGS: No acute fracture or dislocation. No aggressive osseous lesion. Normal alignment. Generalized osteopenia. Mild osteoarthritis of the hips bilaterally. Moderate left medial femorotibial compartment osteoarthritis. Mild osteoarthritis of the right medial and lateral femorotibial compartments as well as the left lateral femorotibial compartment. Soft tissue are unremarkable. No radiopaque foreign body or soft tissue emphysema. Peripheral vascular atherosclerotic disease. IMPRESSION: 1.  No acute osseous injury of the right hip. 2.  No acute osseous injury of the left hip. 3.  No acute osseous injury of the right femur. 4.  No acute osseous injury of the left femur. 5. Given the patient's age and osteopenia, if there is persistent clinical concern for an occult hip fracture, a MRI of the hip is recommended for increased sensitivity. Electronically Signed   By: Kathreen Devoid M.D.   On: 02/24/2021 12:52   DG FEMUR, MIN 2 VIEWS RIGHT  Result Date: 02/24/2021 CLINICAL DATA:  Status post fall, bilateral hip pain EXAM: DG HIP (WITH OR WITHOUT PELVIS) 2-3V RIGHT; LEFT FEMUR 2 VIEWS; RIGHT FEMUR 2 VIEWS; DG HIP (WITH OR WITHOUT PELVIS) 2-3V LEFT COMPARISON:  None. FINDINGS: No acute  fracture or dislocation. No aggressive osseous lesion. Normal alignment. Generalized osteopenia. Mild osteoarthritis of the hips bilaterally. Moderate left medial femorotibial compartment osteoarthritis. Mild osteoarthritis of the right medial and lateral femorotibial compartments as well as the left lateral femorotibial compartment. Soft tissue are unremarkable. No radiopaque foreign body or soft tissue emphysema. Peripheral vascular atherosclerotic disease. IMPRESSION: 1.  No acute osseous injury of the right hip. 2.  No acute osseous injury of the left hip. 3.  No acute osseous injury of the right femur. 4.  No acute osseous injury of the left femur. 5. Given the patient's age and osteopenia, if there is persistent clinical concern for an occult hip fracture, a MRI of the hip is recommended for increased sensitivity. Electronically Signed   By: Kathreen Devoid M.D.   On: 02/24/2021 12:52   VAS US CAROTID  Result Date: 03/06/2021 Carotid Arterial Duplex Study Patient Name:  Cathy Silva  Date of Exam:   03/06/2021 Medical Rec #: [id number]           Accession #:    [id number] Date of Birth: 01-16-1930           Patient Gender: F Patient Age:   50 years Exam Location:  [facility] Procedure:      VAS US CAROTID Referring  Phys: SALMAN KHALIQDINA --------------------------------------------------------------------------------  Indications:       CVA. Risk Factors:      Hyperlipidemia. Comparison Study:  No prior study Performing Technologist: Maudry Mayhew MHA, RDMS, RVT, RDCS  Examination Guidelines: A complete evaluation includes B-mode imaging, spectral Doppler, color Doppler, and power Doppler as needed of all accessible portions of each vessel. Bilateral testing is considered an integral part of a complete examination. Limited examinations for reoccurring indications may be performed as noted.  Right Carotid Findings:  +---------+--------+-------+--------+---------------------------------+--------+          PSV cm/sEDV    StenosisPlaque Description               Comments                  cm/s                                                     +---------+--------+-------+--------+---------------------------------+--------+ CCA Prox 67      10                                                       +---------+--------+-------+--------+---------------------------------+--------+ CCA      76      9              smooth and heterogenous                   Distal                                                                    +---------+--------+-------+--------+---------------------------------+--------+ ICA Prox 183     34             heterogenous, irregular and                                               calcific                                  +---------+--------+-------+--------+---------------------------------+--------+ ICA      148     31                                                       Distal                                                                    +---------+--------+-------+--------+---------------------------------+--------+  ECA      70                     heterogenous and calcific                 +---------+--------+-------+--------+---------------------------------+--------+ +----------+--------+-------+----------------+-------------------+           PSV cm/sEDV cmsDescribe        Arm Pressure (mmHG) +----------+--------+-------+----------------+-------------------+ [id number]             Multiphasic, WNL                    +----------+--------+-------+----------------+-------------------+ +---------+--------+--+--------+--+---------+ VertebralPSV cm/s55EDV cm/s15Antegrade +---------+--------+--+--------+--+---------+  Left Carotid Findings:  +----------+--------+-------+--------+--------------------------------+--------+           PSV cm/sEDV    StenosisPlaque Description              Comments                   cm/s                                                    +----------+--------+-------+--------+--------------------------------+--------+ CCA Prox  81      8              smooth and heterogenous                  +----------+--------+-------+--------+--------------------------------+--------+ CCA [id number]     8              smooth and heterogenous                  +----------+--------+-------+--------+--------------------------------+--------+ ICA Prox  88      13             smooth, heterogenous and                                                  calcific                                 +----------+--------+-------+--------+--------------------------------+--------+ ICA Distal86      19                                                      +----------+--------+-------+--------+--------------------------------+--------+ ECA       135                                                             +----------+--------+-------+--------+--------------------------------+--------+ +----------+--------+--------+----------------+-------------------+           PSV cm/sEDV cm/sDescribe        Arm Pressure (mmHG) +----------+--------+--------+----------------+-------------------+ Subclavian180             Multiphasic, WNL                    +----------+--------+--------+----------------+-------------------+ +---------+--------+--+--------+--+---------+  VertebralPSV cm/s43EDV cm/s10Antegrade +---------+--------+--+--------+--+---------+   Summary: Right Carotid: Velocities in the right ICA are consistent with an upper range                1-39% stenosis. Left Carotid: Velocities in the left ICA are consistent with a 1-39% stenosis. Vertebrals:  Bilateral vertebral arteries demonstrate  antegrade flow. Subclavians: Normal flow hemodynamics were seen in bilateral subclavian              arteries. *See table(s) above for measurements and observations.  Electronically signed by Antony Contras MD on 03/06/2021 at 5:53:07 PM.    Final       Today   Subjective    Cathy Silva today has no headache,no chest abdominal pain,no new weakness tingling or numbness, feels much better     Objective   Blood pressure (!) 114/103, pulse 94, temperature (!) 97.4 F (36.3 C), temperature source Oral, resp. rate 18, last menstrual period 05/31/1987, SpO2 95 %.   Intake/Output Summary (Last 24 hours) at 03/13/2021 1014 Last data filed at 03/13/2021 0600 Gross per 24 hour  Intake 360 ml  Output 1200 ml  Net -840 ml    Exam  Awake Alert x 2, dense right-sided hemiparesis, baseline hard of hearing, mild expressive aphasia. [place].AT,PERRAL Supple Neck,No JVD, No cervical lymphadenopathy appriciated.  Symmetrical Chest wall movement, Good air movement bilaterally, CTAB RRR,No Gallops,Rubs or new Murmurs, No Parasternal Heave +ve B.Sounds, Abd Soft, Non tender, No organomegaly appriciated, No rebound -guarding or rigidity. No Cyanosis, Clubbing or edema, No new Rash or bruise   Data Review   CBC w Diff:  Lab Results  Component Value Date   WBC 9.2 03/13/2021   HGB 9.7 (L) 03/13/2021   HGB 13.7 02/19/2016   HCT 30.2 (L) 03/13/2021   HCT 39.9 02/19/2016   PLT 435 (H) 03/13/2021   PLT 400 02/19/2016   LYMPHOPCT 18 03/13/2021   LYMPHOPCT 22.5 02/19/2016   MONOPCT 13 03/13/2021   MONOPCT 9.1 02/19/2016   EOSPCT 4 03/13/2021   EOSPCT 1.0 02/19/2016   BASOPCT 1 03/13/2021   BASOPCT 0.7 02/19/2016    CMP:  Lab Results  Component Value Date   NA 134 (L) 03/13/2021   NA 132 (L) 02/19/2016   K 4.2 03/13/2021   K 4.4 02/19/2016   CL 103 03/13/2021   CO2 25 03/13/2021   CO2 24 02/19/2016   BUN 13 03/13/2021   BUN 15.3 02/19/2016   CREATININE 0.56 03/13/2021   CREATININE  0.8 02/19/2016   PROT 5.1 (L) 03/13/2021   PROT 7.6 02/19/2016   PROT 7.3 02/19/2016   ALBUMIN 2.1 (L) 03/13/2021   ALBUMIN 3.8 02/19/2016   BILITOT 0.5 03/13/2021   BILITOT 0.32 02/19/2016   ALKPHOS 49 03/13/2021   ALKPHOS 69 02/19/2016   AST 24 03/13/2021   AST 18 02/19/2016   ALT 25 03/13/2021   ALT 13 02/19/2016  . Lab Results  Component Value Date   HGBA1C 5.6 03/06/2021   Lab Results  Component Value Date   CHOL 199 03/06/2021   HDL 46 03/06/2021   LDLCALC 141 (H) 03/06/2021   TRIG 62 03/06/2021   CHOLHDL 4.3 03/06/2021     Total Time in preparing paper work, data evaluation and todays exam - 34 minutes  Lala Lund M.D on 03/13/2021 at 10:14 AM  [facility]

## 2021-03-13 NOTE — Discharge Instructions (Addendum)
Follow with Primary MD Koirala, Dibas, MD in 7 days   Get CBC, CMP, 2 view Chest X ray -  checked next visit within 1 week by Primary MD or SNF MD   Activity: As tolerated with Full fall precautions use walker/cane & assistance as needed  Disposition SNF  Diet: Heart Healthy   Special Instructions: If you have smoked or chewed Tobacco  in the last 2 yrs please stop smoking, stop any regular Alcohol  and or any Recreational drug use.  On your next visit with your primary care physician please Get Medicines reviewed and adjusted.  Please request your Prim.MD to go over all Hospital Tests and Procedure/Radiological results at the follow up, please get all Hospital records sent to your Prim MD by signing hospital release before you go home.  If you experience worsening of your admission symptoms, develop shortness of breath, life threatening emergency, suicidal or homicidal thoughts you must seek medical attention immediately by calling 911 or calling your MD immediately  if symptoms less severe.  You Must read complete instructions/literature along with all the possible adverse reactions/side effects for all the Medicines you take and that have been prescribed to you. Take any new Medicines after you have completely understood and accpet all the possible adverse reactions/side effects.

## 2021-03-13 NOTE — Plan of Care (Signed)
Problem: Acute Rehab PT Goals(only PT should resolve) Goal: Pt Will Go Supine/Side To Sit Outcome: Adequate for Discharge Goal: Pt Will Go Sit To Supine/Side Outcome: Adequate for Discharge Goal: Patient Will Transfer Sit To/From Stand Outcome: Adequate for Discharge Goal: Pt Will Transfer Bed To Chair/Chair To Bed Outcome: Adequate for Discharge Goal: Pt Will Ambulate Outcome: Adequate for Discharge   Problem: Ischemic Stroke/TIA Tissue Perfusion: Goal: Complications of ischemic stroke/TIA will be minimized Outcome: Adequate for Discharge   Problem: SLP Language Goals Goal: Patient will utilize speech intelligibility Description: Patient will utilize speech intelligibility strategies to  enhance communication with Outcome: Adequate for Discharge Goal: Patient will communicate needs/wants with Outcome: Adequate for Discharge   Problem: Education: Goal: Knowledge of General Education information will improve Description: Including pain rating scale, medication(s)/side effects and non-pharmacologic comfort measures Outcome: Adequate for Discharge   Problem: Health Behavior/Discharge Planning: Goal: Ability to manage health-related needs will improve Outcome: Adequate for Discharge   Problem: Clinical Measurements: Goal: Ability to maintain clinical measurements within normal limits will improve Outcome: Adequate for Discharge Goal: Will remain free from infection Outcome: Adequate for Discharge Goal: Diagnostic test results will improve Outcome: Adequate for Discharge Goal: Respiratory complications will improve Outcome: Adequate for Discharge Goal: Cardiovascular complication will be avoided Outcome: Adequate for Discharge   Problem: Activity: Goal: Risk for activity intolerance will decrease Outcome: Adequate for Discharge   Problem: Nutrition: Goal: Adequate nutrition will be maintained Outcome: Adequate for Discharge   Problem: Coping: Goal: Level of anxiety  will decrease Outcome: Adequate for Discharge   Problem: Elimination: Goal: Will not experience complications related to bowel motility Outcome: Adequate for Discharge Goal: Will not experience complications related to urinary retention Outcome: Adequate for Discharge   Problem: Pain Managment: Goal: General experience of comfort will improve Outcome: Adequate for Discharge   Problem: Safety: Goal: Ability to remain free from injury will improve Outcome: Adequate for Discharge   Problem: Skin Integrity: Goal: Risk for impaired skin integrity will decrease Outcome: Adequate for Discharge   Problem: Acute Rehab OT Goals (only OT should resolve) Goal: Pt. Will Perform Eating Outcome: Adequate for Discharge Goal: Pt. Will Perform Grooming Outcome: Adequate for Discharge Goal: Pt. Will Perform Upper Body Bathing Outcome: Adequate for Discharge Goal: Pt. Will Perform Lower Body Bathing Outcome: Adequate for Discharge Goal: Pt. Will Perform Upper Body Dressing Outcome: Adequate for Discharge Goal: Pt. Will Perform Lower Body Dressing Outcome: Adequate for Discharge Goal: Pt. Will Transfer To Toilet Outcome: Adequate for Discharge Goal: Pt. Will Perform Toileting-Clothing Manipulation Outcome: Adequate for Discharge   Problem: Education: Goal: Knowledge of disease or condition will improve Outcome: Adequate for Discharge Goal: Knowledge of secondary prevention will improve (SELECT ALL) Outcome: Adequate for Discharge Goal: Knowledge of patient specific risk factors will improve (INDIVIDUALIZE FOR PATIENT) Outcome: Adequate for Discharge Goal: Individualized Educational Video(s) Outcome: Adequate for Discharge   Problem: Coping: Goal: Will verbalize positive feelings about self Outcome: Adequate for Discharge Goal: Will identify appropriate support needs Outcome: Adequate for Discharge   Problem: Health Behavior/Discharge Planning: Goal: Ability to manage health-related  needs will improve Outcome: Adequate for Discharge   Problem: Self-Care: Goal: Ability to participate in self-care as condition permits will improve Outcome: Adequate for Discharge Goal: Verbalization of feelings and concerns over difficulty with self-care will improve Outcome: Adequate for Discharge Goal: Ability to communicate needs accurately will improve Outcome: Adequate for Discharge   Problem: Nutrition: Goal: Risk of aspiration will decrease Outcome: Adequate for Discharge Goal: Dietary  intake will improve Outcome: Adequate for Discharge   Problem: Ischemic Stroke/TIA Tissue Perfusion: Goal: Complications of ischemic stroke/TIA will be minimized Outcome: Adequate for Discharge

## 2021-03-14 DIAGNOSIS — L89616 Pressure-induced deep tissue damage of right heel: Secondary | ICD-10-CM | POA: Diagnosis not present

## 2021-03-14 DIAGNOSIS — L988 Other specified disorders of the skin and subcutaneous tissue: Secondary | ICD-10-CM | POA: Diagnosis not present

## 2021-03-14 DIAGNOSIS — L89622 Pressure ulcer of left heel, stage 2: Secondary | ICD-10-CM | POA: Diagnosis not present

## 2021-03-15 DIAGNOSIS — I10 Essential (primary) hypertension: Secondary | ICD-10-CM | POA: Diagnosis not present

## 2021-03-15 DIAGNOSIS — I69351 Hemiplegia and hemiparesis following cerebral infarction affecting right dominant side: Secondary | ICD-10-CM | POA: Diagnosis not present

## 2021-03-15 DIAGNOSIS — I69391 Dysphagia following cerebral infarction: Secondary | ICD-10-CM | POA: Diagnosis not present

## 2021-03-15 DIAGNOSIS — M150 Primary generalized (osteo)arthritis: Secondary | ICD-10-CM | POA: Diagnosis not present

## 2021-03-15 DIAGNOSIS — D649 Anemia, unspecified: Secondary | ICD-10-CM | POA: Diagnosis not present

## 2021-03-18 DIAGNOSIS — E441 Mild protein-calorie malnutrition: Secondary | ICD-10-CM | POA: Diagnosis not present

## 2021-03-18 DIAGNOSIS — N319 Neuromuscular dysfunction of bladder, unspecified: Secondary | ICD-10-CM | POA: Diagnosis not present

## 2021-03-18 DIAGNOSIS — I69351 Hemiplegia and hemiparesis following cerebral infarction affecting right dominant side: Secondary | ICD-10-CM | POA: Diagnosis not present

## 2021-03-18 DIAGNOSIS — F4323 Adjustment disorder with mixed anxiety and depressed mood: Secondary | ICD-10-CM | POA: Diagnosis not present

## 2021-03-18 DIAGNOSIS — E785 Hyperlipidemia, unspecified: Secondary | ICD-10-CM | POA: Diagnosis not present

## 2021-03-18 DIAGNOSIS — R1310 Dysphagia, unspecified: Secondary | ICD-10-CM | POA: Diagnosis not present

## 2021-03-20 DIAGNOSIS — R54 Age-related physical debility: Secondary | ICD-10-CM | POA: Diagnosis not present

## 2021-03-20 DIAGNOSIS — I69391 Dysphagia following cerebral infarction: Secondary | ICD-10-CM | POA: Diagnosis not present

## 2021-03-20 DIAGNOSIS — D649 Anemia, unspecified: Secondary | ICD-10-CM | POA: Diagnosis not present

## 2021-03-20 DIAGNOSIS — R338 Other retention of urine: Secondary | ICD-10-CM | POA: Diagnosis not present

## 2021-03-20 DIAGNOSIS — E785 Hyperlipidemia, unspecified: Secondary | ICD-10-CM | POA: Diagnosis not present

## 2021-03-20 DIAGNOSIS — F3289 Other specified depressive episodes: Secondary | ICD-10-CM | POA: Diagnosis not present

## 2021-03-20 DIAGNOSIS — I10 Essential (primary) hypertension: Secondary | ICD-10-CM | POA: Diagnosis not present

## 2021-03-20 DIAGNOSIS — M150 Primary generalized (osteo)arthritis: Secondary | ICD-10-CM | POA: Diagnosis not present

## 2021-03-20 DIAGNOSIS — I69351 Hemiplegia and hemiparesis following cerebral infarction affecting right dominant side: Secondary | ICD-10-CM | POA: Diagnosis not present

## 2021-03-21 ENCOUNTER — Telehealth (HOSPITAL_COMMUNITY): Payer: Self-pay

## 2021-03-21 ENCOUNTER — Encounter: Payer: Self-pay | Admitting: Physician Assistant

## 2021-03-21 ENCOUNTER — Ambulatory Visit (INDEPENDENT_AMBULATORY_CARE_PROVIDER_SITE_OTHER): Payer: Medicare Other | Admitting: Physician Assistant

## 2021-03-21 ENCOUNTER — Other Ambulatory Visit: Payer: Self-pay

## 2021-03-21 VITALS — BP 118/60 | HR 89 | Ht 66.0 in

## 2021-03-21 DIAGNOSIS — E782 Mixed hyperlipidemia: Secondary | ICD-10-CM

## 2021-03-21 DIAGNOSIS — J918 Pleural effusion in other conditions classified elsewhere: Secondary | ICD-10-CM | POA: Diagnosis not present

## 2021-03-21 DIAGNOSIS — R778 Other specified abnormalities of plasma proteins: Secondary | ICD-10-CM | POA: Diagnosis not present

## 2021-03-21 DIAGNOSIS — L988 Other specified disorders of the skin and subcutaneous tissue: Secondary | ICD-10-CM | POA: Diagnosis not present

## 2021-03-21 DIAGNOSIS — I10 Essential (primary) hypertension: Secondary | ICD-10-CM

## 2021-03-21 DIAGNOSIS — I350 Nonrheumatic aortic (valve) stenosis: Secondary | ICD-10-CM

## 2021-03-21 DIAGNOSIS — Z8673 Personal history of transient ischemic attack (TIA), and cerebral infarction without residual deficits: Secondary | ICD-10-CM

## 2021-03-21 DIAGNOSIS — L89622 Pressure ulcer of left heel, stage 2: Secondary | ICD-10-CM | POA: Diagnosis not present

## 2021-03-21 DIAGNOSIS — I639 Cerebral infarction, unspecified: Secondary | ICD-10-CM | POA: Diagnosis not present

## 2021-03-21 DIAGNOSIS — L89616 Pressure-induced deep tissue damage of right heel: Secondary | ICD-10-CM | POA: Diagnosis not present

## 2021-03-21 DIAGNOSIS — R7989 Other specified abnormal findings of blood chemistry: Secondary | ICD-10-CM

## 2021-03-21 NOTE — Assessment & Plan Note (Signed)
She has had evidence of paradoxical low-flow low gradient aortic stenosis.  On most recent echocardiograms, her mean gradients have remained fairly stable.  Her dimensionless index has also remained fairly stable.  She is currently not a candidate for intervention due to her need to recover from her recent stroke.  I will make sure she has follow-up with Dr. Gasper Sells in 3 months.

## 2021-03-21 NOTE — Assessment & Plan Note (Addendum)
Blood pressure is well controlled.  She is not currently on any medication.

## 2021-03-21 NOTE — Assessment & Plan Note (Addendum)
She was recently admitted with left MCA branch infarct as well as right periventricular white matter subcortical infarct.  Neurology notes indicate etiology likely was cardioembolic.  Patient was not started on anticoagulation.  I have reviewed her notes from the hospital.  Neurology was concerned about fall risk and bleeding.  They also did not recommend proceeding with implantable loop recorder.  They did note that event monitor could be considered at discharge.  I reviewed her case today with Dr. Rayann Heman with the EP.  Given her current state, he agrees that proceeding with implantable loop recorder should be avoided at this time.  Proceeding with an event monitor would be reasonable.  I reviewed this with the patient and her niece.  Her niece will discuss with other family members and decide if they want to pursue wearing a monitor.  We discussed the indication for doing this and the implications of identifying atrial fibrillation.

## 2021-03-21 NOTE — Telephone Encounter (Signed)
Attempted to contact patient to schedule OP MBS - left voicemail. ?

## 2021-03-21 NOTE — Assessment & Plan Note (Addendum)
She was admitted in October with rhabdomyolysis.  She had fallen and was down for an unknown amount of time.  She had elevated CK levels as well as troponins.  She was evaluated by cardiology in the hospital.  Her EF remained normal without wall motion abnormality on echocardiogram.  The overall picture did not seem to be consistent with ACS.  No further cardiac testing was recommended at that time.  She does have a history of coronary calcifications on CT.  However, she is currently not having chest pain.  As noted, she is currently recovering from her stroke.  She is not a candidate for stress testing at this time.  As she recovers, if she develops anginal symptoms, we can consider stress testing at that time.

## 2021-03-21 NOTE — Patient Instructions (Signed)
Medication Instructions:   Your physician recommends that you continue on your current medications as directed. Please refer to the Current Medication list given to you today.  *If you need a refill on your cardiac medications before your next appointment, please call your pharmacy*   Lab Work:  -NONE  If you have labs (blood work) drawn today and your tests are completely normal, you will receive your results only by: [place] (if you have MyChart) OR A paper copy in the mail If you have any lab test that is abnormal or we need to change your treatment, we will call you to review the results.   Testing/Procedures:  -NONE   Follow-Up: At [facility], you and your health needs are our priority.  As part of our continuing mission to provide you with exceptional heart care, we have created designated Provider Care Teams.  These Care Teams include your primary Cardiologist (physician) and Advanced Practice Providers (APPs -  Physician Assistants and Nurse Practitioners) who all work together to provide you with the care you need, when you need it.  We recommend signing up for the patient portal called "MyChart".  Sign up information is provided on this After Visit Summary.  MyChart is used to connect with patients for Virtual Visits (Telemedicine).  Patients are able to view lab/test results, encounter notes, upcoming appointments, etc.  Non-urgent messages can be sent to your provider as well.   To learn more about what you can do with MyChart, go to [email or web address].    Your next appointment:   3 month(s)  The format for your next appointment:   In Person  Provider:   Werner Lean, MD     Other Instructions  -None

## 2021-03-21 NOTE — Assessment & Plan Note (Signed)
Continue rosuvastatin 20 mg daily. 

## 2021-03-21 NOTE — Progress Notes (Signed)
Cardiology Office Note:    Date:  03/21/2021   ID:  JKAYLA SPIEWAK, DOB [birth year], MRN [id number]  PCP:  [name], MD   [facility] Cardiologist:  Werner Lean, MD    Referring MD: [name], MD   Chief Complaint:  Hospitalization Follow-up (1. Fall w assoc rhabdomyolysis; 2. S/p stroke)    Patient Profile:   Cathy Silva is a 87 y.o. female with:  Mod to severe aortic stenosis  Paradoxical LFLF AS Hypertension  Hyperlipidemia  Aortic atherosclerosis  Coronary artery Ca2+ S/p CVA 02/2021 R sided hemiparesis  MVP  Hx of hypoNa2+ Right Bundle Branch Block   History of Present Illness: Ms. Brossart was evaluated by Dr. Gasper Sells in 2/22 for aortic stenosis.  She was felt to have paradoxical LFLG mod to severe AS.  She was hesitant to pursue more invasive testing and it was decided to proceed with f/u echocardiogram in 6 mos. for follow-up echocardiogram in August 2022 demonstrated similar findings.  She declined follow-up and requested to follow-up with her PCP only.       She was admitted 10/29-11/3 with rhabdomyolysis.  She had fallen at home and could not get up for an unknown duration.  Her troponins were significantly elevated.  EF remained normal on echocardiogram.  She was evaluated by cardiology.  Her elevated troponin was not felt to be consistent with ACS and was likely related to rhabdomyolysis.  No further evaluation was recommended.  She was discharged to SNF.  She was admitted 11/7-11/15 with acute left MCA branch CVA as well as right periventricular white matter subcortical infarct likely cardioembolic in origin.  Notes reviewed from neurology.  There was some concern about frailty at home and risk for bleeding with falls.  Decision was made to not pursue loop recorder.  30-day event monitor could be considered at discharge.  It does not appear that an event monitor was arranged and this was not mentioned in the discharge  notes.  She returns for cardiology follow-up.  She is currently at [facility].  She is here today with her niece.  The patient is basically bed bound.  She needs help sitting up.  Her R arm is flaccid.  She is not walking independently.  She is not having chest pain, shortness of breath, syncope, orthopnea, edema.    ASSESSMENT & PLAN:   History of stroke She was recently admitted with left MCA branch infarct as well as right periventricular white matter subcortical infarct.  Neurology notes indicate etiology likely was cardioembolic.  Patient was not started on anticoagulation.  I have reviewed her notes from the hospital.  Neurology was concerned about fall risk and bleeding.  They also did not recommend proceeding with implantable loop recorder.  They did note that event monitor could be considered at discharge.  I reviewed her case today with Dr. Rayann Heman with the EP.  Given her current state, he agrees that proceeding with implantable loop recorder should be avoided at this time.  Proceeding with an event monitor would be reasonable.  I reviewed this with the patient and her niece.  Her niece will discuss with other family members and decide if they want to pursue wearing a monitor.  We discussed the indication for doing this and the implications of identifying atrial fibrillation.   Nonrheumatic aortic valve stenosis She has had evidence of paradoxical low-flow low gradient aortic stenosis.  On most recent echocardiograms, her mean gradients have remained fairly stable.  Her dimensionless index has also remained fairly stable.  She is currently not a candidate for intervention due to her need to recover from her recent stroke.  I will make sure she has follow-up with Dr. Gasper Sells in 3 months.  Essential hypertension Blood pressure is well controlled.  She is not currently on any medication.  Hyperlipidemia Continue rosuvastatin 20 mg daily.  Elevated troponin She was admitted in October  with rhabdomyolysis.  She had fallen and was down for an unknown amount of time.  She had elevated CK levels as well as troponins.  She was evaluated by cardiology in the hospital.  Her EF remained normal without wall motion abnormality on echocardiogram.  The overall picture did not seem to be consistent with ACS.  No further cardiac testing was recommended at that time.  She does have a history of coronary calcifications on CT.  However, she is currently not having chest pain.  As noted, she is currently recovering from her stroke.  She is not a candidate for stress testing at this time.  As she recovers, if she develops anginal symptoms, we can consider stress testing at that time.            Dispo:  Return in about 3 months (around 06/21/2021) for Routine follow up in 3 months with Dr. Westly Pam. .    Prior CV studies: Carotid US 03/06/2021 Bilateral ICA 1-39  Echocardiogram 02/25/2021 EF 60-65, no RWMA, severe concentric LVH, normal RVSF, moderate aortic stenosis (mean gradient 21 mmHg, V-max 318 cm/s, DI 0.29)  Echocardiogram 12/25/2020 EF 55-60, no RWMA, moderate asymmetric LVH, GR 1 DD, normal RVSF, trivial MR, moderate aortic stenosis (mean gradient 28.5 mmHg, V-max 341 cm/s, DI 0.25     Past Medical History:  Diagnosis Date   Allergies    BCC (basal cell carcinoma), arm 2008   Left    Elevated cholesterol    Estrogen deficiency    Fracture    in back   Hx of scarlet fever    Hypercholesteremia    Hyponatremia    Insomnia    Low sodium levels    Melanoma in situ ([place]) 2009   Mitral valve prolapse    Murmur    Psychosocial problem    Thrombocytosis    Current Medications: Current Meds  Medication Sig   Ascorbic Acid (VITAMIN C) 1000 MG tablet Take 1,000 mg by mouth daily.   aspirin EC 81 MG EC tablet Take 1 tablet (81 mg total) by mouth daily. Swallow whole.   Calcium Carbonate-Vitamin D (CALCIUM + D PO) Take 1,200 mg by mouth.   Doxycycline Hyclate 50 MG TABS  Take 100 mg by mouth 2 (two) times daily. Pt instructed to take this medication for only ten days for her arm   escitalopram (LEXAPRO) 5 MG tablet Take 5 mg by mouth daily.   feeding supplement (BOOST / RESOURCE BREEZE) LIQD Take 1 Container by mouth 3 (three) times daily between meals. (Patient taking differently: Take 1 Container by mouth daily.)   Multiple Vitamins-Minerals (MULTIVITAMIN ADULT) CHEW Chew 1 tablet by mouth daily.   Multiple Vitamins-Minerals (MULTIVITAMIN PO) Take 1 tablet by mouth daily with breakfast.   Omega-3 Fatty Acids (FISH OIL) 600 MG CAPS Take 600 mg by mouth in the morning and at bedtime.   rosuvastatin (CRESTOR) 20 MG tablet Take 1 tablet (20 mg total) by mouth daily.   tamsulosin (FLOMAX) 0.4 MG CAPS capsule Take 1 capsule (0.4 mg total) by mouth daily.  Allergies:   Novocain [procaine], Codeine, Pneumococcal vaccines, Pseudoephedrine, Tetanus toxoids, and Tetanus-diphtheria toxoids td   Social History   Tobacco Use   Smoking status: Never   Smokeless tobacco: Never  Substance Use Topics   Alcohol use: No    Alcohol/week: 0.0 standard drinks   Drug use: No    Family Hx: The patient's family history includes CAD in her mother; Heart attack in her maternal grandmother; Hemangiomas in her brother; Tuberculosis in her father.  Review of Systems  Respiratory:  Negative for cough.   Gastrointestinal:  Negative for hematochezia.  Genitourinary:  Negative for hematuria.    EKGs/Labs/Other Test Reviewed:    EKG:  EKG is  ordered today.  The ekg ordered today demonstrates NSR, HR 89, normal axis, right bundle branch block, QTC 484, no change from prior tracing  Recent Labs: 03/05/2021: TSH 2.867 03/13/2021: ALT 25; B Natriuretic Peptide 265.9; BUN 13; Creatinine, Ser 0.56; Hemoglobin 9.7; Magnesium 2.0; Platelets 435; Potassium 4.2; Sodium 134   Recent Lipid Panel Lab Results  Component Value Date/Time   CHOL 199 03/06/2021 02:58 AM   TRIG 62 03/06/2021  02:58 AM   HDL 46 03/06/2021 02:58 AM   LDLCALC 141 (H) 03/06/2021 02:58 AM     Risk Assessment/Calculations:          Physical Exam:    VS:  BP 118/60   Pulse 89   Ht 5\' 6"  (1.676 m)   LMP 05/31/1987   SpO2 97%   BMI 24.47 kg/m     Wt Readings from Last 3 Encounters:  03/05/21 151 lb 9.6 oz (68.8 kg)  03/01/21 151 lb 9.6 oz (68.8 kg)  02/24/21 140 lb (63.5 kg)    Constitutional:      Appearance: Not in distress.  Pulmonary:     Effort: Pulmonary effort is normal.     Breath sounds: No wheezing. No rales.  Cardiovascular:     Normal rate. Regular rhythm. Normal S1. Normal S2.      Murmurs: There is a grade 3/6 systolic murmur at the URSB.  Edema:    Peripheral edema absent.  Abdominal:     Palpations: Abdomen is soft.  Musculoskeletal:     Cervical back: Neck supple. Skin:    General: Skin is warm and dry.  Neurological:     Mental Status: Alert.     Cranial Nerves: No facial asymmetry.     Motor: Weakness (R sided hemiparesis noted) present.       Medication Adjustments/Labs and Tests Ordered: Current medicines are reviewed at length with the patient today.  Concerns regarding medicines are outlined above.  Tests Ordered: Orders Placed This Encounter  Procedures   EKG 12-Lead    Medication Changes: No orders of the defined types were placed in this encounter.  Signed, Richardson Dopp, PA-C  03/21/2021 5:23 PM    [facility] [place], [place], [place]  [id number] Phone: [phone]; Fax: [phone]

## 2021-03-23 DIAGNOSIS — R54 Age-related physical debility: Secondary | ICD-10-CM | POA: Diagnosis not present

## 2021-03-23 DIAGNOSIS — R338 Other retention of urine: Secondary | ICD-10-CM | POA: Diagnosis not present

## 2021-03-23 DIAGNOSIS — I10 Essential (primary) hypertension: Secondary | ICD-10-CM | POA: Diagnosis not present

## 2021-03-23 DIAGNOSIS — E785 Hyperlipidemia, unspecified: Secondary | ICD-10-CM | POA: Diagnosis not present

## 2021-03-23 DIAGNOSIS — I69391 Dysphagia following cerebral infarction: Secondary | ICD-10-CM | POA: Diagnosis not present

## 2021-03-23 DIAGNOSIS — M150 Primary generalized (osteo)arthritis: Secondary | ICD-10-CM | POA: Diagnosis not present

## 2021-03-23 DIAGNOSIS — F3289 Other specified depressive episodes: Secondary | ICD-10-CM | POA: Diagnosis not present

## 2021-03-23 DIAGNOSIS — Z960 Presence of urogenital implants: Secondary | ICD-10-CM | POA: Diagnosis not present

## 2021-03-23 DIAGNOSIS — D649 Anemia, unspecified: Secondary | ICD-10-CM | POA: Diagnosis not present

## 2021-03-23 DIAGNOSIS — I69351 Hemiplegia and hemiparesis following cerebral infarction affecting right dominant side: Secondary | ICD-10-CM | POA: Diagnosis not present

## 2021-03-27 ENCOUNTER — Other Ambulatory Visit (HOSPITAL_COMMUNITY): Payer: Self-pay

## 2021-03-27 ENCOUNTER — Telehealth: Payer: Self-pay | Admitting: Physician Assistant

## 2021-03-27 DIAGNOSIS — R059 Cough, unspecified: Secondary | ICD-10-CM

## 2021-03-27 DIAGNOSIS — R338 Other retention of urine: Secondary | ICD-10-CM | POA: Diagnosis not present

## 2021-03-27 DIAGNOSIS — M150 Primary generalized (osteo)arthritis: Secondary | ICD-10-CM | POA: Diagnosis not present

## 2021-03-27 DIAGNOSIS — I639 Cerebral infarction, unspecified: Secondary | ICD-10-CM

## 2021-03-27 DIAGNOSIS — D649 Anemia, unspecified: Secondary | ICD-10-CM | POA: Diagnosis not present

## 2021-03-27 DIAGNOSIS — Z960 Presence of urogenital implants: Secondary | ICD-10-CM | POA: Diagnosis not present

## 2021-03-27 DIAGNOSIS — E785 Hyperlipidemia, unspecified: Secondary | ICD-10-CM | POA: Diagnosis not present

## 2021-03-27 DIAGNOSIS — L0390 Cellulitis, unspecified: Secondary | ICD-10-CM | POA: Diagnosis not present

## 2021-03-27 DIAGNOSIS — R1310 Dysphagia, unspecified: Secondary | ICD-10-CM

## 2021-03-27 DIAGNOSIS — F3289 Other specified depressive episodes: Secondary | ICD-10-CM | POA: Diagnosis not present

## 2021-03-27 DIAGNOSIS — I69351 Hemiplegia and hemiparesis following cerebral infarction affecting right dominant side: Secondary | ICD-10-CM | POA: Diagnosis not present

## 2021-03-27 DIAGNOSIS — I10 Essential (primary) hypertension: Secondary | ICD-10-CM | POA: Diagnosis not present

## 2021-03-27 DIAGNOSIS — R54 Age-related physical debility: Secondary | ICD-10-CM | POA: Diagnosis not present

## 2021-03-27 DIAGNOSIS — I69391 Dysphagia following cerebral infarction: Secondary | ICD-10-CM | POA: Diagnosis not present

## 2021-03-27 NOTE — Telephone Encounter (Signed)
Cathy Silva is calling stating she spoke with the family and they are in agreement with the heart monitor Cathy Silva discussed with them at their last appt. She is requesting the order be placed and sent to [facility] in [place] where [place] is located.

## 2021-03-27 NOTE — Telephone Encounter (Signed)
Yes.  Please arrange 30 day event monitor for dx of stroke. Thanks! Richardson Dopp, PA-C    03/27/2021 9:33 PM

## 2021-03-28 ENCOUNTER — Encounter: Payer: Self-pay | Admitting: Radiology

## 2021-03-28 DIAGNOSIS — L988 Other specified disorders of the skin and subcutaneous tissue: Secondary | ICD-10-CM | POA: Diagnosis not present

## 2021-03-28 DIAGNOSIS — L89616 Pressure-induced deep tissue damage of right heel: Secondary | ICD-10-CM | POA: Diagnosis not present

## 2021-03-28 DIAGNOSIS — L89622 Pressure ulcer of left heel, stage 2: Secondary | ICD-10-CM | POA: Diagnosis not present

## 2021-03-28 NOTE — Telephone Encounter (Signed)
Order placed for 30 day cardiac event monitor as requested.

## 2021-03-28 NOTE — Progress Notes (Signed)
Enrolled Patient for a 30 day [employer] Event monitor to be mailed to patients address.

## 2021-03-29 DIAGNOSIS — I69391 Dysphagia following cerebral infarction: Secondary | ICD-10-CM | POA: Diagnosis not present

## 2021-03-29 DIAGNOSIS — L0390 Cellulitis, unspecified: Secondary | ICD-10-CM | POA: Diagnosis not present

## 2021-03-29 DIAGNOSIS — F3289 Other specified depressive episodes: Secondary | ICD-10-CM | POA: Diagnosis not present

## 2021-03-29 DIAGNOSIS — R338 Other retention of urine: Secondary | ICD-10-CM | POA: Diagnosis not present

## 2021-03-29 DIAGNOSIS — D649 Anemia, unspecified: Secondary | ICD-10-CM | POA: Diagnosis not present

## 2021-03-29 DIAGNOSIS — I69351 Hemiplegia and hemiparesis following cerebral infarction affecting right dominant side: Secondary | ICD-10-CM | POA: Diagnosis not present

## 2021-03-29 DIAGNOSIS — M150 Primary generalized (osteo)arthritis: Secondary | ICD-10-CM | POA: Diagnosis not present

## 2021-03-29 DIAGNOSIS — R54 Age-related physical debility: Secondary | ICD-10-CM | POA: Diagnosis not present

## 2021-03-29 DIAGNOSIS — I10 Essential (primary) hypertension: Secondary | ICD-10-CM | POA: Diagnosis not present

## 2021-03-29 DIAGNOSIS — E785 Hyperlipidemia, unspecified: Secondary | ICD-10-CM | POA: Diagnosis not present

## 2021-03-30 ENCOUNTER — Other Ambulatory Visit: Payer: Self-pay

## 2021-03-30 ENCOUNTER — Non-Acute Institutional Stay: Payer: Medicare Other | Admitting: Hospice

## 2021-03-30 DIAGNOSIS — Z515 Encounter for palliative care: Secondary | ICD-10-CM | POA: Diagnosis not present

## 2021-03-30 DIAGNOSIS — F339 Major depressive disorder, recurrent, unspecified: Secondary | ICD-10-CM

## 2021-03-30 DIAGNOSIS — I69351 Hemiplegia and hemiparesis following cerebral infarction affecting right dominant side: Secondary | ICD-10-CM

## 2021-03-30 NOTE — Progress Notes (Signed)
[facility] Consult Note Telephone: [phone]  Fax: [phone]  PATIENT NAME: Cathy Silva [address] [address] [phone] (home)  DOB: 09/05/1929 MRN: [id number]  PRIMARY CARE PROVIDER:    [name], MD,  [address] [address] [phone]  REFERRING PROVIDER:   Dr. Leanna Battles  RESPONSIBLE PARTY:   Maura Crandall Information     Name Relation Home Work Mobile   Cathy Silva Relative [phone]  [phone]   Cathy Silva Niece   [phone]   Cathy Silva   [phone]        I met face to face with patient and family at facility. Palliative Care was asked to follow this patient by consultation request of Dr. Leanna Battles to address advance care planning, complex medical decision making and goals of care clarification. Cathy Silva came to visit patient. This is the initial visit.    ASSESSMENT AND / RECOMMENDATIONS:   Advance Care Planning: Our advance care planning conversation included a discussion about:    The value and importance of advance care planning  Difference between Hospice and Palliative care Exploration of goals of care in the event of a sudden injury or illness  Identification and preparation of a healthcare agent  Review and updating or creation of an  advance directive document . Decision not to resuscitate or to de-escalate disease focused treatments due to poor prognosis.  CODE STATUS:Patient is a Do Not Resuscitate  Goals of Care: Goals include to maximize quality of life and symptom management.  I spent  20 minutes providing this initial consultation. More than 50% of the time in this consultation was spent on counseling patient and coordinating communication. --------------------------------------------------------------------------------------------------------------------------------------  Symptom  Management/Plan:  Hemiplagia/hemiparesis: Related to recent CVA. PT/OT is ongoing. For strengthening and mobility. Discussed Fall precautions. Follow up with Cardiologist as planned.  Dysphagia: Mecahnical soft diet, nectar thick liquid. Aspiration precautions in place. ST following Depression: Managed with Lexapro. Psych consult as needed.  Routine CBC BMP. Follow up: Palliative care will continue to follow for complex medical decision making, advance care planning, and clarification of goals. Return 6 weeks or prn.Encouraged to call provider sooner with any concerns.   Family /Caregiver/Community Supports: Patient in SNF for ongoing care  HOSPICE ELIGIBILITY/DIAGNOSIS: TBD  Chief Complaint: Initial Palliative care visit  HISTORY OF PRESENT ILLNESS:  Cathy Silva is a 87 y.o. year old female  with multiple medical conditions including recent R side hemiplegia/hemiparesis relate to recent CVA for which she was hospitalized 11/7-11/15/2022. Notes reviewed from Neurology indicates patient had an acute left MCA branch CVA as well as right periventricular white matter subcortical infarct likely cardioembolic in origin . Residual Right side paresis and dysphagia has impaired her quality of life as she is non ambulatory and limited in her ADLs.  She denies pain/discomfort, endorses right side weakness; she reports ongoing physical therapy is helpful. History of HTN, Aortic atherosclerosis, Hyperlipidemia History obtained from review of EMR, discussion with primary team, caregiver, family and/or Ms. Cathy Silva.  Review and summarization of Epic records shows history from other than patient. Rest of 10 point ROS asked and negative.  I reviewed, as needed, available labs, patient records, imaging, studies and related documents from the EMR.  Physical Exam: Constitutional: NAD General: Well groomed, cooperative EYES: anicteric sclera, lids intact, no discharge  ENMT: Moist mucous membrane CV: S1 S2,  RRR, no LE edema Pulmonary: LCTA, no increased work of breathing, no  cough, Abdomen: active BS + 4 quadrants, soft and non tender GU: no suprapubic tenderness, foley cath in place MSK: Right side weakness, limited ROM Skin: warm and dry, no rashes or wounds on visible skin Neuro:  weakness, otherwise non focal Psych: non-anxious affect Hem/lymph/immuno: no widespread bruising   PAST MEDICAL HISTORY:  Active Ambulatory Problems    Diagnosis Date Noted   Hyponatremia 05/07/2015   Essential hypertension 05/07/2015   Nausea & vomiting 05/07/2015   Back pain 05/07/2015   Mid back pain    Nonrheumatic aortic valve stenosis 06/26/2020   Aortic atherosclerosis ([place]) 06/26/2020   Hyperlipidemia 06/26/2020   RBBB 06/26/2020   Rhabdomyolysis 02/24/2021   Acute urinary retention 03/02/2021   UTI (urinary tract infection) 03/02/2021   Elevated LFTs 03/02/2021   Elevated troponin 03/02/2021   Leukocytosis 03/02/2021   Normocytic normochromic anemia 03/02/2021   CVA (cerebral vascular accident) ([place]) 03/05/2021   Acute CVA (cerebrovascular accident) ([place]) 03/05/2021   History of stroke 03/21/2021   Resolved Ambulatory Problems    Diagnosis Date Noted   No Resolved Ambulatory Problems   Past Medical History:  Diagnosis Date   Allergies    BCC (basal cell carcinoma), arm 2008   Elevated cholesterol    Estrogen deficiency    Fracture    Hx of scarlet fever    Hypercholesteremia    Insomnia    Low sodium levels    Melanoma in situ ([place]) 2009   Mitral valve prolapse    Murmur    Psychosocial problem    Thrombocytosis     SOCIAL HX:  Social History   Tobacco Use   Smoking status: Never   Smokeless tobacco: Never  Substance Use Topics   Alcohol use: No    Alcohol/week: 0.0 standard drinks     FAMILY HX:  Family History  Problem Relation Age of Onset   Heart attack Maternal Grandmother    CAD Mother    Tuberculosis Father    Hemangiomas Brother       ALLERGIES:   Allergies  Allergen Reactions   Novocain [Procaine] Shortness Of Breath and Other (See Comments)    "Deathly allergic to this"   Codeine Other (See Comments)    Reaction not recalled by the patient- stated it was "a long time ago"   Pneumococcal Vaccines Other (See Comments)    Reaction not recalled by the patient- stated it was "a long time ago"   Pseudoephedrine Other (See Comments)    Reaction not recalled by the patient- stated it was "a long time ago"   Tetanus Toxoids Swelling and Other (See Comments)    Swelling at injecton site   Tetanus-Diphtheria Toxoids Td Swelling and Other (See Comments)    Swelling where injected      PERTINENT MEDICATIONS:  Outpatient Encounter Medications as of 03/30/2021  Medication Sig   Ascorbic Acid (VITAMIN C) 1000 MG tablet Take 1,000 mg by mouth daily.   aspirin EC 81 MG EC tablet Take 1 tablet (81 mg total) by mouth daily. Swallow whole.   Calcium Carbonate-Vitamin D (CALCIUM + D PO) Take 1,200 mg by mouth.   Doxycycline Hyclate 50 MG TABS Take 100 mg by mouth 2 (two) times daily. Pt instructed to take this medication for only ten days for her arm   escitalopram (LEXAPRO) 5 MG tablet Take 5 mg by mouth daily.   feeding supplement (BOOST / RESOURCE BREEZE) LIQD Take 1 Container by mouth 3 (three) times daily between meals. (Patient  taking differently: Take 1 Container by mouth daily.)   Multiple Vitamins-Minerals (MULTIVITAMIN ADULT) CHEW Chew 1 tablet by mouth daily.   Multiple Vitamins-Minerals (MULTIVITAMIN PO) Take 1 tablet by mouth daily with breakfast.   Omega-3 Fatty Acids (FISH OIL) 600 MG CAPS Take 600 mg by mouth in the morning and at bedtime.   rosuvastatin (CRESTOR) 20 MG tablet Take 1 tablet (20 mg total) by mouth daily.   tamsulosin (FLOMAX) 0.4 MG CAPS capsule Take 1 capsule (0.4 mg total) by mouth daily.   No facility-administered encounter medications on file as of 03/30/2021.    Thank you for the opportunity to participate in  the care of Ms. Maple.  The palliative care team will continue to follow. Please call our office at [phone] if we can be of additional assistance.   Note: Portions of this note were generated with Lobbyist. Dictation errors may occur despite best attempts at proofreading.  Teodoro Spray, NP

## 2021-04-02 ENCOUNTER — Other Ambulatory Visit: Payer: Self-pay | Admitting: *Deleted

## 2021-04-02 NOTE — Patient Outreach (Signed)
Member screened for potential [facility] Care Management needs. Verified in [facility] member remains in [facility].   Communication sent to [facility] to inquire about transition plans and potential [facility] needs.   Will continue to follow.   Marthenia Rolling, MSN, RN,BSN [facility] Acute Care Coordinator [phone] [facility]) [phone]  (Toll free office)

## 2021-04-02 NOTE — Patient Outreach (Signed)
[facility] Coordinator follow up. Mrs. Allegretto resides in [facility]. Screened for potential [facility] Care Management needs.   Update received from Ebony Hail, [facility] SW indicating Mrs. Neuberger will remain long term at [facility] until a bed becomes available at [facility]   No identifiable [facility] Care Management needs at this time.    Marthenia Rolling, MSN, RN,BSN [facility] Acute Care Coordinator [phone] [facility]) [phone]  (Toll free office)

## 2021-04-03 ENCOUNTER — Other Ambulatory Visit: Payer: Self-pay

## 2021-04-03 ENCOUNTER — Ambulatory Visit (HOSPITAL_COMMUNITY)
Admission: RE | Admit: 2021-04-03 | Discharge: 2021-04-03 | Disposition: A | Discharge status: Home / Self Care | Payer: Medicare Other | Source: Ambulatory Visit | Attending: Internal Medicine | Admitting: Internal Medicine

## 2021-04-03 DIAGNOSIS — R059 Cough, unspecified: Secondary | ICD-10-CM | POA: Insufficient documentation

## 2021-04-03 DIAGNOSIS — R6339 Other feeding difficulties: Secondary | ICD-10-CM | POA: Diagnosis not present

## 2021-04-03 DIAGNOSIS — R338 Other retention of urine: Secondary | ICD-10-CM | POA: Diagnosis not present

## 2021-04-03 DIAGNOSIS — D649 Anemia, unspecified: Secondary | ICD-10-CM | POA: Diagnosis not present

## 2021-04-03 DIAGNOSIS — M150 Primary generalized (osteo)arthritis: Secondary | ICD-10-CM | POA: Diagnosis not present

## 2021-04-03 DIAGNOSIS — I69391 Dysphagia following cerebral infarction: Secondary | ICD-10-CM | POA: Diagnosis not present

## 2021-04-03 DIAGNOSIS — R1312 Dysphagia, oropharyngeal phase: Secondary | ICD-10-CM

## 2021-04-03 DIAGNOSIS — R54 Age-related physical debility: Secondary | ICD-10-CM | POA: Diagnosis not present

## 2021-04-03 DIAGNOSIS — I69351 Hemiplegia and hemiparesis following cerebral infarction affecting right dominant side: Secondary | ICD-10-CM | POA: Diagnosis not present

## 2021-04-03 DIAGNOSIS — F3289 Other specified depressive episodes: Secondary | ICD-10-CM | POA: Diagnosis not present

## 2021-04-03 DIAGNOSIS — R1310 Dysphagia, unspecified: Secondary | ICD-10-CM | POA: Insufficient documentation

## 2021-04-03 DIAGNOSIS — I10 Essential (primary) hypertension: Secondary | ICD-10-CM | POA: Diagnosis not present

## 2021-04-03 DIAGNOSIS — E785 Hyperlipidemia, unspecified: Secondary | ICD-10-CM | POA: Diagnosis not present

## 2021-04-03 NOTE — Therapy (Signed)
Modified Barium Swallow Progress Note  Patient Details  Name: Cathy Silva MRN: [id number] Date of Birth: [birth year]  Today's Date: 04/03/2021  Modified Barium Swallow completed.  Full report located under Chart Review in the Imaging Section.  Brief recommendations include the following:  Clinical Impression Pt presents with mild oropharyngeal dysphagia, characterized by extended oral prep of solid textures, trigger of the swallow reflex at the vallecular sinus across consistencies, trace vallecular residue after the swallow, and intermittent trace penetration of thin and nectar thick liquids. No aspiration was seen, however, pt is at increased risk for aspiration, especially when fatigued. Pt is sensate to penetration, with immediate throat clear or cough elicited consistently. No penetration was noted on a single bolus of thin liquid with head turn left. At this time, recommend soft chopped moist solids, thin liquids via cup/straw. Small bites/sips at a slow rate recommended. Position upright during and for 30 minutes after meals, as esophageal sweep revealed it to be slow to clear. Safe swallow precautions and strategies for management of esophageal dysmotility were reviewed with pt/daughter. This information was also provided in written form. Recommend pt follow up with speech pathologist at [facility] for continued dysphagia treatment.    Swallow Evaluation Recommendations  SLP Diet Recommendations: Dysphagia 3 (Mech soft) solids;Thin liquid   Liquid Administration via: Cup;Straw   Medication Administration: Crushed with puree   Supervision: Patient able to self feed;Staff to assist with self feeding;Full supervision/cueing for compensatory strategies   Compensations: Minimize environmental distractions;Slow rate;Small sips/bites;Other (Comment) (check oral cavity for pocketing during and after meals)   Postural Changes: Seated upright at 90 degrees;Remain semi-upright after after  feeds/meals (Comment)   Oral Care Recommendations: Oral care BID     Xitlalic Maslin B. Quentin Ore, [facility], [place] Speech Language Pathologist Office: [phone]  Shonna Chock 04/03/2021,1:19 PM

## 2021-04-04 ENCOUNTER — Telehealth: Payer: Self-pay | Admitting: Physician Assistant

## 2021-04-04 DIAGNOSIS — L89622 Pressure ulcer of left heel, stage 2: Secondary | ICD-10-CM | POA: Diagnosis not present

## 2021-04-04 DIAGNOSIS — L89616 Pressure-induced deep tissue damage of right heel: Secondary | ICD-10-CM | POA: Diagnosis not present

## 2021-04-04 NOTE — Telephone Encounter (Signed)
Returned call to [facility]. They have read through the instruction book and will apply the monitor to the patient tomorrow morning. I informed them the doctor would like patient to wear for 30 day and they know to call me back if they have more questions.

## 2021-04-04 NOTE — Telephone Encounter (Signed)
Aja from [facility] calling to find out how long the patient is supposed to have the monitor on and how it works. She states the patient's family just gave it to the. Phone: [phone]

## 2021-04-05 DIAGNOSIS — M150 Primary generalized (osteo)arthritis: Secondary | ICD-10-CM | POA: Diagnosis not present

## 2021-04-05 DIAGNOSIS — I639 Cerebral infarction, unspecified: Secondary | ICD-10-CM

## 2021-04-05 DIAGNOSIS — R338 Other retention of urine: Secondary | ICD-10-CM | POA: Diagnosis not present

## 2021-04-05 DIAGNOSIS — I69391 Dysphagia following cerebral infarction: Secondary | ICD-10-CM | POA: Diagnosis not present

## 2021-04-05 DIAGNOSIS — E785 Hyperlipidemia, unspecified: Secondary | ICD-10-CM | POA: Diagnosis not present

## 2021-04-05 DIAGNOSIS — I69991 Dysphagia following unspecified cerebrovascular disease: Secondary | ICD-10-CM | POA: Diagnosis not present

## 2021-04-05 DIAGNOSIS — I4891 Unspecified atrial fibrillation: Secondary | ICD-10-CM | POA: Diagnosis not present

## 2021-04-05 DIAGNOSIS — F3289 Other specified depressive episodes: Secondary | ICD-10-CM | POA: Diagnosis not present

## 2021-04-05 DIAGNOSIS — I10 Essential (primary) hypertension: Secondary | ICD-10-CM | POA: Diagnosis not present

## 2021-04-05 DIAGNOSIS — R54 Age-related physical debility: Secondary | ICD-10-CM | POA: Diagnosis not present

## 2021-04-05 DIAGNOSIS — D649 Anemia, unspecified: Secondary | ICD-10-CM | POA: Diagnosis not present

## 2021-04-05 DIAGNOSIS — I69351 Hemiplegia and hemiparesis following cerebral infarction affecting right dominant side: Secondary | ICD-10-CM | POA: Diagnosis not present

## 2021-04-05 DIAGNOSIS — R1314 Dysphagia, pharyngoesophageal phase: Secondary | ICD-10-CM | POA: Diagnosis not present

## 2021-04-06 ENCOUNTER — Ambulatory Visit (INDEPENDENT_AMBULATORY_CARE_PROVIDER_SITE_OTHER): Payer: Medicare Other

## 2021-04-06 DIAGNOSIS — I4891 Unspecified atrial fibrillation: Secondary | ICD-10-CM

## 2021-04-06 DIAGNOSIS — I639 Cerebral infarction, unspecified: Secondary | ICD-10-CM

## 2021-04-09 DIAGNOSIS — R1314 Dysphagia, pharyngoesophageal phase: Secondary | ICD-10-CM | POA: Diagnosis not present

## 2021-04-09 DIAGNOSIS — I639 Cerebral infarction, unspecified: Secondary | ICD-10-CM | POA: Diagnosis not present

## 2021-04-09 DIAGNOSIS — I69991 Dysphagia following unspecified cerebrovascular disease: Secondary | ICD-10-CM | POA: Diagnosis not present

## 2021-04-10 DIAGNOSIS — F3289 Other specified depressive episodes: Secondary | ICD-10-CM | POA: Diagnosis not present

## 2021-04-10 DIAGNOSIS — R54 Age-related physical debility: Secondary | ICD-10-CM | POA: Diagnosis not present

## 2021-04-10 DIAGNOSIS — I69391 Dysphagia following cerebral infarction: Secondary | ICD-10-CM | POA: Diagnosis not present

## 2021-04-10 DIAGNOSIS — D649 Anemia, unspecified: Secondary | ICD-10-CM | POA: Diagnosis not present

## 2021-04-10 DIAGNOSIS — I69351 Hemiplegia and hemiparesis following cerebral infarction affecting right dominant side: Secondary | ICD-10-CM | POA: Diagnosis not present

## 2021-04-10 DIAGNOSIS — I10 Essential (primary) hypertension: Secondary | ICD-10-CM | POA: Diagnosis not present

## 2021-04-10 DIAGNOSIS — M150 Primary generalized (osteo)arthritis: Secondary | ICD-10-CM | POA: Diagnosis not present

## 2021-04-10 DIAGNOSIS — E785 Hyperlipidemia, unspecified: Secondary | ICD-10-CM | POA: Diagnosis not present

## 2021-04-10 DIAGNOSIS — R338 Other retention of urine: Secondary | ICD-10-CM | POA: Diagnosis not present

## 2021-04-11 DIAGNOSIS — L89616 Pressure-induced deep tissue damage of right heel: Secondary | ICD-10-CM | POA: Diagnosis not present

## 2021-04-11 DIAGNOSIS — L89622 Pressure ulcer of left heel, stage 2: Secondary | ICD-10-CM | POA: Diagnosis not present

## 2021-04-14 DIAGNOSIS — M62461 Contracture of muscle, right lower leg: Secondary | ICD-10-CM | POA: Diagnosis not present

## 2021-04-14 DIAGNOSIS — I69351 Hemiplegia and hemiparesis following cerebral infarction affecting right dominant side: Secondary | ICD-10-CM | POA: Diagnosis not present

## 2021-04-14 DIAGNOSIS — U071 COVID-19: Secondary | ICD-10-CM | POA: Diagnosis not present

## 2021-04-16 DIAGNOSIS — I69991 Dysphagia following unspecified cerebrovascular disease: Secondary | ICD-10-CM | POA: Diagnosis not present

## 2021-04-16 DIAGNOSIS — R1314 Dysphagia, pharyngoesophageal phase: Secondary | ICD-10-CM | POA: Diagnosis not present

## 2021-04-16 DIAGNOSIS — I639 Cerebral infarction, unspecified: Secondary | ICD-10-CM | POA: Diagnosis not present

## 2021-04-17 DIAGNOSIS — R338 Other retention of urine: Secondary | ICD-10-CM | POA: Diagnosis not present

## 2021-04-17 DIAGNOSIS — F3289 Other specified depressive episodes: Secondary | ICD-10-CM | POA: Diagnosis not present

## 2021-04-17 DIAGNOSIS — I69391 Dysphagia following cerebral infarction: Secondary | ICD-10-CM | POA: Diagnosis not present

## 2021-04-17 DIAGNOSIS — I69991 Dysphagia following unspecified cerebrovascular disease: Secondary | ICD-10-CM | POA: Diagnosis not present

## 2021-04-17 DIAGNOSIS — R54 Age-related physical debility: Secondary | ICD-10-CM | POA: Diagnosis not present

## 2021-04-17 DIAGNOSIS — I69351 Hemiplegia and hemiparesis following cerebral infarction affecting right dominant side: Secondary | ICD-10-CM | POA: Diagnosis not present

## 2021-04-17 DIAGNOSIS — R1314 Dysphagia, pharyngoesophageal phase: Secondary | ICD-10-CM | POA: Diagnosis not present

## 2021-04-17 DIAGNOSIS — E785 Hyperlipidemia, unspecified: Secondary | ICD-10-CM | POA: Diagnosis not present

## 2021-04-17 DIAGNOSIS — M150 Primary generalized (osteo)arthritis: Secondary | ICD-10-CM | POA: Diagnosis not present

## 2021-04-17 DIAGNOSIS — I639 Cerebral infarction, unspecified: Secondary | ICD-10-CM | POA: Diagnosis not present

## 2021-04-17 DIAGNOSIS — I10 Essential (primary) hypertension: Secondary | ICD-10-CM | POA: Diagnosis not present

## 2021-04-17 DIAGNOSIS — D649 Anemia, unspecified: Secondary | ICD-10-CM | POA: Diagnosis not present

## 2021-04-17 DIAGNOSIS — U071 COVID-19: Secondary | ICD-10-CM | POA: Diagnosis not present

## 2021-04-18 ENCOUNTER — Inpatient Hospital Stay: Payer: Medicare Other | Admitting: Adult Health

## 2021-04-18 DIAGNOSIS — L89622 Pressure ulcer of left heel, stage 2: Secondary | ICD-10-CM | POA: Diagnosis not present

## 2021-04-19 DIAGNOSIS — E785 Hyperlipidemia, unspecified: Secondary | ICD-10-CM | POA: Diagnosis not present

## 2021-04-19 DIAGNOSIS — I69391 Dysphagia following cerebral infarction: Secondary | ICD-10-CM | POA: Diagnosis not present

## 2021-04-19 DIAGNOSIS — I10 Essential (primary) hypertension: Secondary | ICD-10-CM | POA: Diagnosis not present

## 2021-04-19 DIAGNOSIS — I69351 Hemiplegia and hemiparesis following cerebral infarction affecting right dominant side: Secondary | ICD-10-CM | POA: Diagnosis not present

## 2021-04-19 DIAGNOSIS — Z960 Presence of urogenital implants: Secondary | ICD-10-CM | POA: Diagnosis not present

## 2021-04-19 DIAGNOSIS — M150 Primary generalized (osteo)arthritis: Secondary | ICD-10-CM | POA: Diagnosis not present

## 2021-04-19 DIAGNOSIS — F3289 Other specified depressive episodes: Secondary | ICD-10-CM | POA: Diagnosis not present

## 2021-04-19 DIAGNOSIS — R338 Other retention of urine: Secondary | ICD-10-CM | POA: Diagnosis not present

## 2021-04-19 DIAGNOSIS — U071 COVID-19: Secondary | ICD-10-CM | POA: Diagnosis not present

## 2021-04-19 DIAGNOSIS — D649 Anemia, unspecified: Secondary | ICD-10-CM | POA: Diagnosis not present

## 2021-04-19 DIAGNOSIS — R54 Age-related physical debility: Secondary | ICD-10-CM | POA: Diagnosis not present

## 2021-04-23 DIAGNOSIS — I639 Cerebral infarction, unspecified: Secondary | ICD-10-CM | POA: Diagnosis not present

## 2021-04-23 DIAGNOSIS — I69991 Dysphagia following unspecified cerebrovascular disease: Secondary | ICD-10-CM | POA: Diagnosis not present

## 2021-04-23 DIAGNOSIS — R1314 Dysphagia, pharyngoesophageal phase: Secondary | ICD-10-CM | POA: Diagnosis not present

## 2021-04-24 DIAGNOSIS — D649 Anemia, unspecified: Secondary | ICD-10-CM | POA: Diagnosis not present

## 2021-04-24 DIAGNOSIS — E785 Hyperlipidemia, unspecified: Secondary | ICD-10-CM | POA: Diagnosis not present

## 2021-04-24 DIAGNOSIS — I69351 Hemiplegia and hemiparesis following cerebral infarction affecting right dominant side: Secondary | ICD-10-CM | POA: Diagnosis not present

## 2021-04-24 DIAGNOSIS — F3289 Other specified depressive episodes: Secondary | ICD-10-CM | POA: Diagnosis not present

## 2021-04-24 DIAGNOSIS — U071 COVID-19: Secondary | ICD-10-CM | POA: Diagnosis not present

## 2021-04-24 DIAGNOSIS — R338 Other retention of urine: Secondary | ICD-10-CM | POA: Diagnosis not present

## 2021-04-24 DIAGNOSIS — I10 Essential (primary) hypertension: Secondary | ICD-10-CM | POA: Diagnosis not present

## 2021-04-24 DIAGNOSIS — M150 Primary generalized (osteo)arthritis: Secondary | ICD-10-CM | POA: Diagnosis not present

## 2021-04-24 DIAGNOSIS — R54 Age-related physical debility: Secondary | ICD-10-CM | POA: Diagnosis not present

## 2021-04-24 DIAGNOSIS — Z960 Presence of urogenital implants: Secondary | ICD-10-CM | POA: Diagnosis not present

## 2021-04-24 DIAGNOSIS — I69391 Dysphagia following cerebral infarction: Secondary | ICD-10-CM | POA: Diagnosis not present

## 2021-04-25 DIAGNOSIS — L89622 Pressure ulcer of left heel, stage 2: Secondary | ICD-10-CM | POA: Diagnosis not present

## 2021-04-26 ENCOUNTER — Non-Acute Institutional Stay (SKILLED_NURSING_FACILITY): Payer: Medicare Other | Admitting: Internal Medicine

## 2021-04-26 ENCOUNTER — Encounter: Payer: Self-pay | Admitting: Internal Medicine

## 2021-04-26 DIAGNOSIS — R1310 Dysphagia, unspecified: Secondary | ICD-10-CM | POA: Diagnosis not present

## 2021-04-26 DIAGNOSIS — I10 Essential (primary) hypertension: Secondary | ICD-10-CM | POA: Diagnosis not present

## 2021-04-26 DIAGNOSIS — Z87898 Personal history of other specified conditions: Secondary | ICD-10-CM

## 2021-04-26 DIAGNOSIS — G8191 Hemiplegia, unspecified affecting right dominant side: Secondary | ICD-10-CM

## 2021-04-26 DIAGNOSIS — F32A Depression, unspecified: Secondary | ICD-10-CM

## 2021-04-26 DIAGNOSIS — E785 Hyperlipidemia, unspecified: Secondary | ICD-10-CM | POA: Diagnosis not present

## 2021-04-26 NOTE — Progress Notes (Signed)
Provider:  Veleta Miners MD Location:   [place] Room Number: [id number] Place of Service:  SNF (31)  PCP: [name], MD Patient Care Team: [name], MD as PCP - General (Family Medicine) Werner Lean, MD as PCP - Cardiology (Cardiology) Werner Lean, MD as Consulting Physician (Cardiology)  Extended Emergency Contact Information Primary Emergency Contact: [name],[name] Address: [address]          [place] [address] [name] of [place] Phone: [phone] Mobile Phone: [phone] Relation: Relative Secondary Emergency Contact: [place] Mobile Phone: [phone] Relation: Niece Preferred language: English Interpreter needed? No  Code Status: DNR Goals of Care: Advanced Directive information Advanced Directives 04/26/2021  Does Patient Have a Medical Advance Directive? Yes  Type of Paramedic of [place];Living will  Does patient want to make changes to medical advance directive? No - Patient declined  Copy of [place] in Chart? Yes - validated most recent copy scanned in chart (See row information)  Would patient like information on creating a medical advance directive? -      Chief Complaint  Patient presents with   New Admit To SNF    Admission to SNF    HPI: Patient is a 87 y.o. female seen today for admission to SNF for Long tern care and therapy  Patient has h/o  Admitted in the hospital from 02/24/21-03/01/21 after a fall with Traumatic Rhabdomyolysis Rehab in [place] Admitted again in 11/7-11/15 for Acute metabolic encephalopathy with Right sided weakness due to Acute CVA Etiology ? PAF and Moderate Atherosclerosis disease. No Anticoagulation due to Fall risk H/o Aortic stenosis Hypertension Urinary Retention HLD   [facility] to be close to the family Patient has dense Right Flaccid hemiparesis Also has  aphasia and Slurred speech. Was c/o Discomfort in her Right hands Denied any other acute issue.  Patient had recent Covid infection. Is asymptomatic now Past Medical History:  Diagnosis Date   Allergies    BCC (basal cell carcinoma), arm 2008   Left    Elevated cholesterol    Estrogen deficiency    Fracture    in back   Hx of scarlet fever    Hypercholesteremia    Hyponatremia    Insomnia    Low sodium levels    Melanoma in situ ([place]) 2009   Mitral valve prolapse    Murmur    Psychosocial problem    Thrombocytosis    Past Surgical History:  Procedure Laterality Date   BALLOON DILATION N/A 05/11/2015   Procedure: BALLOON DILATION;  Surgeon: Carol Ada, MD;  Location[facility] ENDOSCOPY;  Service: Endoscopy;  Laterality: N/A;   DILATION AND CURETTAGE OF UTERUS  05/1984   Prolif   DILATION AND CURETTAGE OF UTERUS  06/1986   benign   ESOPHAGOGASTRODUODENOSCOPY Left 05/11/2015   Procedure: ESOPHAGOGASTRODUODENOSCOPY (EGD);  Surgeon: Carol Ada, MD;  Location: [facility] ENDOSCOPY;  Service: Endoscopy;  Laterality: Left;   EYE SURGERY     bilat cataract   TONSILLECTOMY     TOTAL ABDOMINAL HYSTERECTOMY W/ BILATERAL SALPINGOOPHORECTOMY  05/1987   Leiomyoma    reports that she has never smoked. She has never used smokeless tobacco. She reports that she does not drink alcohol and does not use drugs. Social History   Socioeconomic History   Marital status: Widowed    Spouse name: Not on file   Number of children: Not on file   Years of education:  Not on file   Highest education level: Not on file  Occupational History   Not on file  Tobacco Use   Smoking status: Never   Smokeless tobacco: Never  Substance and Sexual Activity   Alcohol use: No    Alcohol/week: 0.0 standard drinks   Drug use: No   Sexual activity: Not Currently    Birth control/protection: Surgical    Comment: [facility]  Other Topics Concern   Not on file  Social History Narrative   Not on file   Social Determinants of  Health   Financial Resource Strain: Not on file  Food Insecurity: Not on file  Transportation Needs: Not on file  Physical Activity: Not on file  Stress: Not on file  Social Connections: Not on file  Intimate Partner Violence: Not on file    Functional Status Survey:    Family History  Problem Relation Age of Onset   Heart attack Maternal Grandmother    CAD Mother    Tuberculosis Father    Hemangiomas Brother     Health Maintenance  Topic Date Due   COVID-19 Vaccine (1) Never done   Pneumonia Vaccine 71+ Years old (1 - PCV) Never done   TETANUS/TDAP  Never done   Zoster Vaccines- Shingrix (1 of 2) Never done   INFLUENZA VACCINE  11/27/2020   DEXA SCAN  Completed   HPV VACCINES  Aged Out    Allergies  Allergen Reactions   Novocain [Procaine] Shortness Of Breath and Other (See Comments)    "Deathly allergic to this"   Codeine Other (See Comments)    Reaction not recalled by the patient- stated it was "a long time ago"   Pneumococcal Vaccines Other (See Comments)    Reaction not recalled by the patient- stated it was "a long time ago"   Pseudoephedrine Other (See Comments)    Reaction not recalled by the patient- stated it was "a long time ago"   Tetanus Toxoids Swelling and Other (See Comments)    Swelling at injecton site   Tetanus-Diphtheria Toxoids Td Swelling and Other (See Comments)    Swelling where injected    Allergies as of 04/26/2021       Reactions   Novocain [procaine] Shortness Of Breath, Other (See Comments)   "Deathly allergic to this"   Codeine Other (See Comments)   Reaction not recalled by the patient- stated it was "a long time ago"   Pneumococcal Vaccines Other (See Comments)   Reaction not recalled by the patient- stated it was "a long time ago"   Pseudoephedrine Other (See Comments)   Reaction not recalled by the patient- stated it was "a long time ago"   Tetanus Toxoids Swelling, Other (See Comments)   Swelling at injecton site    Tetanus-diphtheria Toxoids Td Swelling, Other (See Comments)   Swelling where injected        Medication List        Accurate as of April 26, 2021  3:53 PM. If you have any questions, ask your nurse or doctor.          STOP taking these medications    Doxycycline Hyclate 50 MG Tabs Stopped by: Virgie Dad, MD   feeding supplement Liqd Stopped by: Virgie Dad, MD   Fish Oil 600 MG Caps Stopped by: Virgie Dad, MD   MULTIVITAMIN PO Stopped by: Virgie Dad, MD       TAKE these medications    acetaminophen 325 MG  tablet Commonly known as: TYLENOL Take 650 mg by mouth every 6 (six) hours as needed.   aspirin 81 MG EC tablet Take 1 tablet (81 mg total) by mouth daily. Swallow whole.   CALCIUM + D PO Take 1,200 mg by mouth.   Dermacloud Oint Apply topically 3 (three) times daily.   escitalopram 10 MG tablet Commonly known as: LEXAPRO Take 10 mg by mouth daily.   Multivitamin Adult Chew Chew 1 tablet by mouth daily.   rosuvastatin 20 MG tablet Commonly known as: CRESTOR Take 1 tablet (20 mg total) by mouth daily.   tamsulosin 0.4 MG Caps capsule Commonly known as: FLOMAX Take 0.4 mg by mouth daily.   Tubersol 5 UNIT/0.1ML injection Generic drug: tuberculin Inject 5 Units into the skin once.   vitamin C 1000 MG tablet Take 1,000 mg by mouth daily.   zinc oxide 20 % ointment Apply 1 application topically as needed for irritation.        Review of Systems  Constitutional:  Negative for activity change and appetite change.  HENT: Negative.    Respiratory:  Negative for cough and shortness of breath.   Cardiovascular:  Negative for leg swelling.  Gastrointestinal:  Negative for constipation.  Genitourinary:  Positive for difficulty urinating.  Musculoskeletal:  Positive for gait problem and myalgias. Negative for arthralgias.  Skin: Negative.   Neurological:  Positive for weakness. Negative for dizziness.   Psychiatric/Behavioral:  Positive for confusion and dysphoric mood. Negative for sleep disturbance.    Vitals:   04/26/21 1525  BP: (!) 166/84  Pulse: 92  Resp: 16  Temp: (!) 97.4 F (36.3 C)  SpO2: 97%  Weight: 137 lb 3.2 oz (62.2 kg)  Height: 5\' 7"  (1.702 m)   Body mass index is 21.49 kg/m. Physical Exam Vitals reviewed.  Constitutional:      Comments: Right Facial Droop  HENT:     Head: Normocephalic.     Nose: Nose normal.     Mouth/Throat:     Mouth: Mucous membranes are moist.     Pharynx: Oropharynx is clear.  Eyes:     Pupils: Pupils are equal, round, and reactive to light.  Cardiovascular:     Rate and Rhythm: Normal rate and regular rhythm.     Pulses: Normal pulses.     Heart sounds: Murmur heard.  Pulmonary:     Effort: Pulmonary effort is normal.     Breath sounds: Normal breath sounds.  Abdominal:     General: Abdomen is flat. Bowel sounds are normal.     Palpations: Abdomen is soft.  Musculoskeletal:        General: No swelling.     Cervical back: Neck supple.  Skin:    General: Skin is warm.  Neurological:     Mental Status: She is alert.     Comments: Dense Right Flaccid Hemiparesis with Aphasia  Psychiatric:        Mood and Affect: Mood normal.        Thought Content: Thought content normal.    Labs reviewed: Basic Metabolic Panel: Recent Labs    02/25/21 0300 02/26/21 0121 03/11/21 0842 03/11/21 0901 03/12/21 0338 03/13/21 0207  NA 134*   < > 132*  --  133* 134*  K 3.7   < > 4.1  --  4.1 4.2  CL 103   < > 100  --  103 103  CO2 20*   < > 25  --  23 25  GLUCOSE 98   < > 120*  --  123* 119*  BUN 26*   < > 10  --  15 13  CREATININE 0.79   < > 0.56  --  0.57 0.56  CALCIUM 8.5*   < > 8.5*  --  8.2* 8.2*  MG 2.1   < >  --  2.0 2.0 2.0  PHOS 3.2  --   --   --   --   --    < > = values in this interval not displayed.   Liver Function Tests: Recent Labs    03/10/21 0330 03/12/21 0338 03/13/21 0207  AST 21 20 24   ALT 22 20 25    ALKPHOS 52 44 49  BILITOT 0.8 0.6 0.5  PROT 5.3* 5.1* 5.1*  ALBUMIN 2.4* 2.2* 2.1*   No results for input(s): LIPASE, AMYLASE in the last 8760 hours. Recent Labs    03/11/21 1616  AMMONIA <10   CBC: Recent Labs    03/10/21 0330 03/11/21 0842 03/12/21 0338 03/13/21 0207  WBC 14.9* 11.1* 10.7* 9.2  NEUTROABS 11.5*  --  7.4 5.8  HGB 11.5* 11.0* 10.2* 9.7*  HCT 34.4* 32.7* 31.4* 30.2*  MCV 83.3 83.6 85.6 85.1  PLT 435* 428* 411* 435*   Cardiac Enzymes: Recent Labs    02/26/21 0121 02/27/21 0106 02/28/21 0119  CKTOTAL 943* 642* 388*   BNP: Invalid input(s): POCBNP Lab Results  Component Value Date   HGBA1C 5.6 03/06/2021   Lab Results  Component Value Date   TSH 2.867 03/05/2021   Lab Results  Component Value Date   OIZTIWPY09 983 03/11/2021   No results found for: FOLATE Lab Results  Component Value Date   IRON 42 02/19/2016   TIBC 268 02/19/2016   FERRITIN 471 (H) 02/19/2016    Imaging and Procedures obtained prior to SNF admission: DG SWALLOW FUNC OP MEDICARE SPEECH PATH  Result Date: 04/03/2021 Table formatting from the original result was not included. CLINICAL DATA:  dysphagia EXAM: MODIFIED BARIUM SWALLOW TECHNIQUE: Different consistencies of barium were administered orally to the patient by the Speech Pathologist. Imaging of the pharynx was performed in the lateral projection. Rushie Nyhan NP was present in the fluoroscopy room during this study, which was supervised and interpreted by Dr. Maurine Simmering, MD. FLUOROSCOPY TIME:  Fluoroscopy Time:  2 minutes 25 seconds Radiation Exposure Index (if provided by the fluoroscopic device): 14.65 mGy Number of Acquired Spot Images: Multiple cine clips. COMPARISON:  NONE. FINDINGS: Vestibular Penetration: Flash penetration is seen with thin and nectar thick liquids. Aspiration:  None seen. Other:  None. IMPRESSION: Flash penetration seen with thin and nectar thick liquids. No intra tracheal aspiration. Please  refer to the Speech Pathologists report for complete details and recommendations. Electronically Signed   By: Maurine Simmering M.D.   On: 04/03/2021 12:50 Objective Swallowing Evaluation: Type of Study: MBS-Modified Barium Swallow Study  Patient Details Name: Cathy Silva MRN: [id number] Date of Birth: [birth year] Today's Date: 04/03/2021 Time: 11:30 Past Medical History: Past Medical History: Diagnosis Date  Allergies   BCC (basal cell carcinoma), arm 2008  Left   Elevated cholesterol   Estrogen deficiency   Fracture   in back  Hx of scarlet fever   Hypercholesteremia   Hyponatremia   Insomnia   Low sodium levels   Melanoma in situ ([place]) 2009  Mitral valve prolapse   Murmur   Psychosocial problem   Thrombocytosis  Past Surgical History: Past Surgical History: Procedure Laterality  Date  BALLOON DILATION N/A 05/11/2015  Procedure: BALLOON DILATION;  Surgeon: Carol Ada, MD;  Location: [facility] ENDOSCOPY;  Service: Endoscopy;  Laterality: N/A;  DILATION AND CURETTAGE OF UTERUS  05/1984  Prolif  DILATION AND CURETTAGE OF UTERUS  06/1986  benign  ESOPHAGOGASTRODUODENOSCOPY Left 05/11/2015  Procedure: ESOPHAGOGASTRODUODENOSCOPY (EGD);  Surgeon: Carol Ada, MD;  Location: [facility] ENDOSCOPY;  Service: Endoscopy;  Laterality: Left;  EYE SURGERY    bilat cataract  TONSILLECTOMY    TOTAL ABDOMINAL HYSTERECTOMY W/ BILATERAL SALPINGOOPHORECTOMY  05/1987  Leiomyoma HPI: 87yo female admitted 03/05/21 with RUE weakness, facial droop and dysarthria.  PMH: back pain, HTN, hypercholesteremia, hyponatremia, recent admission for rhabdo after fall at home. MRI = acute left frontal MCA branch CVA  Subjective: Pt seen in radiology for outpatient MBS. Daughter in attendance  Recommendations for follow up therapy are one component of a multi-disciplinary discharge planning process, led by the attending physician.  Recommendations may be updated based on patient status, additional functional criteria and insurance authorization. Assessment / Plan /  Recommendation Clinical Impressions 04/03/2021 Clinical Impression Pt presents with mild oropharyngeal dysphagia, characterized by extended oral prep of solid textures, trigger of the swallow reflex at the vallecular sinus across consistencies, trace vallecular residue after the swallow, and intermittent trace penetration of thin and nectar thick liquids. No aspiration was seen, however, pt is at increased risk for aspiration, especially when fatigued. Pt is sensate to penetration, with immediate throat clear or cough elicited consistently. No penetration was noted on a single bolus of thin liquid with head turn left. At this time, recommend soft chopped moist solids, thin liquids via cup/straw. Small bites/sips at a slow rate recommended. Position upright during and for 30 minutes after meals, as esophageal sweep revealed it to be slow to clear. Safe swallow precautions and strategies for management of esophageal dysmotility were reviewed with pt/daughter. This information was also provided in written form. Recommend pt follow up with speech pathologist at [facility] for continued dysphagia treatment.  SLP Visit Diagnosis Dysphagia, pharyngeal phase (R13.13)     Impact on safety and function Moderate aspiration risk;Mild aspiration risk;Risk for inadequate nutrition/hydration   Treatment Recommendations 04/03/2021 Treatment Recommendations Defer treatment plan to f/u with SLP   Prognosis 04/03/2021 Prognosis for Safe Diet Advancement Fair Barriers to Reach Goals Language deficits   Diet Recommendations 04/03/2021 SLP Diet Recommendations Dysphagia 3 (Mech soft) solids Thin liquid  Liquid Administration via Cup Straw  Medication Administration Crushed with puree  Compensations Minimize environmental distractions Slow rate Small sips/bites  Postural Changes Seated upright at 90 degrees Remain semi-upright after after feeds/meals    Other Recommendations 04/03/2021  Recommended Consults SLP  Oral Care Recommendations Oral  care BID    Follow Up Recommendations Skilled nursing-short term rehab (<3 hours/day)  Assistance recommended at discharge Frequent or constant Supervision/Assistance  Functional Status Assessment Patient has had a recent decline in their functional status and/or demonstrates limited ability to make significant improvements in function in a reasonable and predictable amount of time    Oral Phase 04/03/2021 Oral Phase [facility]   Pharyngeal Phase 04/03/2021 Pharyngeal Phase Impaired    Cervical Esophageal Phase  04/03/2021 Cervical Esophageal Phase [facility] [address]  [facility] [place] Speech Language Pathologist Office: [phone] Shonna Chock 04/03/2021, 1:13 PM                      Assessment/Plan Right hemiparesis ([place]) MCA stroke Right now 2 person assist with  hoyer lift  Has  cardiac monitor to eval the A fib burden On aspirin only Continue statin and follow up with Neuro Therapy eval and treat Essential hypertension Was on amlodipine before Continue to monitor Hyperlipidemia, unspecified hyperlipidemia type On crestor Repeat Lipid panel  H/O urinary retention Foley Cathter and Flomax Urology follow up Depression, On Lexapro  Dysphagia, unspecified type Mechanical Soft Aortic stenosis Continue to monitor. Follows with Cardiology EF normal   Family/ staff Communication:   Labs/tests ordered:CBC,CMP,Lipid panel

## 2021-05-01 ENCOUNTER — Non-Acute Institutional Stay (SKILLED_NURSING_FACILITY): Payer: Medicare Other | Admitting: Nurse Practitioner

## 2021-05-01 ENCOUNTER — Encounter: Payer: Self-pay | Admitting: Nurse Practitioner

## 2021-05-01 DIAGNOSIS — F339 Major depressive disorder, recurrent, unspecified: Secondary | ICD-10-CM | POA: Diagnosis not present

## 2021-05-01 DIAGNOSIS — E871 Hypo-osmolality and hyponatremia: Secondary | ICD-10-CM

## 2021-05-01 DIAGNOSIS — I63413 Cerebral infarction due to embolism of bilateral middle cerebral arteries: Secondary | ICD-10-CM | POA: Diagnosis not present

## 2021-05-01 DIAGNOSIS — E782 Mixed hyperlipidemia: Secondary | ICD-10-CM | POA: Diagnosis not present

## 2021-05-01 DIAGNOSIS — D649 Anemia, unspecified: Secondary | ICD-10-CM

## 2021-05-01 DIAGNOSIS — R1310 Dysphagia, unspecified: Secondary | ICD-10-CM | POA: Diagnosis not present

## 2021-05-01 DIAGNOSIS — R338 Other retention of urine: Secondary | ICD-10-CM | POA: Diagnosis not present

## 2021-05-01 DIAGNOSIS — M549 Dorsalgia, unspecified: Secondary | ICD-10-CM | POA: Diagnosis not present

## 2021-05-01 DIAGNOSIS — I350 Nonrheumatic aortic (valve) stenosis: Secondary | ICD-10-CM | POA: Diagnosis not present

## 2021-05-01 DIAGNOSIS — I10 Essential (primary) hypertension: Secondary | ICD-10-CM

## 2021-05-01 NOTE — Assessment & Plan Note (Signed)
MCV stroke with right hemiparesis, mechanical lift for transfer, on ASA

## 2021-05-01 NOTE — Assessment & Plan Note (Signed)
Na 134, pending CMP

## 2021-05-01 NOTE — Assessment & Plan Note (Signed)
Continue diet modification, mechanical soft

## 2021-05-01 NOTE — Progress Notes (Signed)
Location:   SNF Central Room Number: 28 Place of Service:  SNF (31) Provider: [facility] Phares Zaccone NP  [name], MD  Patient Care Team: [name], MD as PCP - General (Family Medicine) Werner Lean, MD as PCP - Cardiology (Cardiology) Werner Lean, MD as Consulting Physician (Cardiology)  Extended Emergency Contact Information Primary Emergency Contact: [name],[name] Address: [place]          [place] [address] [name] of [place] Phone: [phone] Mobile Phone: [phone] Relation: Relative Secondary Emergency Contact: [place] Mobile Phone: [phone] Relation: Niece Preferred language: English Interpreter needed? No  Code Status:  DNR Goals of care: Advanced Directive information Advanced Directives 05/01/2021  Does Patient Have a Medical Advance Directive? Yes  Type of Paramedic of [place];Living will  Does patient want to make changes to medical advance directive? No - Patient declined  Copy of [place] in Chart? Yes - validated most recent copy scanned in chart (See row information)  Would patient like information on creating a medical advance directive? -     Chief Complaint  Patient presents with   Acute Visit    pain management    HPI:  Pt is a 88 y.o. female seen today for an acute visit for managing pain, prn Robaxin is not adequate, Tramadol was effective in the past.     OA, multiple sites, prn Tylenol, Tramadol, Robaxin  HTN, on Amlodipine, Bun/creat 13/0.56 03/13/21  Hyperlipidemia, on Crestor, LDL 141 03/06/21  Urinary retention, Foley, Flomax, f/u Urology  Depression, on Lexapro, TSH 2.867 03/05/21  Dysphagia, mechanical soft  Aortic stenosis, f/u Cardiology, normal EF in the past. BNP 266 03/13/21  MCV stroke with right hemiparesis, mechanical lift for transfer, on ASA  Anemia, Hgb 9.7 03/13/21, Vit B12 807 03/11/21   Hyponatremia, Na 134,  pending CMP   Past Medical History:  Diagnosis Date   Allergies    BCC (basal cell carcinoma), arm 2008   Left    Elevated cholesterol    Estrogen deficiency    Fracture    in back   Hx of scarlet fever    Hypercholesteremia    Hyponatremia    Insomnia    Low sodium levels    Melanoma in situ ([place]) 2009   Mitral valve prolapse    Murmur    Psychosocial problem    Thrombocytosis    Past Surgical History:  Procedure Laterality Date   BALLOON DILATION N/A 05/11/2015   Procedure: BALLOON DILATION;  Surgeon: Carol Ada, MD;  Location: [place];  Service: Endoscopy;  Laterality: N/A;   DILATION AND CURETTAGE OF UTERUS  05/1984   Prolif   DILATION AND CURETTAGE OF UTERUS  06/1986   benign   ESOPHAGOGASTRODUODENOSCOPY Left 05/11/2015   Procedure: ESOPHAGOGASTRODUODENOSCOPY (EGD);  Surgeon: Carol Ada, MD;  Location: [facility] ENDOSCOPY;  Service: Endoscopy;  Laterality: Left;   EYE SURGERY     bilat cataract   TONSILLECTOMY     TOTAL ABDOMINAL HYSTERECTOMY W/ BILATERAL SALPINGOOPHORECTOMY  05/1987   Leiomyoma    Allergies  Allergen Reactions   Novocain [Procaine] Shortness Of Breath and Other (See Comments)    "Deathly allergic to this"   Codeine Other (See Comments)    Reaction not recalled by the patient- stated it was "a long time ago"   Pneumococcal Vaccines Other (See Comments)    Reaction not recalled by the patient- stated it was "a long time ago"   Pseudoephedrine Other (  See Comments)    Reaction not recalled by the patient- stated it was "a long time ago"   Tetanus Toxoids Swelling and Other (See Comments)    Swelling at injecton site   Tetanus-Diphtheria Toxoids Td Swelling and Other (See Comments)    Swelling where injected    Allergies as of 05/01/2021       Reactions   Novocain [procaine] Shortness Of Breath, Other (See Comments)   "Deathly allergic to this"   Codeine Other (See Comments)   Reaction not recalled by the patient- stated it was "a long time  ago"   Pneumococcal Vaccines Other (See Comments)   Reaction not recalled by the patient- stated it was "a long time ago"   Pseudoephedrine Other (See Comments)   Reaction not recalled by the patient- stated it was "a long time ago"   Tetanus Toxoids Swelling, Other (See Comments)   Swelling at injecton site   Tetanus-diphtheria Toxoids Td Swelling, Other (See Comments)   Swelling where injected        Medication List        Accurate as of May 01, 2021 11:59 PM. If you have any questions, ask your nurse or doctor.          acetaminophen 325 MG tablet Commonly known as: TYLENOL Take 650 mg by mouth every 6 (six) hours as needed.   aspirin 81 MG EC tablet Take 1 tablet (81 mg total) by mouth daily. Swallow whole.   CALCIUM + D PO Take 1,200 mg by mouth.   Dermacloud Oint Apply topically 3 (three) times daily.   escitalopram 10 MG tablet Commonly known as: LEXAPRO Take 10 mg by mouth daily.   methocarbamol 500 MG tablet Commonly known as: ROBAXIN Take 250 mg by mouth every 8 (eight) hours as needed for muscle spasms.   Multivitamin Adult Chew Chew 1 tablet by mouth daily.   rosuvastatin 20 MG tablet Commonly known as: CRESTOR Take 1 tablet (20 mg total) by mouth daily.   tamsulosin 0.4 MG Caps capsule Commonly known as: FLOMAX Take 0.4 mg by mouth daily.   traMADol 50 MG tablet Commonly known as: ULTRAM Take 50 mg by mouth every 8 (eight) hours as needed.   vitamin C 1000 MG tablet Take 1,000 mg by mouth daily.   zinc oxide 20 % ointment Apply 1 application topically as needed for irritation.        Review of Systems  Constitutional:  Negative for activity change, appetite change and fever.  HENT:  Positive for hearing loss. Negative for congestion, trouble swallowing and voice change.   Eyes:  Negative for visual disturbance.  Respiratory:  Negative for cough and shortness of breath.   Gastrointestinal:  Negative for abdominal pain and  constipation.  Genitourinary:  Positive for difficulty urinating. Negative for dysuria and hematuria.       Foley cath  Musculoskeletal:  Positive for arthralgias and gait problem.  Skin:  Negative for color change.  Neurological:  Positive for speech difficulty and weakness. Negative for headaches.       Expressive aphasia.   Psychiatric/Behavioral:  Negative for behavioral problems and sleep disturbance. The patient is not nervous/anxious.    Immunization History  Administered Date(s) Administered   Influenza Split 03/06/2007, 02/16/2008, 03/06/2015, 01/19/2017   Influenza, High Dose Seasonal PF 01/19/2017, 02/06/2021   Influenza,inj,Quad PF,6+ Mos 01/19/2016, 01/20/2018, 02/10/2019, 02/04/2020   PFIZER(Purple Top)SARS-COV-2 Vaccination 07/11/2019, 07/31/2019   Pneumococcal Conjugate-13 05/13/2016   Pneumococcal Polysaccharide-23 03/06/2007  Pertinent  Health Maintenance Due  Topic Date Due   INFLUENZA VACCINE  Completed   DEXA SCAN  Completed   Fall Risk 03/10/2021 03/11/2021 03/12/2021 03/12/2021 03/13/2021  Patient Fall Risk Level High fall risk High fall risk High fall risk High fall risk High fall risk   Functional Status Survey:    Vitals:   05/01/21 1446  BP: 104/62  Pulse: 100  Resp: 18  Temp: (!) 97.5 F (36.4 C)  SpO2: 95%  Weight: 135 lb (61.2 kg)  Height: 5\' 7"  (1.702 m)   Body mass index is 21.14 kg/m. Physical Exam Vitals reviewed.  Constitutional:      Appearance: Normal appearance.  HENT:     Head: Normocephalic and atraumatic.     Nose: Nose normal.  Eyes:     Extraocular Movements: Extraocular movements intact.     Conjunctiva/sclera: Conjunctivae normal.     Pupils: Pupils are equal, round, and reactive to light.  Cardiovascular:     Rate and Rhythm: Normal rate and regular rhythm.     Heart sounds: Murmur heard.     Comments: L DP pulse present. R DP pulse absent.  Pulmonary:     Effort: Pulmonary effort is normal.     Breath sounds:  No wheezing or rales.  Abdominal:     Palpations: Abdomen is soft.     Tenderness: There is no abdominal tenderness.  Musculoskeletal:     Cervical back: Normal range of motion and neck supple.     Right lower leg: Edema present.     Left lower leg: No edema.     Comments: RUE/RLE mild swelling.   Skin:    General: Skin is warm and dry.  Neurological:     General: No focal deficit present.     Mental Status: She is alert. Mental status is at baseline.     Cranial Nerves: Cranial nerve deficit present.     Motor: Weakness present.     Gait: Gait abnormal.     Comments: To person, place. RUE/ULE muscle strength 1/5  Psychiatric:        Mood and Affect: Mood normal.        Behavior: Behavior normal.    Labs reviewed: Recent Labs    02/25/21 0300 02/26/21 0121 03/11/21 0842 03/11/21 0901 03/12/21 0338 03/13/21 0207  NA 134*   < > 132*  --  133* 134*  K 3.7   < > 4.1  --  4.1 4.2  CL 103   < > 100  --  103 103  CO2 20*   < > 25  --  23 25  GLUCOSE 98   < > 120*  --  123* 119*  BUN 26*   < > 10  --  15 13  CREATININE 0.79   < > 0.56  --  0.57 0.56  CALCIUM 8.5*   < > 8.5*  --  8.2* 8.2*  MG 2.1   < >  --  2.0 2.0 2.0  PHOS 3.2  --   --   --   --   --    < > = values in this interval not displayed.   Recent Labs    03/10/21 0330 03/12/21 0338 03/13/21 0207  AST 21 20 24   ALT 22 20 25   ALKPHOS 52 44 49  BILITOT 0.8 0.6 0.5  PROT 5.3* 5.1* 5.1*  ALBUMIN 2.4* 2.2* 2.1*   Recent Labs    03/10/21 0330 03/11/21  2956 03/12/21 0338 03/13/21 0207  WBC 14.9* 11.1* 10.7* 9.2  NEUTROABS 11.5*  --  7.4 5.8  HGB 11.5* 11.0* 10.2* 9.7*  HCT 34.4* 32.7* 31.4* 30.2*  MCV 83.3 83.6 85.6 85.1  PLT 435* 428* 411* 435*   Lab Results  Component Value Date   TSH 2.867 03/05/2021   Lab Results  Component Value Date   HGBA1C 5.6 03/06/2021   Lab Results  Component Value Date   CHOL 199 03/06/2021   HDL 46 03/06/2021   LDLCALC 141 (H) 03/06/2021   TRIG 62 03/06/2021    CHOLHDL 4.3 03/06/2021    Significant Diagnostic Results in last 30 days:  No results found.  Assessment/Plan Mid back pain  Continue prn Tylenol, resume Tramadol x 1 week, continue prn Robaxin  Essential hypertension Blood pressure is controlled, on Amlodipine, Bun/creat 13/0.56 03/13/21  Hyperlipidemia Continue Crestor, LDL 141 03/13/21, pending lipid panel.   Acute urinary retention Foley, Flomax, f/u Urology  Recurrent depression ([place]) His mood is stable, continue Lexapro.   Dysphagia Continue diet modification, mechanical soft   Nonrheumatic aortic valve stenosis , f/u Cardiology, normal EF in the past. BNP 266 03/13/21  CVA (cerebral vascular accident) ([place]) MCV stroke with right hemiparesis, mechanical lift for transfer, on ASA  Normocytic normochromic anemia Hgb 9.7 03/13/21, Vit B12 807 03/11/21, pending CBC  Hyponatremia Na 134, pending CMP    Family/ staff Communication: plan of care reviewed with the patient and charge nurse.   Labs/tests ordered: none  Time spend 35 minutes.

## 2021-05-01 NOTE — Assessment & Plan Note (Signed)
Hgb 9.7 03/13/21, Vit B12 807 03/11/21, pending CBC

## 2021-05-01 NOTE — Assessment & Plan Note (Signed)
Foley, Flomax, f/u Urology

## 2021-05-01 NOTE — Assessment & Plan Note (Signed)
Blood pressure is controlled, on Amlodipine, Bun/creat 13/0.56 03/13/21

## 2021-05-01 NOTE — Assessment & Plan Note (Signed)
His mood is stable, continue Lexapro.

## 2021-05-01 NOTE — Assessment & Plan Note (Signed)
,   f/u Cardiology, normal EF in the past. BNP 266 03/13/21

## 2021-05-01 NOTE — Assessment & Plan Note (Signed)
Continue prn Tylenol, resume Tramadol x 1 week, continue prn Robaxin

## 2021-05-01 NOTE — Assessment & Plan Note (Addendum)
Continue Crestor, LDL 141 03/13/21, pending lipid panel.

## 2021-05-02 ENCOUNTER — Other Ambulatory Visit: Payer: Self-pay | Admitting: Orthopedic Surgery

## 2021-05-02 DIAGNOSIS — M549 Dorsalgia, unspecified: Secondary | ICD-10-CM

## 2021-05-02 MED ORDER — TRAMADOL HCL 50 MG PO TABS: 50.0000 mg | ORAL_TABLET | Freq: Three times a day (TID) | ORAL | 0 refills | Status: DC | PRN

## 2021-05-03 ENCOUNTER — Encounter: Payer: Self-pay | Admitting: Nurse Practitioner

## 2021-05-03 DIAGNOSIS — E785 Hyperlipidemia, unspecified: Secondary | ICD-10-CM | POA: Diagnosis not present

## 2021-05-03 DIAGNOSIS — D649 Anemia, unspecified: Secondary | ICD-10-CM | POA: Diagnosis not present

## 2021-05-03 LAB — LIPID PANEL
Cholesterol: 100 (ref 0–200)
HDL: 31 — AB (ref 35–70)
LDL Cholesterol: 49
Triglycerides: 116 (ref 40–160)

## 2021-05-03 LAB — BASIC METABOLIC PANEL
BUN: 10 (ref 4–21)
CO2: 27 — AB (ref 13–22)
Chloride: 103 (ref 99–108)
Creatinine: 0.4 — AB (ref 0.5–1.1)
Glucose: 98
Potassium: 4.3 (ref 3.4–5.3)
Sodium: 136 — AB (ref 137–147)

## 2021-05-03 LAB — CBC: RBC: 4.02 (ref 3.87–5.11)

## 2021-05-03 LAB — CBC AND DIFFERENTIAL
HCT: 33 — AB (ref 36–46)
Hemoglobin: 10.5 — AB (ref 12.0–16.0)
Platelets: 658 — AB (ref 150–399)
WBC: 11.4

## 2021-05-03 LAB — HEPATIC FUNCTION PANEL
ALT: 15 (ref 7–35)
AST: 18 (ref 13–35)
Alkaline Phosphatase: 96 (ref 25–125)
Bilirubin, Total: 0.3

## 2021-05-03 LAB — COMPREHENSIVE METABOLIC PANEL
Albumin: 2.3 — AB (ref 3.5–5.0)
Calcium: 8 — AB (ref 8.7–10.7)

## 2021-05-08 ENCOUNTER — Other Ambulatory Visit: Payer: Self-pay | Admitting: Physician Assistant

## 2021-05-08 DIAGNOSIS — I639 Cerebral infarction, unspecified: Secondary | ICD-10-CM

## 2021-05-08 DIAGNOSIS — I4891 Unspecified atrial fibrillation: Secondary | ICD-10-CM

## 2021-05-10 ENCOUNTER — Telehealth: Payer: Self-pay | Admitting: *Deleted

## 2021-05-10 NOTE — Telephone Encounter (Signed)
Niece returned call and I advised the patient's recent cardiac monitor did not show evidence of atrial fibrillation. She confirmed hospital FU here, verbalized understanding, appreciation.

## 2021-05-10 NOTE — Telephone Encounter (Signed)
Called niece, on DPR and LVM requesting call back.

## 2021-05-10 NOTE — Telephone Encounter (Signed)
If they are going to be seen here ok to call

## 2021-05-11 ENCOUNTER — Other Ambulatory Visit: Payer: Self-pay

## 2021-05-11 ENCOUNTER — Non-Acute Institutional Stay (SKILLED_NURSING_FACILITY): Payer: Medicare Other | Admitting: Orthopedic Surgery

## 2021-05-11 ENCOUNTER — Encounter: Payer: Self-pay | Admitting: Orthopedic Surgery

## 2021-05-11 DIAGNOSIS — R338 Other retention of urine: Secondary | ICD-10-CM

## 2021-05-11 DIAGNOSIS — M549 Dorsalgia, unspecified: Secondary | ICD-10-CM

## 2021-05-11 DIAGNOSIS — F339 Major depressive disorder, recurrent, unspecified: Secondary | ICD-10-CM | POA: Diagnosis not present

## 2021-05-11 DIAGNOSIS — I350 Nonrheumatic aortic (valve) stenosis: Secondary | ICD-10-CM | POA: Diagnosis not present

## 2021-05-11 DIAGNOSIS — R1310 Dysphagia, unspecified: Secondary | ICD-10-CM

## 2021-05-11 DIAGNOSIS — I10 Essential (primary) hypertension: Secondary | ICD-10-CM | POA: Diagnosis not present

## 2021-05-11 DIAGNOSIS — E782 Mixed hyperlipidemia: Secondary | ICD-10-CM

## 2021-05-11 DIAGNOSIS — G8191 Hemiplegia, unspecified affecting right dominant side: Secondary | ICD-10-CM | POA: Diagnosis not present

## 2021-05-11 MED ORDER — TRAMADOL HCL 50 MG PO TABS: 50.0000 mg | ORAL_TABLET | Freq: Three times a day (TID) | ORAL | 0 refills | Status: AC | PRN

## 2021-05-11 NOTE — Progress Notes (Signed)
Location:  [place] Room Number: 28 Place of Service:  SNF [phone]) Provider:  Windell Moulding, AGNP-C  Virgie Dad, MD  Patient Care Team: Virgie Dad, MD as PCP - General (Internal Medicine) Werner Lean, MD as PCP - Cardiology (Cardiology) Werner Lean, MD as Consulting Physician (Cardiology)  Extended Emergency Contact Information Primary Emergency Contact: [name],[name] Address: [place]          [place] [address] [name] of [place] Phone: [phone] Mobile Phone: [phone] Relation: Relative Secondary Emergency Contact: [place] Mobile Phone: [phone] Relation: Niece Preferred language: English Interpreter needed? No  Code Status:  DNR Goals of care: Advanced Directive information Advanced Directives 05/01/2021  Does Patient Have a Medical Advance Directive? Yes  Type of Paramedic of [place];Living will  Does patient want to make changes to medical advance directive? No - Patient declined  Copy of [place] in Chart? Yes - validated most recent copy scanned in chart (See row information)  Would patient like information on creating a medical advance directive? -     Chief Complaint  Patient presents with   Acute Visit    Increased back pain    HPI:  Pt is a 88 y.o. female seen today for acute visit due to increased back pain.   She currently resides on the skilled nursing unit at [facility]. PMH: CVA, HTN, aortic stenosis, RBBB, recent rhabdomyolysis, dysphagia, anemia, back pain, falls, and depression.   Nursing reports increased back pain with turning and PT sessions. Initially on robaxin prn, Tramadol prn was added 01/03. She is a poor historian due to aphasia. History of compression fractures to T7, L1 and L2 and degenerative changes.   Right sided hemiparesis- hospitalized 11/07-11/15, MRI brain noted acute ischemic nonhemorrhagic  left MCA infarct involving posterior frontoparietal region, hoyer lift at this time, off anticoagulation due to falls, she is on aspirin, recent cardiac monitor did not detect atrial fibrillation HTN- BUN/creat 10/0.43 05/03/2021, not on medication HLD- LDL 49 05/03/2020, remains on rosuvastatin Aortic stenosis- LV EF 60-65% 01/2021, followed by cardiology Urinary retention- foley cath in place, followed by urology, remains on Flomax Dysphagia- no recent aspirations, remains on mechanical soft diet Depression- no mood changes, remains on Lexapro  Past Medical History:  Diagnosis Date   Allergies    BCC (basal cell carcinoma), arm 2008   Left    Elevated cholesterol    Estrogen deficiency    Fracture    in back   Hx of scarlet fever    Hypercholesteremia    Hyponatremia    Insomnia    Low sodium levels    Melanoma in situ ([place]) 2009   Mitral valve prolapse    Murmur    Psychosocial problem    Thrombocytosis    Past Surgical History:  Procedure Laterality Date   BALLOON DILATION N/A 05/11/2015   Procedure: BALLOON DILATION;  Surgeon: Carol Ada, MD;  Location: [facility] ENDOSCOPY;  Service: Endoscopy;  Laterality: N/A;   DILATION AND CURETTAGE OF UTERUS  05/1984   Prolif   DILATION AND CURETTAGE OF UTERUS  06/1986   benign   ESOPHAGOGASTRODUODENOSCOPY Left 05/11/2015   Procedure: ESOPHAGOGASTRODUODENOSCOPY (EGD);  Surgeon: Carol Ada, MD;  Location: [facility] ENDOSCOPY;  Service: Endoscopy;  Laterality: Left;   EYE SURGERY     bilat cataract   TONSILLECTOMY     TOTAL ABDOMINAL HYSTERECTOMY W/ BILATERAL SALPINGOOPHORECTOMY  05/1987   Leiomyoma  Allergies  Allergen Reactions   Novocain [Procaine] Shortness Of Breath and Other (See Comments)    "Deathly allergic to this"   Codeine Other (See Comments)    Reaction not recalled by the patient- stated it was "a long time ago"   Pneumococcal Vaccines Other (See Comments)    Reaction not recalled by the patient- stated it was "a long  time ago"   Pseudoephedrine Other (See Comments)    Reaction not recalled by the patient- stated it was "a long time ago"   Tetanus Toxoids Swelling and Other (See Comments)    Swelling at injecton site   Tetanus-Diphtheria Toxoids Td Swelling and Other (See Comments)    Swelling where injected    Outpatient Encounter Medications as of 05/11/2021  Medication Sig   acetaminophen (TYLENOL) 325 MG tablet Take 650 mg by mouth every 6 (six) hours as needed.   Ascorbic Acid (VITAMIN C) 1000 MG tablet Take 1,000 mg by mouth daily.   aspirin EC 81 MG EC tablet Take 1 tablet (81 mg total) by mouth daily. Swallow whole.   Calcium Carbonate-Vitamin D (CALCIUM + D PO) Take 1,200 mg by mouth.   escitalopram (LEXAPRO) 10 MG tablet Take 10 mg by mouth daily.   Infant Care Products [facility]) OINT Apply topically 3 (three) times daily.   methocarbamol (ROBAXIN) 500 MG tablet Take 250 mg by mouth every 8 (eight) hours as needed for muscle spasms.   Multiple Vitamins-Minerals (MULTIVITAMIN ADULT) CHEW Chew 1 tablet by mouth daily.   rosuvastatin (CRESTOR) 20 MG tablet Take 1 tablet (20 mg total) by mouth daily.   tamsulosin (FLOMAX) 0.4 MG CAPS capsule Take 0.4 mg by mouth daily.   traMADol (ULTRAM) 50 MG tablet Take 1 tablet (50 mg total) by mouth every 8 (eight) hours as needed for up to 14 days.   zinc oxide 20 % ointment Apply 1 application topically as needed for irritation.   No facility-administered encounter medications on file as of 05/11/2021.    Review of Systems  Unable to perform ROS: Other Aphasia  Immunization History  Administered Date(s) Administered   Influenza Split 03/06/2007, 02/16/2008, 03/06/2015, 01/19/2017   Influenza, High Dose Seasonal PF 01/19/2017, 02/06/2021   Influenza,inj,Quad PF,6+ Mos 01/19/2016, 01/20/2018, 02/10/2019, 02/04/2020   PFIZER(Purple Top)SARS-COV-2 Vaccination 07/11/2019, 07/31/2019   Pneumococcal Conjugate-13 05/13/2016   Pneumococcal  Polysaccharide-23 03/06/2007   Pertinent  Health Maintenance Due  Topic Date Due   INFLUENZA VACCINE  Completed   DEXA SCAN  Completed   Fall Risk 03/10/2021 03/11/2021 03/12/2021 03/12/2021 03/13/2021  Patient Fall Risk Level High fall risk High fall risk High fall risk High fall risk High fall risk   Functional Status Survey:    Vitals:   05/11/21 1236  BP: (!) 127/58  Pulse: 98  Resp: 16  Temp: (!) 97.5 F (36.4 C)  SpO2: 98%  Weight: 133 lb 6.4 oz (60.5 kg)  Height: 5\' 7"  (1.702 m)   Body mass index is 20.89 kg/m. Physical Exam Vitals reviewed.  Constitutional:      General: She is not in acute distress. HENT:     Head: Normocephalic.  Eyes:     General:        Right eye: No discharge.        Left eye: No discharge.  Neck:     Vascular: No carotid bruit.  Cardiovascular:     Rate and Rhythm: Normal rate and regular rhythm.     Pulses: Normal pulses.  Heart sounds: Murmur heard.  Pulmonary:     Effort: Pulmonary effort is normal. No respiratory distress.     Breath sounds: Normal breath sounds. No wheezing.  Abdominal:     General: Bowel sounds are normal. There is no distension.     Palpations: Abdomen is soft.     Tenderness: There is no abdominal tenderness.  Musculoskeletal:     Cervical back: Normal and normal range of motion.     Thoracic back: No swelling, deformity or tenderness. Decreased range of motion.     Lumbar back: No swelling, deformity or tenderness. Decreased range of motion.     Right lower leg: Edema present.     Left lower leg: Edema present.     Comments: Non pitting  Lymphadenopathy:     Cervical: No cervical adenopathy.  Skin:    General: Skin is warm and dry.     Capillary Refill: Capillary refill takes less than 2 seconds.  Neurological:     General: No focal deficit present.     Mental Status: She is alert. Mental status is at baseline.     Motor: Weakness present.     Gait: Gait abnormal.     Comments: Hoyer, right  sided weakness 1/5 and facial droop  Psychiatric:        Mood and Affect: Mood normal.        Behavior: Behavior normal.        Cognition and Memory: Memory is not impaired.     Comments: Aphasia    Labs reviewed: Recent Labs    02/25/21 0300 02/26/21 0121 03/11/21 0842 03/11/21 0901 03/12/21 0338 03/13/21 0207  NA 134*   < > 132*  --  133* 134*  K 3.7   < > 4.1  --  4.1 4.2  CL 103   < > 100  --  103 103  CO2 20*   < > 25  --  23 25  GLUCOSE 98   < > 120*  --  123* 119*  BUN 26*   < > 10  --  15 13  CREATININE 0.79   < > 0.56  --  0.57 0.56  CALCIUM 8.5*   < > 8.5*  --  8.2* 8.2*  MG 2.1   < >  --  2.0 2.0 2.0  PHOS 3.2  --   --   --   --   --    < > = values in this interval not displayed.   Recent Labs    03/10/21 0330 03/12/21 0338 03/13/21 0207  AST 21 20 24   ALT 22 20 25   ALKPHOS 52 44 49  BILITOT 0.8 0.6 0.5  PROT 5.3* 5.1* 5.1*  ALBUMIN 2.4* 2.2* 2.1*   Recent Labs    03/10/21 0330 03/11/21 0842 03/12/21 0338 03/13/21 0207  WBC 14.9* 11.1* 10.7* 9.2  NEUTROABS 11.5*  --  7.4 5.8  HGB 11.5* 11.0* 10.2* 9.7*  HCT 34.4* 32.7* 31.4* 30.2*  MCV 83.3 83.6 85.6 85.1  PLT 435* 428* 411* 435*   Lab Results  Component Value Date   TSH 2.867 03/05/2021   Lab Results  Component Value Date   HGBA1C 5.6 03/06/2021   Lab Results  Component Value Date   CHOL 199 03/06/2021   HDL 46 03/06/2021   LDLCALC 141 (H) 03/06/2021   TRIG 62 03/06/2021   CHOLHDL 4.3 03/06/2021    Significant Diagnostic Results in last 30 days:  Cardiac event monitor  Result Date: 05/10/2021  Patient had a minimum heat rate of 74 bpm, maximum heart rate of 126 bpm, and average heart rate of 95 bpm.  Predominant underlying rhythm was sinus rhythm.  Isolated PACs were occasional (1.0%).  Isolated PVCs were occasional (2.0%).  No evidence of atrial fibrillation.  Triggered and diary events associated with sinus rhythm.  No malignant arrhythmias.   Assessment/Plan 1. Mid  back pain - pain increased with movement- ADLs/PT - hx compression fracture to T7, L1 and L2 - start tramadol 50 mg po qam - cont robaxin and tramadol prn  2. Right hemiparesis ([place]) - suspected PAF- recent cardiac monitor did not show atrial fib - cont skilled nursing - cont PT - cont aspirin  3. Essential hypertension - controlled without medication  4. Mixed hyperlipidemia - LDL 49 05/03/2021 - cont rosuvastatin  5. Nonrheumatic aortic valve stenosis - followed by cardiology - EF 60-65%  6. Acute urinary retention - cont foley - followed by urology - cont Flomax  7. Dysphagia, unspecified type - no recent aspirations - cont mechanical soft diet  8. Recurrent depression ([place]) - no mood changes - cont Lexapro   Family/ staff Communication: plan discussed with patient and nurse  Labs/tests ordered:  none

## 2021-05-11 NOTE — Telephone Encounter (Signed)
Refill request received from [facility] for Tramadol 50 mg. Take one tablet every 8 hours as needed for up to 14 days Medication was filled 05/02/2021.  Medication pended and sent to Windell Moulding, NP for approval/refusal

## 2021-05-13 MED ORDER — TRAMADOL HCL 50 MG PO TABS: 50.0000 mg | ORAL_TABLET | Freq: Every morning | ORAL | 0 refills | Status: AC

## 2021-05-23 ENCOUNTER — Non-Acute Institutional Stay (SKILLED_NURSING_FACILITY): Payer: Medicare Other | Admitting: Orthopedic Surgery

## 2021-05-23 ENCOUNTER — Encounter: Payer: Self-pay | Admitting: Orthopedic Surgery

## 2021-05-23 DIAGNOSIS — D649 Anemia, unspecified: Secondary | ICD-10-CM | POA: Diagnosis not present

## 2021-05-23 DIAGNOSIS — R5383 Other fatigue: Secondary | ICD-10-CM

## 2021-05-23 DIAGNOSIS — R338 Other retention of urine: Secondary | ICD-10-CM | POA: Diagnosis not present

## 2021-05-23 DIAGNOSIS — I00 Rheumatic fever without heart involvement: Secondary | ICD-10-CM | POA: Diagnosis not present

## 2021-05-23 DIAGNOSIS — M549 Dorsalgia, unspecified: Secondary | ICD-10-CM

## 2021-05-23 DIAGNOSIS — I350 Nonrheumatic aortic (valve) stenosis: Secondary | ICD-10-CM | POA: Diagnosis not present

## 2021-05-23 DIAGNOSIS — F339 Major depressive disorder, recurrent, unspecified: Secondary | ICD-10-CM

## 2021-05-23 DIAGNOSIS — I10 Essential (primary) hypertension: Secondary | ICD-10-CM | POA: Diagnosis not present

## 2021-05-23 DIAGNOSIS — D72829 Elevated white blood cell count, unspecified: Secondary | ICD-10-CM | POA: Diagnosis not present

## 2021-05-23 DIAGNOSIS — E782 Mixed hyperlipidemia: Secondary | ICD-10-CM

## 2021-05-23 DIAGNOSIS — R1310 Dysphagia, unspecified: Secondary | ICD-10-CM

## 2021-05-23 DIAGNOSIS — G8191 Hemiplegia, unspecified affecting right dominant side: Secondary | ICD-10-CM | POA: Diagnosis not present

## 2021-05-23 NOTE — Progress Notes (Signed)
Location:   [facility] Room Number: [id number] Place of Service:  SNF [phone]) Provider:  Windell Moulding, NP  Virgie Dad, MD  Patient Care Team: Virgie Dad, MD as PCP - General (Internal Medicine) Werner Lean, MD as PCP - Cardiology (Cardiology) Werner Lean, MD as Consulting Physician (Cardiology)  Extended Emergency Contact Information Primary Emergency Contact: [name],[name] Address: [address]          [place] [address] [name] of [place] Phone: [phone] Mobile Phone: [phone] Relation: Relative Secondary Emergency Contact: [place] Mobile Phone: [phone] Relation: Niece Preferred language: English Interpreter needed? No  Code Status:  DNR Goals of care: Advanced Directive information Advanced Directives 05/23/2021  Does Patient Have a Medical Advance Directive? Yes  Type of Paramedic of [place];Living will  Does patient want to make changes to medical advance directive? No - Patient declined  Copy of [place] in Chart? Yes - validated most recent copy scanned in chart (See row information)  Would patient like information on creating a medical advance directive? -     Chief Complaint  Patient presents with   Medical Management of Chronic Issues    Routine follow up visit.   Health Maintenance    Discuss need for tetanus/tdap vaccine,shingrix, COVID booster    HPI:  Pt is a 88 y.o. female seen today for medical management of chronic diseases.    She currently resides on the skilled nursing unit at [facility]. PMH: CVA, HTN, aortic stenosis, RBBB, recent rhabdomyolysis, dysphagia, anemia, back pain, falls, and depression.   Somnolence- nursing staff reports increased sleepiness in the past 24 hours, poor po intake and UOP, she was able to briefly open her eyes and squeeze her left hand, also grimaces to painful stimuli, admitted to skilled  nursing 03/2021 she has not progressed with PT due to pain, discussed treatment options with HPOA- goal to avoid hospitalizations- requesting hospice consult Back pain- increased pain with ADLs and PT, Tramadol 50 mg qam started 01/23, remains on robaxin and tramadol prn Right sided hemiparesis- hospitalized 11/07-11/15, MRI brain noted acute ischemic nonhemorrhagic left MCA infarct involving posterior frontoparietal region, hoyer lift at this time, off anticoagulation due to falls, she is on aspirin, recent cardiac monitor did not detect atrial fibrillation HTN- BUN/creat 10/0.43 05/03/2021, not on medication HLD- LDL 49 05/03/2020, remains on rosuvastatin Aortic stenosis- LV EF 60-65% 01/2021, followed by cardiology Urinary retention- foley cath in place, 950 cc collected this morning, urine dark in color with odor, followed by urology, remains on Flomax Dysphagia- no recent aspirations, remains on mechanical soft diet Depression- no mood changes, remains on Lexapro   Past Medical History:  Diagnosis Date   Allergies    BCC (basal cell carcinoma), arm 2008   Left    Elevated cholesterol    Estrogen deficiency    Fracture    in back   Hx of scarlet fever    Hypercholesteremia    Hyponatremia    Insomnia    Low sodium levels    Melanoma in situ ([place]) 2009   Mitral valve prolapse    Murmur    Psychosocial problem    Thrombocytosis    Past Surgical History:  Procedure Laterality Date   BALLOON DILATION N/A 05/11/2015   Procedure: BALLOON DILATION;  Surgeon: Carol Ada, MD;  Location: [facility] ENDOSCOPY;  Service: Endoscopy;  Laterality: N/A;   DILATION AND CURETTAGE OF UTERUS  05/1984  Prolif   DILATION AND CURETTAGE OF UTERUS  06/1986   benign   ESOPHAGOGASTRODUODENOSCOPY Left 05/11/2015   Procedure: ESOPHAGOGASTRODUODENOSCOPY (EGD);  Surgeon: Carol Ada, MD;  Location: [facility] ENDOSCOPY;  Service: Endoscopy;  Laterality: Left;   EYE SURGERY     bilat cataract   TONSILLECTOMY      TOTAL ABDOMINAL HYSTERECTOMY W/ BILATERAL SALPINGOOPHORECTOMY  05/1987   Leiomyoma    Allergies  Allergen Reactions   Novocain [Procaine] Shortness Of Breath and Other (See Comments)    "Deathly allergic to this"   Codeine Other (See Comments)    Reaction not recalled by the patient- stated it was "a long time ago"   Pneumococcal Vaccines Other (See Comments)    Reaction not recalled by the patient- stated it was "a long time ago"   Pseudoephedrine Other (See Comments)    Reaction not recalled by the patient- stated it was "a long time ago"   Tetanus Toxoids Swelling and Other (See Comments)    Swelling at injecton site   Tetanus-Diphtheria Toxoids Td Swelling and Other (See Comments)    Swelling where injected    Allergies as of 05/23/2021       Reactions   Novocain [procaine] Shortness Of Breath, Other (See Comments)   "Deathly allergic to this"   Codeine Other (See Comments)   Reaction not recalled by the patient- stated it was "a long time ago"   Pneumococcal Vaccines Other (See Comments)   Reaction not recalled by the patient- stated it was "a long time ago"   Pseudoephedrine Other (See Comments)   Reaction not recalled by the patient- stated it was "a long time ago"   Tetanus Toxoids Swelling, Other (See Comments)   Swelling at injecton site   Tetanus-diphtheria Toxoids Td Swelling, Other (See Comments)   Swelling where injected        Medication List        Accurate as of May 23, 2021 11:58 AM. If you have any questions, ask your nurse or doctor.          STOP taking these medications    [place] Oint Stopped by: Yvonna Alanis, NP       TAKE these medications    acetaminophen 325 MG tablet Commonly known as: TYLENOL Take 650 mg by mouth every 6 (six) hours as needed.   aspirin 81 MG EC tablet Take 1 tablet (81 mg total) by mouth daily. Swallow whole.   CALCIUM + D PO Take 1,200 mg by mouth.   escitalopram 10 MG tablet Commonly known as:  LEXAPRO Take 10 mg by mouth daily.   methocarbamol 500 MG tablet Commonly known as: ROBAXIN Take 250 mg by mouth every 8 (eight) hours as needed for muscle spasms.   Multivitamin Adult Chew Chew 1 tablet by mouth daily.   NON FORMULARY med pass 2.0 liquid; 2.0; amt: 120 ml; oral   PRO-STAT PO Take 30 mLs by mouth in the morning and at bedtime.   rosuvastatin 20 MG tablet Commonly known as: CRESTOR Take 1 tablet (20 mg total) by mouth daily.   tamsulosin 0.4 MG Caps capsule Commonly known as: FLOMAX Take 0.4 mg by mouth daily.   traMADol 50 MG tablet Commonly known as: ULTRAM Take 1 tablet (50 mg total) by mouth every 8 (eight) hours as needed for up to 14 days.   vitamin C 500 MG tablet Commonly known as: ASCORBIC ACID Take 1,000 mg by mouth daily.   zinc oxide 20 %  ointment Apply 1 application topically as needed for irritation.        Review of Systems  Unable to perform ROS: Mental status change   Immunization History  Administered Date(s) Administered   Influenza Split 03/06/2007, 02/16/2008, 03/06/2015, 01/19/2017   Influenza, High Dose Seasonal PF 01/19/2017, 02/06/2021   Influenza,inj,Quad PF,6+ Mos 01/19/2016, 01/20/2018, 02/10/2019, 02/04/2020   PFIZER(Purple Top)SARS-COV-2 Vaccination 07/11/2019, 07/31/2019   Pneumococcal Conjugate-13 05/13/2016   Pneumococcal Polysaccharide-23 03/06/2007   Pertinent  Health Maintenance Due  Topic Date Due   INFLUENZA VACCINE  Completed   DEXA SCAN  Completed   Fall Risk 03/10/2021 03/11/2021 03/12/2021 03/12/2021 03/13/2021  Patient Fall Risk Level High fall risk High fall risk High fall risk High fall risk High fall risk   Functional Status Survey:    Vitals:   05/23/21 1117  BP: 137/60  Pulse: 78  Resp: 20  Temp: (!) 96.1 F (35.6 C)  SpO2: 96%  Weight: 132 lb 8 oz (60.1 kg)  Height: 5\' 7"  (1.702 m)   Body mass index is 20.75 kg/m. Physical Exam Vitals reviewed.  Constitutional:       Appearance: She is ill-appearing.  HENT:     Head: Normocephalic.  Eyes:     General:        Right eye: No discharge.        Left eye: No discharge.     Pupils: Pupils are equal, round, and reactive to light.  Cardiovascular:     Rate and Rhythm: Regular rhythm. Tachycardia present.     Pulses: Normal pulses.     Heart sounds: Normal heart sounds.  Pulmonary:     Effort: Pulmonary effort is normal. No respiratory distress.     Breath sounds: Normal breath sounds. No wheezing.  Abdominal:     General: Bowel sounds are normal. There is no distension.     Palpations: Abdomen is soft.     Tenderness: There is no abdominal tenderness.  Genitourinary:    Comments: foley Musculoskeletal:     Cervical back: Neck supple.     Right lower leg: No edema.     Left lower leg: No edema.  Skin:    General: Skin is warm and dry.     Capillary Refill: Capillary refill takes less than 2 seconds.     Comments: Left heel with quarter sized blister, skin dry, fluid sac resolved  Neurological:     General: No focal deficit present.     Mental Status: She is lethargic.     Motor: Weakness present.     Gait: Gait abnormal.     Comments: Bed bound  Psychiatric:        Attention and Perception: She is inattentive.    Labs reviewed: Recent Labs    02/25/21 0300 02/26/21 0121 03/11/21 0842 03/11/21 0901 03/12/21 0338 03/13/21 0207 05/03/21 0000  NA 134*   < > 132*  --  133* 134* 136*  K 3.7   < > 4.1  --  4.1 4.2 4.3  CL 103   < > 100  --  103 103 103  CO2 20*   < > 25  --  23 25 27*  GLUCOSE 98   < > 120*  --  123* 119*  --   BUN 26*   < > 10  --  15 13 10   CREATININE 0.79   < > 0.56  --  0.57 0.56 0.4*  CALCIUM 8.5*   < > 8.5*  --  8.2* 8.2* 8.0*  MG 2.1   < >  --  2.0 2.0 2.0  --   PHOS 3.2  --   --   --   --   --   --    < > = values in this interval not displayed.   Recent Labs    03/10/21 0330 03/12/21 0338 03/13/21 0207 05/03/21 0000  AST 21 20 24 18   ALT 22 20 25 15    ALKPHOS 52 44 49 96  BILITOT 0.8 0.6 0.5  --   PROT 5.3* 5.1* 5.1*  --   ALBUMIN 2.4* 2.2* 2.1* 2.3*   Recent Labs    03/10/21 0330 03/11/21 0842 03/12/21 0338 03/13/21 0207 05/03/21 0000  WBC 14.9* 11.1* 10.7* 9.2 11.4  NEUTROABS 11.5*  --  7.4 5.8  --   HGB 11.5* 11.0* 10.2* 9.7* 10.5*  HCT 34.4* 32.7* 31.4* 30.2* 33*  MCV 83.3 83.6 85.6 85.1  --   PLT 435* 428* 411* 435* 658*   Lab Results  Component Value Date   TSH 2.867 03/05/2021   Lab Results  Component Value Date   HGBA1C 5.6 03/06/2021   Lab Results  Component Value Date   CHOL 100 05/03/2021   HDL 31 (A) 05/03/2021   LDLCALC 49 05/03/2021   TRIG 116 05/03/2021   CHOLHDL 4.3 03/06/2021    Significant Diagnostic Results in last 30 days:  Cardiac event monitor  Result Date: 05/10/2021  Patient had a minimum heat rate of 74 bpm, maximum heart rate of 126 bpm, and average heart rate of 95 bpm.  Predominant underlying rhythm was sinus rhythm.  Isolated PACs were occasional (1.0%).  Isolated PVCs were occasional (2.0%).  No evidence of atrial fibrillation.  Triggered and diary events associated with sinus rhythm.  No malignant arrhythmias.   Assessment/Plan 1. Lethargic - grimaces to painful stimuli - tachycardia, poor po intake, low UOP - suspect UTI/dehydration - HPOA- avoid hospitalizations - stat cbc/diff, cmp- WBC 28.3, BUN/creat 69/1.89 - UA/culture- pending - start IV - NS 0.9%- 1 liter @ 75 cc/hr x 2 bags - Rocephin 1g IM x 2 days - discontinue Tramadol - hospice consulted   2. Mid back pain - ongoing - not progressing with PT due to pain  3. Right hemiparesis ([place]) - cont skilled nursing care - cont aspirin  4. Essential hypertension - controlled without medication  5. Mixed hyperlipidemia - cont statin  6. Nonrheumatic aortic valve stenosis - followed by cardiology  LVEF 60-65%  7. Acute urinary retention - has foley- increased risk for infection - urine dark with  odor  8. Dysphagia, unspecified type - lung sounds clear  - cont mechanical soft diet  9. Recurrent depression ([place]) - cont Lexapro    Family/ staff Communication: plan discussed with patient  Labs/tests ordered:  stat cbc/diff, cmp, UA/culture

## 2021-05-24 ENCOUNTER — Encounter: Payer: Self-pay | Admitting: Internal Medicine

## 2021-05-24 ENCOUNTER — Non-Acute Institutional Stay (SKILLED_NURSING_FACILITY): Payer: Medicare Other | Admitting: Internal Medicine

## 2021-05-24 DIAGNOSIS — N289 Disorder of kidney and ureter, unspecified: Secondary | ICD-10-CM

## 2021-05-24 DIAGNOSIS — D72829 Elevated white blood cell count, unspecified: Secondary | ICD-10-CM | POA: Diagnosis not present

## 2021-05-24 DIAGNOSIS — G8191 Hemiplegia, unspecified affecting right dominant side: Secondary | ICD-10-CM

## 2021-05-24 DIAGNOSIS — R4189 Other symptoms and signs involving cognitive functions and awareness: Secondary | ICD-10-CM | POA: Diagnosis not present

## 2021-05-24 MED ORDER — MORPHINE SULFATE (CONCENTRATE) 20 MG/ML PO SOLN: 5.0000 mg | ORAL | 0 refills | Status: DC | PRN

## 2021-05-24 NOTE — Progress Notes (Signed)
Location: [employer] features editor of Service:  SNF (31)  Provider:   Code Status: DNR/Hospice Goals of Care:  Advanced Directives 05/23/2021  Does Patient Have a Medical Advance Directive? Yes  Type of Paramedic of [place];Living will  Does patient want to make changes to medical advance directive? No - Patient declined  Copy of Person in Chart? Yes - validated most recent copy scanned in chart (See row information)  Would patient like information on creating a medical advance directive? -     Chief Complaint  Patient presents with   Acute Visit    HPI: Patient is a 88 y.o. female seen today for an acute visit for Patient unresponsive  Patient has h/o  Admitted in the hospital from 02/24/21-03/01/21 after a fall with Traumatic Rhabdomyolysis Rehab in [place] Admitted again in 11/7-11/15 for Acute metabolic encephalopathy with Right sided weakness due to Acute CVA Etiology ? PAF and Moderate Atherosclerosis disease. No Anticoagulation due to Fall risk H/o Aortic stenosis Hypertension Urinary Retention with Chronic Foley HLD  Patient was found Lethargic yesterday and not taking PO any for past 2 days Labs showed White count of 28K and Bun of 69/1.89 Started on Rocephin and IV fluids by Amy Urine Culture pending Patient continues to be unresponsive today Not eating Per Therapy she was wincing when they were trying to change her  Past Medical History:  Diagnosis Date   Allergies    BCC (basal cell carcinoma), arm 2008   Left    Elevated cholesterol    Estrogen deficiency    Fracture    in back   Hx of scarlet fever    Hypercholesteremia    Hyponatremia    Insomnia    Low sodium levels    Melanoma in situ ([place]) 2009   Mitral valve prolapse    Murmur    Psychosocial problem    Thrombocytosis     Past Surgical History:  Procedure Laterality Date   BALLOON DILATION N/A 05/11/2015   Procedure: BALLOON  DILATION;  Surgeon: Carol Ada, MD;  Location: [facility];  Service: Endoscopy;  Laterality: N/A;   DILATION AND CURETTAGE OF UTERUS  05/1984   Prolif   DILATION AND CURETTAGE OF UTERUS  06/1986   benign   ESOPHAGOGASTRODUODENOSCOPY Left 05/11/2015   Procedure: ESOPHAGOGASTRODUODENOSCOPY (EGD);  Surgeon: Carol Ada, MD;  Location: [facility] ENDOSCOPY;  Service: Endoscopy;  Laterality: Left;   EYE SURGERY     bilat cataract   TONSILLECTOMY     TOTAL ABDOMINAL HYSTERECTOMY W/ BILATERAL SALPINGOOPHORECTOMY  05/1987   Leiomyoma    Allergies  Allergen Reactions   Novocain [Procaine] Shortness Of Breath and Other (See Comments)    "Deathly allergic to this"   Codeine Other (See Comments)    Reaction not recalled by the patient- stated it was "a long time ago"   Pneumococcal Vaccines Other (See Comments)    Reaction not recalled by the patient- stated it was "a long time ago"   Pseudoephedrine Other (See Comments)    Reaction not recalled by the patient- stated it was "a long time ago"   Tetanus Toxoids Swelling and Other (See Comments)    Swelling at injecton site   Tetanus-Diphtheria Toxoids Td Swelling and Other (See Comments)    Swelling where injected    Outpatient Encounter Medications as of 05/24/2021  Medication Sig   morphine (ROXANOL) 20 MG/ML concentrated solution Take 0.25 mLs (5 mg total) by mouth  every 4 (four) hours as needed for severe pain.   acetaminophen (TYLENOL) 325 MG tablet Take 650 mg by mouth every 6 (six) hours as needed.   Amino Acids-Protein Hydrolys (PRO-STAT PO) Take 30 mLs by mouth in the morning and at bedtime.   methocarbamol (ROBAXIN) 500 MG tablet Take 250 mg by mouth every 8 (eight) hours as needed for muscle spasms.   NON FORMULARY med pass 2.0 liquid; 2.0; amt: 120 ml; oral   tamsulosin (FLOMAX) 0.4 MG CAPS capsule Take 0.4 mg by mouth daily.   traMADol (ULTRAM) 50 MG tablet Take 1 tablet (50 mg total) by mouth every 8 (eight) hours as needed for up to  14 days.   zinc oxide 20 % ointment Apply 1 application topically as needed for irritation.   [DISCONTINUED] aspirin EC 81 MG EC tablet Take 1 tablet (81 mg total) by mouth daily. Swallow whole.   [DISCONTINUED] Calcium Carbonate-Vitamin D (CALCIUM + D PO) Take 1,200 mg by mouth.   [DISCONTINUED] escitalopram (LEXAPRO) 10 MG tablet Take 10 mg by mouth daily.   [DISCONTINUED] Multiple Vitamins-Minerals (MULTIVITAMIN ADULT) CHEW Chew 1 tablet by mouth daily.   [DISCONTINUED] rosuvastatin (CRESTOR) 20 MG tablet Take 1 tablet (20 mg total) by mouth daily.   [DISCONTINUED] vitamin C (ASCORBIC ACID) 500 MG tablet Take 1,000 mg by mouth daily.   No facility-administered encounter medications on file as of 05/24/2021.    Review of Systems:  Review of Systems  Unable to perform ROS: Patient unresponsive   Health Maintenance  Topic Date Due   TETANUS/TDAP  Never done   Zoster Vaccines- Shingrix (1 of 2) Never done   COVID-19 Vaccine (3 - Pfizer risk series) 08/28/2019   Pneumonia Vaccine 36+ Years old  Completed   INFLUENZA VACCINE  Completed   DEXA SCAN  Completed   HPV VACCINES  Aged Out    Physical Exam: Vitals:   05/24/21 1541  BP: 137/60  Pulse: 78  Resp: 20  Temp: 98.5 F (36.9 C)  Weight: 132 lb 8 oz (60.1 kg)   Body mass index is 20.75 kg/m. Physical Exam Vitals reviewed.  Constitutional:      Comments: Does not respond  HENT:     Head: Normocephalic.     Nose: Nose normal.     Mouth/Throat:     Mouth: Mucous membranes are moist.     Pharynx: Oropharynx is clear.  Eyes:     Pupils: Pupils are equal, round, and reactive to light.  Cardiovascular:     Rate and Rhythm: Regular rhythm. Tachycardia present.     Pulses: Normal pulses.     Heart sounds: Normal heart sounds. No murmur heard. Pulmonary:     Effort: Pulmonary effort is normal.     Breath sounds: Normal breath sounds.  Abdominal:     General: Abdomen is flat. Bowel sounds are normal.     Palpations:  Abdomen is soft.     Comments: Winces when I press in Lower are of her abdomen  Musculoskeletal:        General: No swelling.     Cervical back: Neck supple.  Skin:    General: Skin is warm.  Neurological:     Comments: Not responsive  Psychiatric:        Mood and Affect: Mood normal.        Thought Content: Thought content normal.    Labs reviewed: Basic Metabolic Panel: Recent Labs    02/25/21 0300 02/26/21 0121 03/05/21  6948 03/06/21 0258 03/11/21 5462 03/11/21 0901 03/12/21 0338 03/13/21 0207 05/03/21 0000  NA 134*   < >  --    < > 132*  --  133* 134* 136*  K 3.7   < >  --    < > 4.1  --  4.1 4.2 4.3  CL 103   < >  --    < > 100  --  103 103 103  CO2 20*   < >  --    < > 25  --  23 25 27*  GLUCOSE 98   < >  --    < > 120*  --  123* 119*  --   BUN 26*   < >  --    < > 10  --  15 13 10   CREATININE 0.79   < >  --    < > 0.56  --  0.57 0.56 0.4*  CALCIUM 8.5*   < >  --    < > 8.5*  --  8.2* 8.2* 8.0*  MG 2.1  --  1.8   < >  --  2.0 2.0 2.0  --   PHOS 3.2  --   --   --   --   --   --   --   --   TSH  --   --  2.867  --   --   --   --   --   --    < > = values in this interval not displayed.   Liver Function Tests: Recent Labs    03/10/21 0330 03/12/21 0338 03/13/21 0207 05/03/21 0000  AST 21 20 24 18   ALT 22 20 25 15   ALKPHOS 52 44 49 96  BILITOT 0.8 0.6 0.5  --   PROT 5.3* 5.1* 5.1*  --   ALBUMIN 2.4* 2.2* 2.1* 2.3*   No results for input(s): LIPASE, AMYLASE in the last 8760 hours. Recent Labs    03/11/21 1616  AMMONIA <10   CBC: Recent Labs    03/10/21 0330 03/11/21 0842 03/12/21 0338 03/13/21 0207 05/03/21 0000  WBC 14.9* 11.1* 10.7* 9.2 11.4  NEUTROABS 11.5*  --  7.4 5.8  --   HGB 11.5* 11.0* 10.2* 9.7* 10.5*  HCT 34.4* 32.7* 31.4* 30.2* 33*  MCV 83.3 83.6 85.6 85.1  --   PLT 435* 428* 411* 435* 658*   Lipid Panel: Recent Labs    03/06/21 0258 05/03/21 0000  CHOL 199 100  HDL 46 31*  LDLCALC 141* 49  TRIG 62 116  CHOLHDL 4.3  --     Lab Results  Component Value Date   HGBA1C 5.6 03/06/2021    Procedures since last visit: Cardiac event monitor  Result Date: 05/10/2021  Patient had a minimum heat rate of 74 bpm, maximum heart rate of 126 bpm, and average heart rate of 95 bpm.  Predominant underlying rhythm was sinus rhythm.  Isolated PACs were occasional (1.0%).  Isolated PVCs were occasional (2.0%).  No evidence of atrial fibrillation.  Triggered and diary events associated with sinus rhythm.  No malignant arrhythmias.   Assessment/Plan  Unresponsive with Leucocytosis and ARI Amy has talked ot family yesterday and they want her to be comfortable Will discontinue all her meds She has not responded to IV Fluids or Rocephin Ordered Roxanol  Hospice consult pending     Labs/tests ordered:  * No order type specified * Next appt:  Visit date not  found

## 2021-05-25 ENCOUNTER — Other Ambulatory Visit: Payer: Self-pay | Admitting: Orthopedic Surgery

## 2021-05-25 DIAGNOSIS — F32A Depression, unspecified: Secondary | ICD-10-CM | POA: Diagnosis not present

## 2021-05-25 DIAGNOSIS — I69318 Other symptoms and signs involving cognitive functions following cerebral infarction: Secondary | ICD-10-CM | POA: Diagnosis not present

## 2021-05-25 DIAGNOSIS — Z8616 Personal history of COVID-19: Secondary | ICD-10-CM | POA: Diagnosis not present

## 2021-05-25 DIAGNOSIS — I4510 Unspecified right bundle-branch block: Secondary | ICD-10-CM | POA: Diagnosis not present

## 2021-05-25 DIAGNOSIS — Z515 Encounter for palliative care: Secondary | ICD-10-CM

## 2021-05-25 DIAGNOSIS — R339 Retention of urine, unspecified: Secondary | ICD-10-CM | POA: Diagnosis not present

## 2021-05-25 DIAGNOSIS — I10 Essential (primary) hypertension: Secondary | ICD-10-CM | POA: Diagnosis not present

## 2021-05-25 DIAGNOSIS — I69351 Hemiplegia and hemiparesis following cerebral infarction affecting right dominant side: Secondary | ICD-10-CM | POA: Diagnosis not present

## 2021-05-25 DIAGNOSIS — L89611 Pressure ulcer of right heel, stage 1: Secondary | ICD-10-CM | POA: Diagnosis not present

## 2021-05-25 DIAGNOSIS — I700 Atherosclerosis of aorta: Secondary | ICD-10-CM | POA: Diagnosis not present

## 2021-05-25 DIAGNOSIS — Z960 Presence of urogenital implants: Secondary | ICD-10-CM | POA: Diagnosis not present

## 2021-05-25 DIAGNOSIS — M549 Dorsalgia, unspecified: Secondary | ICD-10-CM | POA: Diagnosis not present

## 2021-05-25 DIAGNOSIS — L89322 Pressure ulcer of left buttock, stage 2: Secondary | ICD-10-CM | POA: Diagnosis not present

## 2021-05-25 DIAGNOSIS — D649 Anemia, unspecified: Secondary | ICD-10-CM | POA: Diagnosis not present

## 2021-05-25 DIAGNOSIS — I69391 Dysphagia following cerebral infarction: Secondary | ICD-10-CM | POA: Diagnosis not present

## 2021-05-25 MED ORDER — MORPHINE SULFATE (CONCENTRATE) 20 MG/ML PO SOLN: 5.0000 mg | Freq: Four times a day (QID) | ORAL | 0 refills | Status: AC

## 2021-05-26 DIAGNOSIS — L89322 Pressure ulcer of left buttock, stage 2: Secondary | ICD-10-CM | POA: Diagnosis not present

## 2021-05-26 DIAGNOSIS — I69351 Hemiplegia and hemiparesis following cerebral infarction affecting right dominant side: Secondary | ICD-10-CM | POA: Diagnosis not present

## 2021-05-26 DIAGNOSIS — I10 Essential (primary) hypertension: Secondary | ICD-10-CM | POA: Diagnosis not present

## 2021-05-26 DIAGNOSIS — I69318 Other symptoms and signs involving cognitive functions following cerebral infarction: Secondary | ICD-10-CM | POA: Diagnosis not present

## 2021-05-26 DIAGNOSIS — I69391 Dysphagia following cerebral infarction: Secondary | ICD-10-CM | POA: Diagnosis not present

## 2021-05-26 DIAGNOSIS — Z8616 Personal history of COVID-19: Secondary | ICD-10-CM | POA: Diagnosis not present

## 2021-05-27 DIAGNOSIS — I69391 Dysphagia following cerebral infarction: Secondary | ICD-10-CM | POA: Diagnosis not present

## 2021-05-27 DIAGNOSIS — L89322 Pressure ulcer of left buttock, stage 2: Secondary | ICD-10-CM | POA: Diagnosis not present

## 2021-05-27 DIAGNOSIS — I69351 Hemiplegia and hemiparesis following cerebral infarction affecting right dominant side: Secondary | ICD-10-CM | POA: Diagnosis not present

## 2021-05-27 DIAGNOSIS — Z8616 Personal history of COVID-19: Secondary | ICD-10-CM | POA: Diagnosis not present

## 2021-05-27 DIAGNOSIS — I69318 Other symptoms and signs involving cognitive functions following cerebral infarction: Secondary | ICD-10-CM | POA: Diagnosis not present

## 2021-05-27 DIAGNOSIS — I10 Essential (primary) hypertension: Secondary | ICD-10-CM | POA: Diagnosis not present

## 2021-05-28 DIAGNOSIS — I69318 Other symptoms and signs involving cognitive functions following cerebral infarction: Secondary | ICD-10-CM | POA: Diagnosis not present

## 2021-05-28 DIAGNOSIS — Z8616 Personal history of COVID-19: Secondary | ICD-10-CM | POA: Diagnosis not present

## 2021-05-28 DIAGNOSIS — I69391 Dysphagia following cerebral infarction: Secondary | ICD-10-CM | POA: Diagnosis not present

## 2021-05-28 DIAGNOSIS — I69351 Hemiplegia and hemiparesis following cerebral infarction affecting right dominant side: Secondary | ICD-10-CM | POA: Diagnosis not present

## 2021-05-28 DIAGNOSIS — I10 Essential (primary) hypertension: Secondary | ICD-10-CM | POA: Diagnosis not present

## 2021-05-28 DIAGNOSIS — L89322 Pressure ulcer of left buttock, stage 2: Secondary | ICD-10-CM | POA: Diagnosis not present

## 2021-05-29 DIAGNOSIS — I69351 Hemiplegia and hemiparesis following cerebral infarction affecting right dominant side: Secondary | ICD-10-CM | POA: Diagnosis not present

## 2021-05-29 DIAGNOSIS — I69391 Dysphagia following cerebral infarction: Secondary | ICD-10-CM | POA: Diagnosis not present

## 2021-05-29 DIAGNOSIS — I10 Essential (primary) hypertension: Secondary | ICD-10-CM | POA: Diagnosis not present

## 2021-05-29 DIAGNOSIS — I69318 Other symptoms and signs involving cognitive functions following cerebral infarction: Secondary | ICD-10-CM | POA: Diagnosis not present

## 2021-05-29 DIAGNOSIS — Z8616 Personal history of COVID-19: Secondary | ICD-10-CM | POA: Diagnosis not present

## 2021-05-29 DIAGNOSIS — L89322 Pressure ulcer of left buttock, stage 2: Secondary | ICD-10-CM | POA: Diagnosis not present

## 2021-05-30 DEATH — deceased

## 2021-06-01 ENCOUNTER — Telehealth: Payer: Self-pay | Admitting: Internal Medicine

## 2021-06-01 NOTE — Telephone Encounter (Signed)
New Message:     Patient passed away on Tuesday(06-23-2021). What do they need to do with her device monitor?s

## 2021-06-01 NOTE — Telephone Encounter (Signed)
This patient has an event monitor. She is not a device patient sorry.

## 2021-06-07 ENCOUNTER — Ambulatory Visit: Payer: Medicare Other | Admitting: Internal Medicine

## 2021-06-11 ENCOUNTER — Inpatient Hospital Stay: Payer: Medicare Other | Admitting: Adult Health

## 2021-06-12 ENCOUNTER — Telehealth: Payer: Self-pay | Admitting: Internal Medicine

## 2021-06-12 NOTE — Telephone Encounter (Signed)
Patient sister [name] has questions about what to do with her holter monitor. Her phone number is [phone].

## 2021-06-12 NOTE — Telephone Encounter (Signed)
LMVM- [employer] monitor would have come in a Turquoise blue box which has a prepaid [employer] shipping label on it.  The equipment that needs to be returned are the cell phone, cell phone case, cell phone charger, and two monitor batteries labelled #1 and #2. If you are unable to locate the [employer] box, put equipment in a plastic bag with your Ms. Weimer's name and date of birth.  You can drop that off at our [facility] office and I will make sure it is returned to [place].

## 2021-06-12 NOTE — Telephone Encounter (Signed)
Pts niece is reaching out in regards to a missing heart monitor.. states [employer] is reaching out trying to locate it.. transferred to [facility] for additional information

## 2021-07-26 ENCOUNTER — Ambulatory Visit: Payer: Medicare Other | Admitting: Internal Medicine

## 2022-04-23 IMAGING — RF DG SWALLOWING FUNCTION
12 of 24 series · 12 of 24 positions shown · non-contrast
Comparison: NONE.

CLINICAL DATA: dysphagia

EXAM:
MODIFIED BARIUM SWALLOW
TECHNIQUE: Different consistencies of barium were administered orally to the
patient by the Speech Pathologist. Imaging of the pharynx was
performed in the lateral projection. Kasler Blond NP was
present in the fluoroscopy room during this study, which was
supervised and interpreted by Dr. Moseley, Rishabh.
FLUOROSCOPY TIME:  Fluoroscopy Time:  2 minutes 25 seconds
Radiation Exposure Index (if provided by the fluoroscopic device):
14.65 mGy
Number of Acquired Spot Images: Multiple cine clips.

[Series 2: run · 1 of 133 frames shown (1 of 12)]
[frame 1/133]
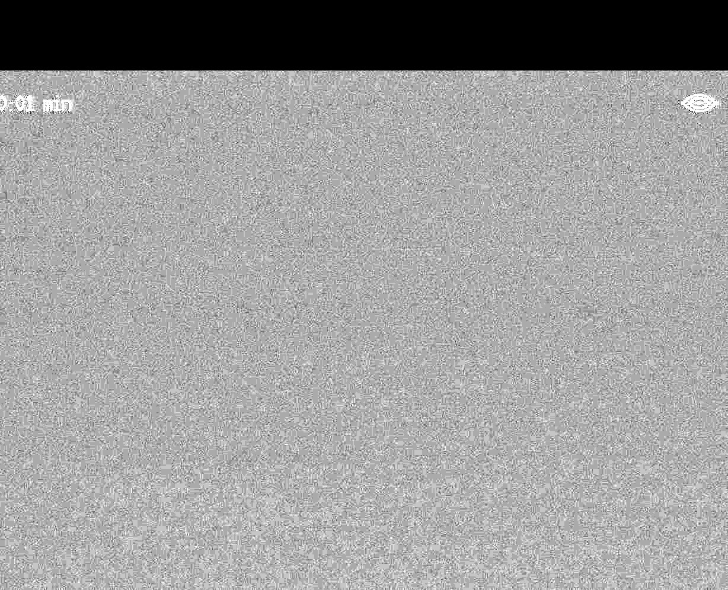

[Series 4: run · 1 of 68 frames shown (2 of 12)]
[frame 11/68]
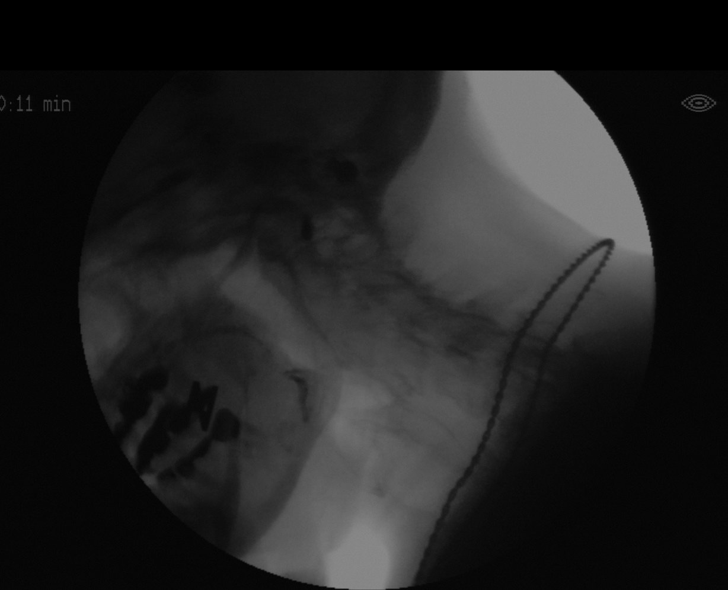

[Series 6: run · 1 of 110 frames shown (3 of 12)]
[frame 17/110]
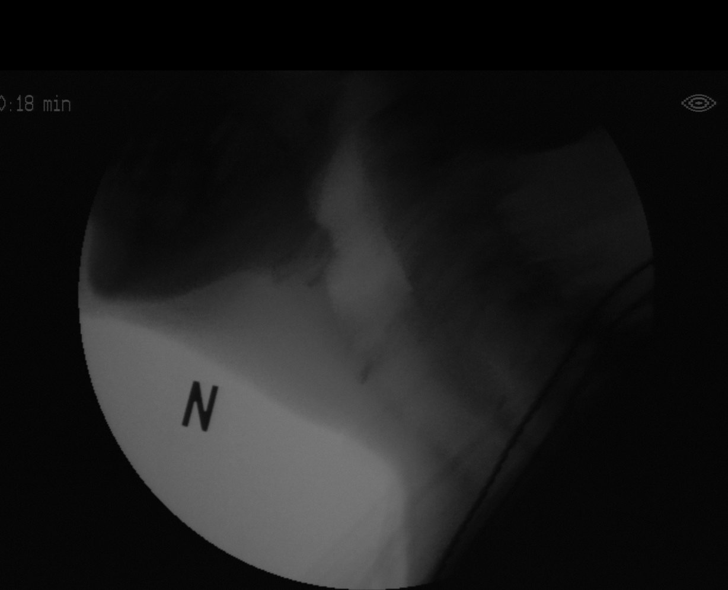

[Series 8: run · 1 of 121 frames shown (4 of 12)]
[frame 61/121]
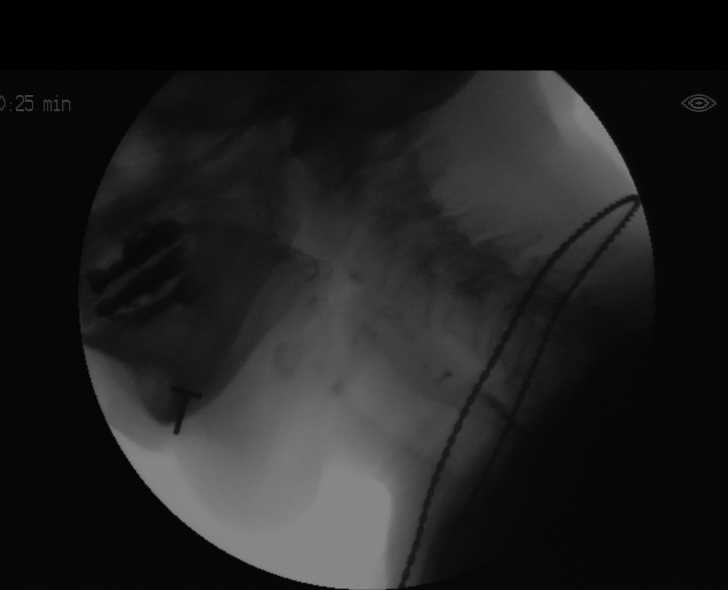

[Series 10: run · 1 of 22 frames shown (5 of 12)]
[frame 5/22]
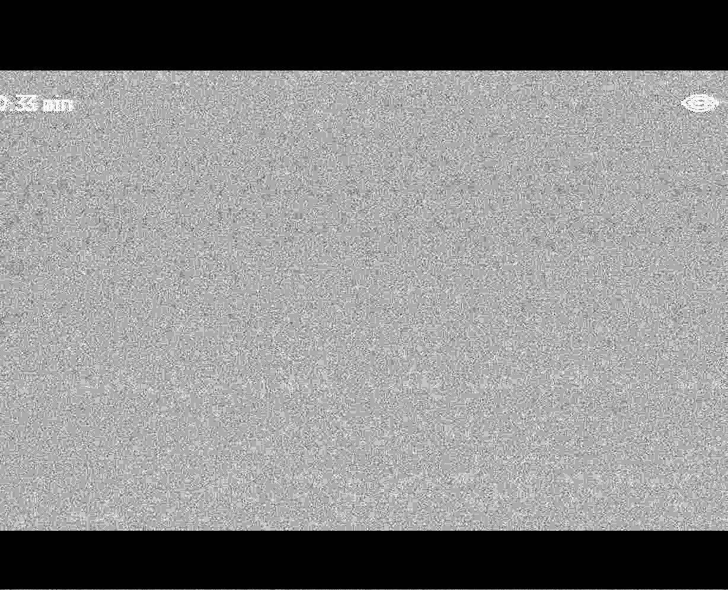

[Series 12: run · 1 of 154 frames shown (6 of 12)]
[frame 24/154]
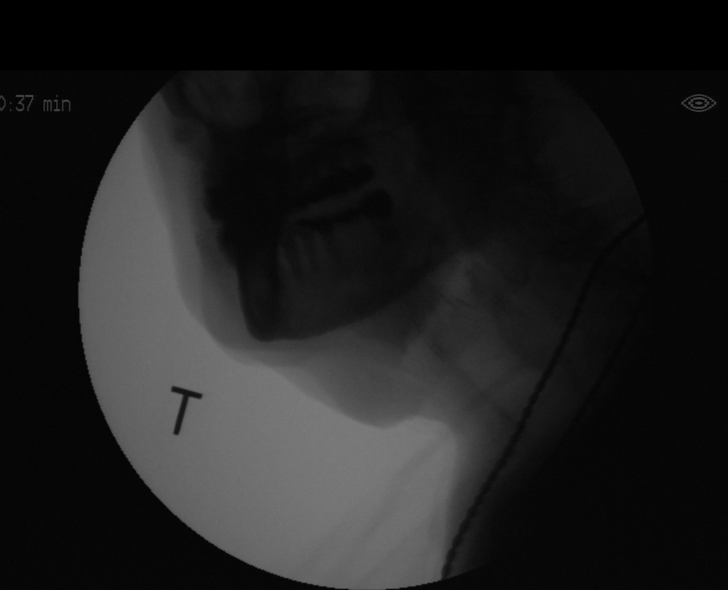

[Series 14: run · 1 of 67 frames shown (7 of 12)]
[frame 57/67]
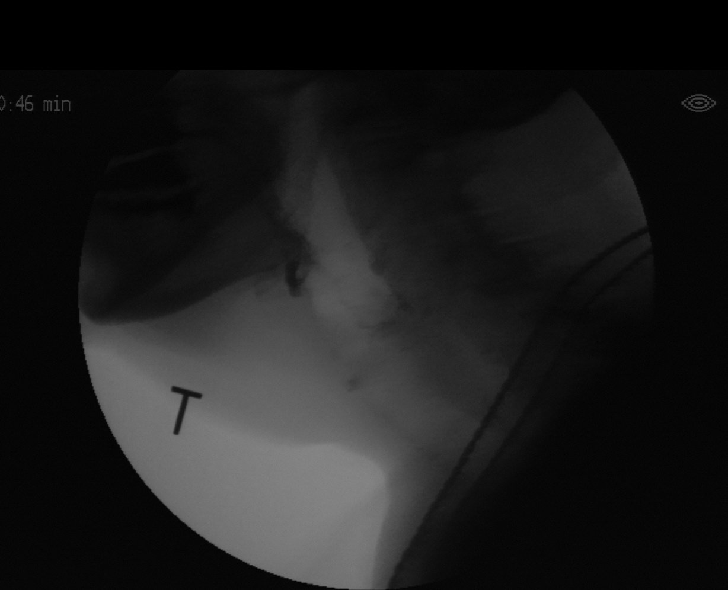

[Series 16: run · 1 of 172 frames shown (8 of 12)]
[frame 87/172]
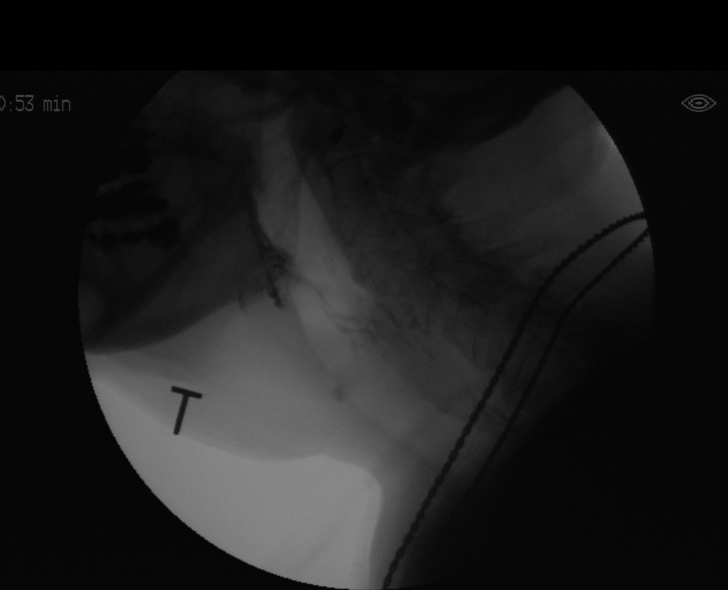

[Series 18: run · 1 of 37 frames shown (9 of 12)]
[frame 19/37]
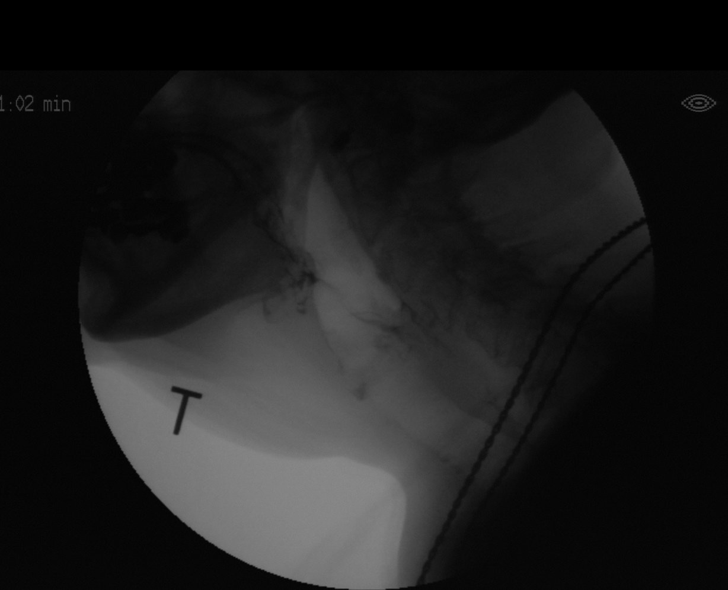

[Series 20: run · 1 of 378 frames shown (10 of 12)]
[frame 190/378]
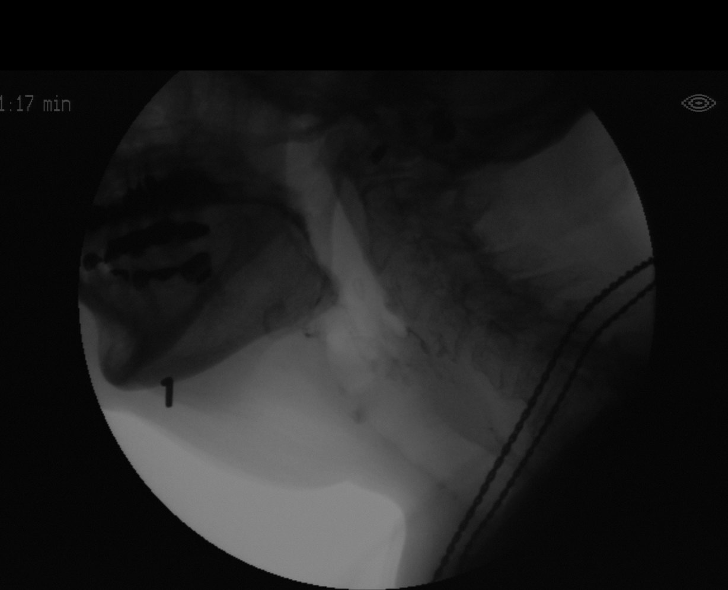

[Series 22: run · 1 of 58 frames shown (11 of 12)]
[frame 50/58]
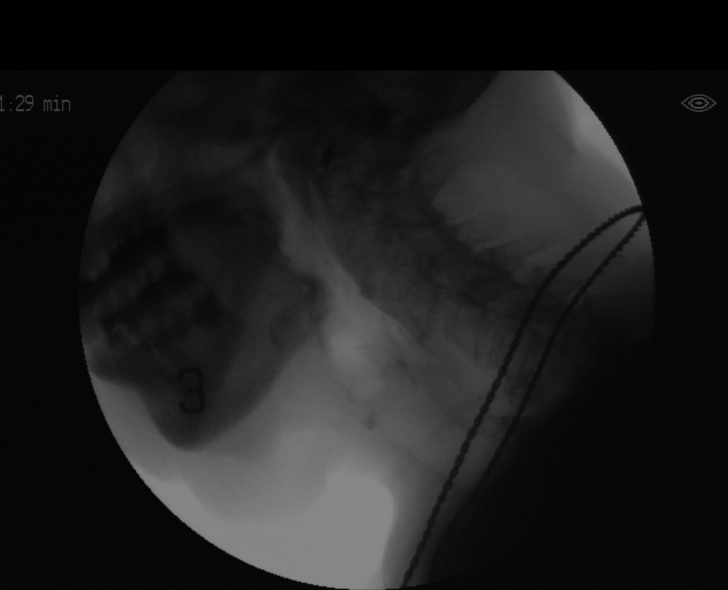

[Series 24: run · 1 of 107 frames shown (12 of 12)]
[frame 91/107]
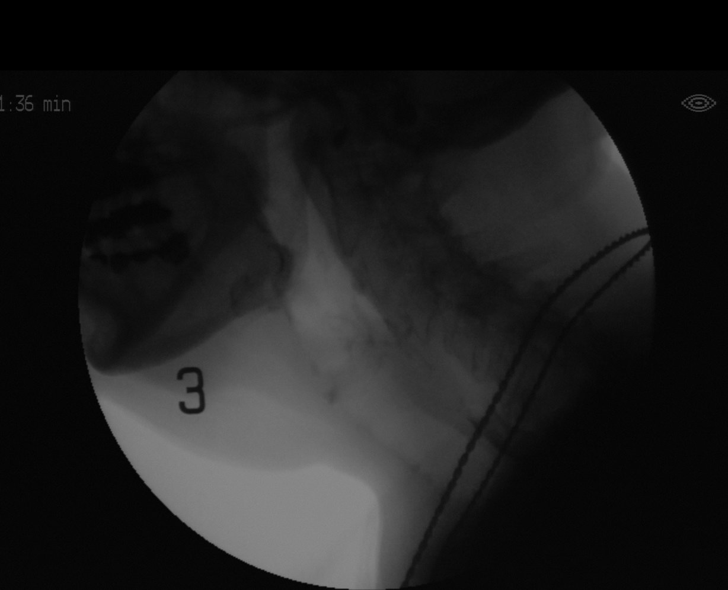

[12 of 24 positions shown; findings below may reference images not displayed]

FINDINGS: Vestibular Penetration: Flash penetration is seen with thin and
nectar thick liquids.

Aspiration:  None seen.

Other:  None.
IMPRESSION: Flash penetration seen with thin and nectar thick liquids. No intra
tracheal aspiration.

Please refer to the Speech Pathologists report for complete details
and recommendations.
